# Patient Record
Sex: Female | Born: 1975 | Race: White | Hispanic: No | Marital: Single | Smoking: Former smoker
Health system: Southern US, Community
[De-identification: ages and names within clinical notes are randomized; demographics above are authoritative.]

## PROBLEM LIST (undated history)

## (undated) DIAGNOSIS — E669 Obesity, unspecified: Secondary | ICD-10-CM

## (undated) DIAGNOSIS — I10 Essential (primary) hypertension: Secondary | ICD-10-CM

## (undated) DIAGNOSIS — G542 Cervical root disorders, not elsewhere classified: Secondary | ICD-10-CM

## (undated) DIAGNOSIS — F419 Anxiety disorder, unspecified: Secondary | ICD-10-CM

## (undated) DIAGNOSIS — J449 Chronic obstructive pulmonary disease, unspecified: Secondary | ICD-10-CM

## (undated) DIAGNOSIS — K589 Irritable bowel syndrome without diarrhea: Secondary | ICD-10-CM

## (undated) DIAGNOSIS — N809 Endometriosis, unspecified: Secondary | ICD-10-CM

## (undated) DIAGNOSIS — Z720 Tobacco use: Secondary | ICD-10-CM

## (undated) DIAGNOSIS — N63 Unspecified lump in unspecified breast: Secondary | ICD-10-CM

## (undated) DIAGNOSIS — E785 Hyperlipidemia, unspecified: Secondary | ICD-10-CM

## (undated) DIAGNOSIS — M722 Plantar fascial fibromatosis: Secondary | ICD-10-CM

## (undated) DIAGNOSIS — F329 Major depressive disorder, single episode, unspecified: Secondary | ICD-10-CM

## (undated) DIAGNOSIS — R609 Edema, unspecified: Secondary | ICD-10-CM

## (undated) DIAGNOSIS — F32A Depression, unspecified: Secondary | ICD-10-CM

## (undated) HISTORY — DX: Irritable bowel syndrome, unspecified: K58.9

## (undated) HISTORY — DX: Hyperlipidemia, unspecified: E78.5

## (undated) HISTORY — DX: Essential (primary) hypertension: I10

## (undated) HISTORY — DX: Tobacco use: Z72.0

## (undated) HISTORY — DX: Plantar fascial fibromatosis: M72.2

## (undated) HISTORY — DX: Unspecified lump in unspecified breast: N63.0

## (undated) HISTORY — PX: CERVICAL BIOPSY  W/ LOOP ELECTRODE EXCISION: SUR135

## (undated) HISTORY — DX: Depression, unspecified: F32.A

## (undated) HISTORY — DX: Anxiety disorder, unspecified: F41.9

## (undated) HISTORY — DX: Chronic obstructive pulmonary disease, unspecified: J44.9

## (undated) HISTORY — DX: Obesity, unspecified: E66.9

## (undated) HISTORY — DX: Cervical root disorders, not elsewhere classified: G54.2

## (undated) HISTORY — PX: BREAST BIOPSY: SHX20

## (undated) HISTORY — DX: Major depressive disorder, single episode, unspecified: F32.9

## (undated) HISTORY — DX: Edema, unspecified: R60.9

---

## 2004-06-29 ENCOUNTER — Ambulatory Visit: Payer: Self-pay | Admitting: Unknown Physician Specialty

## 2005-03-26 HISTORY — PX: LEEP: SHX91

## 2005-03-26 HISTORY — PX: COLPOSCOPY: SHX161

## 2005-08-02 ENCOUNTER — Ambulatory Visit: Payer: Self-pay | Admitting: Unknown Physician Specialty

## 2006-02-24 ENCOUNTER — Emergency Department: Payer: Self-pay | Admitting: Emergency Medicine

## 2007-06-13 ENCOUNTER — Ambulatory Visit (HOSPITAL_COMMUNITY): Admission: RE | Admit: 2007-06-13 | Discharge: 2007-06-13 | Payer: Self-pay | Admitting: Obstetrics and Gynecology

## 2011-03-27 DIAGNOSIS — N63 Unspecified lump in unspecified breast: Secondary | ICD-10-CM

## 2011-03-27 DIAGNOSIS — R609 Edema, unspecified: Secondary | ICD-10-CM

## 2011-03-27 HISTORY — DX: Unspecified lump in unspecified breast: N63.0

## 2011-03-27 HISTORY — DX: Edema, unspecified: R60.9

## 2011-12-17 LAB — HM PAP SMEAR

## 2011-12-31 ENCOUNTER — Ambulatory Visit: Payer: Self-pay | Admitting: Obstetrics and Gynecology

## 2012-05-28 ENCOUNTER — Encounter: Payer: Self-pay | Admitting: *Deleted

## 2012-07-21 ENCOUNTER — Ambulatory Visit: Payer: Self-pay | Admitting: General Surgery

## 2012-07-30 ENCOUNTER — Encounter: Payer: Self-pay | Admitting: *Deleted

## 2013-01-05 ENCOUNTER — Ambulatory Visit: Payer: Self-pay | Admitting: Obstetrics and Gynecology

## 2014-01-25 ENCOUNTER — Encounter: Payer: Self-pay | Admitting: *Deleted

## 2014-05-14 ENCOUNTER — Ambulatory Visit (INDEPENDENT_AMBULATORY_CARE_PROVIDER_SITE_OTHER): Payer: BLUE CROSS/BLUE SHIELD | Admitting: Internal Medicine

## 2014-05-14 VITALS — BP 110/74 | HR 67 | Temp 97.3°F | Resp 16 | Ht 68.5 in | Wt 211.0 lb

## 2014-05-14 DIAGNOSIS — R300 Dysuria: Secondary | ICD-10-CM

## 2014-05-14 DIAGNOSIS — N809 Endometriosis, unspecified: Secondary | ICD-10-CM

## 2014-05-14 DIAGNOSIS — R1031 Right lower quadrant pain: Secondary | ICD-10-CM

## 2014-05-14 DIAGNOSIS — Z6831 Body mass index (BMI) 31.0-31.9, adult: Secondary | ICD-10-CM

## 2014-05-14 LAB — POCT CBC
Granulocyte percent: 66.3 %G (ref 37–80)
HCT, POC: 39.9 % (ref 37.7–47.9)
HEMOGLOBIN: 13.5 g/dL (ref 12.2–16.2)
LYMPH, POC: 2.6 (ref 0.6–3.4)
MCH: 29.2 pg (ref 27–31.2)
MCHC: 33.9 g/dL (ref 31.8–35.4)
MCV: 86.1 fL (ref 80–97)
MID (cbc): 0.7 (ref 0–0.9)
MPV: 6.1 fL (ref 0–99.8)
POC GRANULOCYTE: 6.6 (ref 2–6.9)
POC LYMPH %: 26.4 % (ref 10–50)
POC MID %: 7.3 % (ref 0–12)
Platelet Count, POC: 342 10*3/uL (ref 142–424)
RBC: 4.63 M/uL (ref 4.04–5.48)
RDW, POC: 12.2 %
WBC: 9.9 10*3/uL (ref 4.6–10.2)

## 2014-05-14 LAB — POCT URINALYSIS DIPSTICK
Bilirubin, UA: NEGATIVE
Glucose, UA: NEGATIVE
KETONES UA: NEGATIVE
LEUKOCYTES UA: NEGATIVE
NITRITE UA: NEGATIVE
Protein, UA: NEGATIVE
SPEC GRAV UA: 1.01
Urobilinogen, UA: 0.2
pH, UA: 5.5

## 2014-05-14 LAB — POCT UA - MICROSCOPIC ONLY
Bacteria, U Microscopic: NEGATIVE
Casts, Ur, LPF, POC: NEGATIVE
Crystals, Ur, HPF, POC: NEGATIVE
Mucus, UA: NEGATIVE
WBC, UR, HPF, POC: NEGATIVE
YEAST UA: NEGATIVE

## 2014-05-14 LAB — COMPREHENSIVE METABOLIC PANEL
ALBUMIN: 4.5 g/dL (ref 3.5–5.2)
ALT: 16 U/L (ref 0–35)
AST: 12 U/L (ref 0–37)
Alkaline Phosphatase: 37 U/L — ABNORMAL LOW (ref 39–117)
BUN: 12 mg/dL (ref 6–23)
CALCIUM: 9.3 mg/dL (ref 8.4–10.5)
CHLORIDE: 100 meq/L (ref 96–112)
CO2: 27 meq/L (ref 19–32)
CREATININE: 0.7 mg/dL (ref 0.50–1.10)
Glucose, Bld: 97 mg/dL (ref 70–99)
POTASSIUM: 3.8 meq/L (ref 3.5–5.3)
Sodium: 136 mEq/L (ref 135–145)
Total Bilirubin: 0.8 mg/dL (ref 0.2–1.2)
Total Protein: 7.3 g/dL (ref 6.0–8.3)

## 2014-05-14 LAB — POCT URINE PREGNANCY: Preg Test, Ur: NEGATIVE

## 2014-05-14 NOTE — Progress Notes (Signed)
Subjective:    Patient ID: Stefanie Stewart, female    DOB: 1975/06/02, 39 y.o.   MRN: [id number] This chart was scribed for Stefanie Lin, MD by Marti Sleigh, Medical Scribe. This patient was seen in Room 9 and the patient's care was started a 11:05 AM.  Chief Complaint  Patient presents with  . Abdominal Pain    Lower X1week  . Back Pain    Rt side   . Rash    Ongoing for months    HPI HPI Comments: Stefanie Stewart is a 39 y.o. female with a hx of endometriosis who presents to [facility] complaining of squeezing low abdominal pain for the last week. Pt also states that she started having right-sided mid back pain last night. Pt also states she has had severe constipation for the last several months. Pt also states she has the feeling of pressure during and after urination. Pt endorses sleep disturbance associated with her abdominal pain. Pt denies sharp abdominal pain, appetite disturbance, cough, SOB, fever, chills, night sweats, or burning with urination.  Pt states she takes HCTZ for swelling in her legs but hasn't needed this recently because of her weight loss. Pt states she has lost more than thirty pounds in the last year through exercise and eliminating glutin from her diet.   Pt states she is currently trying to get pregnant. Pt states she has taken clomid during this period cycle. Ovulation was 8 days ago. GYN Dr. Willis Modena. Long history of endometriosis. Last child was 61 years old.  Also has a dry patch slightly itchy - right shoulder one month  Review of Systems  Constitutional: Negative for fever and chills.  Gastrointestinal: Positive for abdominal pain.  Genitourinary: Negative for dysuria.  Musculoskeletal: Positive for back pain.       Objective:   Physical Exam  Constitutional: She is oriented to person, place, and time. She appears well-developed and well-nourished. No distress.  HENT:  Head: Normocephalic and atraumatic.  Eyes: Pupils are equal, round, and reactive  to light.  Neck: Neck supple.  Cardiovascular: Normal rate, regular rhythm and normal heart sounds.   Pulmonary/Chest: Effort normal. No respiratory distress.  Abdominal: Soft. Bowel sounds are normal. She exhibits no distension. There is tenderness. There is no guarding.  TTP suprapubic and RLQ. No rebound or masses. Right flank is not tender to percussion.  Musculoskeletal: Normal range of motion. She exhibits no edema.  Neurological: She is alert and oriented to person, place, and time. Coordination normal.  Skin: Skin is warm and dry. She is not diaphoretic.  Scaly 2 cm x 4 cm patch on the right shoulder with no inflammation or vesiculation and no central clearing  Psychiatric: She has a normal mood and affect. Her behavior is normal.  Nursing note and vitals reviewed. BP 110/74 mmHg  Pulse 67  Temp(Src) 97.3 F (36.3 C) (Oral)  Resp 16  Ht 5' 8.5" (1.74 m)  Wt 211 lb (95.709 kg)  BMI 31.61 kg/m2  SpO2 100%  LMP 04/22/2014 Results for orders placed or performed in visit on 05/14/14  POCT UA - Microscopic Only  Result Value Ref Range   WBC, Ur, HPF, POC neg    RBC, urine, microscopic 0-2    Bacteria, U Microscopic neg    Mucus, UA neg    Epithelial cells, urine per micros 2-5    Crystals, Ur, HPF, POC neg    Casts, Ur, LPF, POC neg    Yeast, UA neg  POCT urinalysis dipstick  Result Value Ref Range   Color, UA yellow    Clarity, UA clear    Glucose, UA neg    Bilirubin, UA neg    Ketones, UA neg    Spec Grav, UA 1.010    Blood, UA trace-intact    pH, UA 5.5    Protein, UA neg    Urobilinogen, UA 0.2    Nitrite, UA neg    Leukocytes, UA Negative   POCT urine pregnancy  Result Value Ref Range   Preg Test, Ur Negative   POCT CBC  Result Value Ref Range   WBC 9.9 4.6 - 10.2 K/uL   Lymph, poc 2.6 0.6 - 3.4   POC LYMPH PERCENT 26.4 10 - 50 %L   MID (cbc) 0.7 0 - 0.9   POC MID % 7.3 0 - 12 %M   POC Granulocyte 6.6 2 - 6.9   Granulocyte percent 66.3 37 - 80 %G    RBC 4.63 4.04 - 5.48 M/uL   Hemoglobin 13.5 12.2 - 16.2 g/dL   HCT, POC 39.9 37.7 - 47.9 %   MCV 86.1 80 - 97 fL   MCH, POC 29.2 27 - 31.2 pg   MCHC 33.9 31.8 - 35.4 g/dL   RDW, POC 12.2 %   Platelet Count, POC 342 142 - 424 K/uL   MPV 6.1 0 - 99.8 fL        Assessment & Plan:  Dysuria - Plan: POCT UA - Microscopic Only, POCT urinalysis dipstick=wnl  RLQ abdominal pain - Plan: POCT urine pregnancy=neg, POCT CBC=wnl,  Comprehensive metabolic panel, hCG, quantitative, pregnancy(due to clomid and hx ovul 8 d ago  Endometriosis  BMI 31.0-31.9,adult  Plan-cmet/quant hcg--if neg consider abdominal ultrasound or CT of the abdomen Until then MiraLAX twice a day   I have completed the patient encounter in its entirety as documented by the scribe, with editing by me where necessary. Beren Yniguez P. Laney Pastor, M.D.

## 2014-05-15 ENCOUNTER — Telehealth: Payer: Self-pay | Admitting: *Deleted

## 2014-05-15 LAB — HCG, QUANTITATIVE, PREGNANCY: hCG, Beta Chain, Quant, S: <2 m[IU]/mL

## 2014-05-15 NOTE — Telephone Encounter (Signed)
Pt called to inquire about labs , looks like they have yet to be reviewed, please advise.

## 2014-10-21 ENCOUNTER — Ambulatory Visit (INDEPENDENT_AMBULATORY_CARE_PROVIDER_SITE_OTHER): Payer: Self-pay | Admitting: Family Medicine

## 2014-10-21 VITALS — BP 122/70 | HR 82 | Temp 98.2°F | Resp 18 | Ht 67.5 in | Wt 217.0 lb

## 2014-10-21 DIAGNOSIS — R42 Dizziness and giddiness: Secondary | ICD-10-CM

## 2014-10-21 DIAGNOSIS — R110 Nausea: Secondary | ICD-10-CM

## 2014-10-21 LAB — POCT CBC
GRANULOCYTE PERCENT: 64.5 % (ref 37–80)
HEMATOCRIT: 39.6 % (ref 37.7–47.9)
HEMOGLOBIN: 13.4 g/dL (ref 12.2–16.2)
Lymph, poc: 3.1 (ref 0.6–3.4)
MCH: 29.2 pg (ref 27–31.2)
MCHC: 33.9 g/dL (ref 31.8–35.4)
MCV: 86.2 fL (ref 80–97)
MID (cbc): 0.7 (ref 0–0.9)
MPV: 5.9 fL (ref 0–99.8)
PLATELET COUNT, POC: 338 10*3/uL (ref 142–424)
POC GRANULOCYTE: 7 — AB (ref 2–6.9)
POC LYMPH %: 28.7 % (ref 10–50)
POC MID %: 6.8 % (ref 0–12)
RBC: 4.59 M/uL (ref 4.04–5.48)
RDW, POC: 12.3 %
WBC: 10.9 10*3/uL — AB (ref 4.6–10.2)

## 2014-10-21 LAB — POCT URINALYSIS DIPSTICK
BILIRUBIN UA: NEGATIVE
Glucose, UA: NEGATIVE
Ketones, UA: NEGATIVE
Leukocytes, UA: NEGATIVE
NITRITE UA: NEGATIVE
PH UA: 5.5
PROTEIN UA: NEGATIVE
Spec Grav, UA: <=1.005
UROBILINOGEN UA: 0.2

## 2014-10-21 NOTE — Progress Notes (Signed)
Subjective:    Patient ID: Stefanie Stewart, female    DOB: 02/22/1976, 39 y.o.   MRN: [id number]  HPI This is a pleasant 39 yo female who presents today with 3 day history of nausea, dizziness, bloating, fatigue. Symptoms started with morning waves of nausea. Feels "swimmy headed, swimmy stomach." Nausea and lightheadedness can be separate. Lightheadedness is not positional. Had maybe 10 episodes today. Can not pinpoint triggers. Has slightly decreased appetite, but has eaten normally today (had tuna salad). No change in symptoms with eating. Has been able to do all normal activities. No recent viral illness. No known sick contacts.   Had a heavier than normal period 09/30/14. Has done two cycles of IUI in an attempt to become pregnant. Last cycle last month. Has lost her health insurance and currently has suspended infertility treatments.   Past Medical History  Diagnosis Date  . Lump or mass in breast 2013    left   . Edema 2013    legs   Past Surgical History  Procedure Laterality Date  . Leep  2007  . Cesarean section  1996   Family History  Problem Relation Age of Onset  . Breast cancer Maternal Aunt   . COPD Mother    History  Substance Use Topics  . Smoking status: Current Every Day Smoker -- 1.00 packs/day for 20 years    Types: E-cigarettes  . Smokeless tobacco: Not on file  . Alcohol Use: 1.2 oz/week    2 Standard drinks or equivalent per week   Review of Systems No chest pain or SOB, no fever, no rash, no abdominal pain, some loose stools today, no diarrhea or unusual colored stools.   No breast tenderness. No headache. No weakness. A little fatigued feeling. No dysuria/hematuria/frequency. No polyuria/polyphagia/polydipsia. No myalgias/arthralgias.  Objective:   Physical Exam  Constitutional: She is oriented to person, place, and time. She appears well-developed and well-nourished.  HENT:  Head: Normocephalic and atraumatic.  Right Ear: Tympanic membrane, external  ear and ear canal normal.  Left Ear: Tympanic membrane, external ear and ear canal normal.  Nose: Nose normal.  Mouth/Throat: Oropharynx is clear and moist.  Eyes: Pupils are equal, round, and reactive to light.  Neck: Normal range of motion. Neck supple.  Cardiovascular: Normal rate, regular rhythm and normal heart sounds.   Pulmonary/Chest: Effort normal and breath sounds normal.  Abdominal: Soft. Bowel sounds are normal. She exhibits no distension and no mass. There is no tenderness. There is no rebound and no guarding.  Musculoskeletal: Normal range of motion. She exhibits no edema.  Lymphadenopathy:    She has no cervical adenopathy.  Neurological: She is alert and oriented to person, place, and time.  Skin: Skin is warm and dry.  Psychiatric: She has a normal mood and affect. Her behavior is normal. Judgment and thought content normal.  Vitals reviewed.  BP 122/70 mmHg  Pulse 82  Temp(Src) 98.2 F (36.8 C) (Oral)  Resp 18  Ht 5' 7.5" (1.715 m)  Wt 217 lb (98.431 kg)  BMI 33.47 kg/m2  SpO2 99%  LMP 09/30/2014 Results for orders placed or performed in visit on 10/21/14  POCT CBC  Result Value Ref Range   WBC 10.9 (A) 4.6 - 10.2 K/uL   Lymph, poc 3.1 0.6 - 3.4   POC LYMPH PERCENT 28.7 10 - 50 %L   MID (cbc) 0.7 0 - 0.9   POC MID % 6.8 0 - 12 %M   POC Granulocyte  7.0 (A) 2 - 6.9   Granulocyte percent 64.5 37 - 80 %G   RBC 4.59 4.04 - 5.48 M/uL   Hemoglobin 13.4 12.2 - 16.2 g/dL   HCT, POC 39.6 37.7 - 47.9 %   MCV 86.2 80 - 97 fL   MCH, POC 29.2 27 - 31.2 pg   MCHC 33.9 31.8 - 35.4 g/dL   RDW, POC 12.3 %   Platelet Count, POC 338 142 - 424 K/uL   MPV 5.9 0 - 99.8 fL  POCT urinalysis dipstick  Result Value Ref Range   Color, UA yellow    Clarity, UA clear    Glucose, UA neg    Bilirubin, UA neg    Ketones, UA neg    Spec Grav, UA <=1.005    Blood, UA trace-lysed    pH, UA 5.5    Protein, UA neg    Urobilinogen, UA 0.2    Nitrite, UA neg    Leukocytes, UA  Negative Negative      Assessment & Plan:  1. Dizziness - hCG, quantitative, pregnancy - POCT CBC - POCT urinalysis dipstick  2. Nausea without vomiting - hCG, quantitative, pregnancy - POCT CBC - POCT urinalysis dipstick  - Reviewed CBC and urine dip results  - I will call her tomorrow with her serum HCG level and can send in ondansetron if she is still having nausea.   Clarene Reamer, FNP-BC  [facility]  10/21/2014 9:37 PM

## 2014-10-21 NOTE — Patient Instructions (Signed)
Continue to hydrate well We will call you tomorrow with pregnancy test results- if negative, I can send in medication for nausea

## 2014-10-22 LAB — HCG, QUANTITATIVE, PREGNANCY

## 2014-11-29 ENCOUNTER — Ambulatory Visit (INDEPENDENT_AMBULATORY_CARE_PROVIDER_SITE_OTHER): Payer: Self-pay | Admitting: Urgent Care

## 2014-11-29 VITALS — BP 118/70 | HR 83 | Temp 100.2°F | Resp 16 | Ht 69.0 in | Wt 218.0 lb

## 2014-11-29 DIAGNOSIS — H9202 Otalgia, left ear: Secondary | ICD-10-CM

## 2014-11-29 DIAGNOSIS — H60392 Other infective otitis externa, left ear: Secondary | ICD-10-CM

## 2014-11-29 MED ORDER — NEOMYCIN-POLYMYXIN-HC 1 % OT SOLN: 3.0000 [drp] | OTIC | Status: DC

## 2014-11-29 MED ORDER — TOBRAMYCIN 0.3 % OP SOLN: 1.0000 [drp] | Freq: Four times a day (QID) | OPHTHALMIC | Status: DC

## 2014-11-29 NOTE — Patient Instructions (Addendum)

## 2014-11-29 NOTE — Progress Notes (Signed)
    MRN: [id number] DOB: [birth year]  Subjective:   Stefanie Stewart is a 39 y.o. female presenting for chief complaint of Ear Pain  Reports 3 day history of worsening left ear pain. Has tried peroxide with minimal relief. Denies fever, ear drainage, sinus pain, sinus congestion, cough, sore throat, itchy or watery red eyes. Patient is worried she has an infections and "wants to get rid of this before she goes back to work" and would also like [name] to "drain her ear". She denies history of allergies. Denies any other aggravating or relieving factors, no other questions or concerns.  Stefanie Stewart has a current medication list which includes the following prescription(s): bupropion, cyclobenzaprine, fish oil-omega-3 fatty acids, folic acid, hydrochlorothiazide, multiple vitamins-minerals, and tobramycin. Also has No Known Allergies.  Stefanie Stewart  has a past medical history of Lump or mass in breast (2013) and Edema (2013). Also  has past surgical history that includes LEEP (2007) and Cesarean section (1996).  Objective:   Vitals: BP 118/70 mmHg  Pulse 83  Temp(Src) 100.2 F (37.9 C)  Resp 16  Ht 5\' 9"  (1.753 m)  Wt 218 lb (98.884 kg)  BMI 32.18 kg/m2  SpO2 98%  LMP 11/19/2014  Physical Exam  Constitutional: She is oriented to person, place, and time. She appears well-developed and well-nourished.  HENT:  TM's intact bilaterally, no effusions or erythema. Pain out of proportion to physical exam findings for right auricle and tragus. Nares patent, nasal turbinates pink and moist, nasal passages patent. No sinus tenderness. Oropharynx clear, mucous membranes moist, dentition in good repair.  Eyes: Conjunctivae and EOM are normal. Right eye exhibits no discharge. Left eye exhibits no discharge. No scleral icterus.  Cardiovascular: Normal rate.   Pulmonary/Chest: Effort normal.  Lymphadenopathy:    She has no cervical adenopathy.  Neurological: She is alert and oriented to person, place, and time.  Skin:  Skin is warm and dry. No rash noted. No erythema. No pallor.   Assessment and Plan :   1. Otitis, externa, infective, left 2. Left ear pain - Advised patient that draining her ear would be extremely aggressive for possible otitis externa. It seemed that patient wanted to use antibiotics prophylactically. I prescribed an antibiotic ear drop and refused oral antibiotic course unless she worsens or does not improve over the next 3-4 days. I recommended patient use over-the-counter NSAID treatment for her pain. I also advised that she use Zyrtec for antihistamine properties.  Jaynee Eagles, PA-C [facility] [phone] 11/29/2014 11:18 AM

## 2014-12-01 ENCOUNTER — Telehealth: Payer: Self-pay

## 2014-12-01 MED ORDER — CIPROFLOXACIN HCL 500 MG PO TABS: 500.0000 mg | ORAL_TABLET | Freq: Two times a day (BID) | ORAL | Status: DC

## 2014-12-01 NOTE — Telephone Encounter (Signed)
Patient has had no improvement in her ear pain. She wanted oral antibiotics initially. Her physical exam findings were pretty normal. I had a discussion with her about this and the fact that oral antibiotics are inappropriate for her at this point. But she insists that she wants to "use a strong antibiotic (not amoxicillin) to knock this out". I advised that this may or may not help given her physical exam findings and if she has no improvement, she needs to rtc in 2 days. Continue using ibuprofen or Tylenol for ear pain, Zyrtec for antihistamine properties to try and attempt something for ETD.

## 2014-12-01 NOTE — Telephone Encounter (Signed)
1. Otitis, externa, infective, left 2. Left ear pain - Advised patient that draining her ear would be extremely aggressive for possible otitis externa. It seemed that patient wanted to use antibiotics prophylactically. I prescribed an antibiotic ear drop and refused oral antibiotic course unless she worsens or does not improve over the next 3-4 days. I recommended patient use over-the-counter NSAID treatment for her pain. I also advised that she use Zyrtec for antihistamine properties.  Jaynee Eagles, PA-C [facility] [phone] 11/29/2014 11:18 AM  Pt called stating her ear is still throbbing, more pain, and ear drops are not working. She is unable to RTC because she is cash pay. Can we send in an ABX, she wants something stronger than Amoxicillin. She had to pay 60 dollars for the drops. Please advise.  [place], [place]

## 2014-12-02 ENCOUNTER — Telehealth: Payer: Self-pay

## 2014-12-02 DIAGNOSIS — H9209 Otalgia, unspecified ear: Secondary | ICD-10-CM

## 2014-12-02 NOTE — Telephone Encounter (Signed)
See previous message. Pt would like a referral to ENT. Can we refer. Her hearing is now gone in that ear and very swollen. Pt does not want to RTC, just need a referral. I will try to get her in asap if approved. Thank you.

## 2014-12-02 NOTE — Telephone Encounter (Signed)
PATIENT TALK WITH THE AFTER HOURS ANSWERING SERVICE. PATIENT STATES SHE SAW MARIO MANI ON Monday FOR HER (L) EAR BEING STOPPED UP. SHE STILL CAN NOT HEAR OUT OF IT. BEST PHONE [phone] (CELL)  [place]

## 2014-12-02 NOTE — Telephone Encounter (Signed)
Pt has appt tomorrow

## 2014-12-02 NOTE — Telephone Encounter (Signed)
She should stop Cipro if her ear pain is overwhelming. I placed an urgent referral but she might be better off going to the ED and hopefully they will get an ENT consult. Thank you!

## 2014-12-04 ENCOUNTER — Encounter: Payer: Self-pay | Admitting: Emergency Medicine

## 2014-12-04 ENCOUNTER — Emergency Department
Admission: EM | Admit: 2014-12-04 | Discharge: 2014-12-04 | Disposition: A | Discharge status: Home / Self Care | Payer: Self-pay | Attending: Emergency Medicine | Admitting: Emergency Medicine

## 2014-12-04 DIAGNOSIS — N764 Abscess of vulva: Secondary | ICD-10-CM | POA: Insufficient documentation

## 2014-12-04 DIAGNOSIS — Z792 Long term (current) use of antibiotics: Secondary | ICD-10-CM | POA: Insufficient documentation

## 2014-12-04 DIAGNOSIS — Z79899 Other long term (current) drug therapy: Secondary | ICD-10-CM | POA: Insufficient documentation

## 2014-12-04 DIAGNOSIS — Z720 Tobacco use: Secondary | ICD-10-CM | POA: Insufficient documentation

## 2014-12-04 MED ORDER — HYDROCODONE-ACETAMINOPHEN 5-325 MG PO TABS: ORAL_TABLET | ORAL | Status: DC

## 2014-12-04 MED ORDER — SULFAMETHOXAZOLE-TRIMETHOPRIM 800-160 MG PO TABS: 1.0000 | ORAL_TABLET | Freq: Two times a day (BID) | ORAL | Status: DC

## 2014-12-04 MED ORDER — LIDOCAINE-EPINEPHRINE (PF) 1 %-1:200000 IJ SOLN
10.0000 mL | Freq: Once | INTRAMUSCULAR | Status: AC
  Administered 2014-12-04: 10 mL
  Filled 2014-12-04: qty 30

## 2014-12-04 MED ORDER — IBUPROFEN 800 MG PO TABS
800.0000 mg | ORAL_TABLET | Freq: Once | ORAL | Status: AC
  Administered 2014-12-04: 800 mg via ORAL
  Filled 2014-12-04: qty 1

## 2014-12-04 NOTE — ED Notes (Signed)
Pt stated iodoform came out while using bathroom. PA aware. Patient states she is not able to tolerate iodoform packing. PA ok.

## 2014-12-04 NOTE — Discharge Instructions (Signed)
Abscess Care After An abscess (also called a boil or furuncle) is an infected area that contains a collection of pus. Signs and symptoms of an abscess include pain, tenderness, redness, or hardness, or you may feel a moveable soft area under your skin. An abscess can occur anywhere in the body. The infection may spread to surrounding tissues causing cellulitis. A cut (incision) by the surgeon was made over your abscess and the pus was drained out. Gauze may have been packed into the space to provide a drain that will allow the cavity to heal from the inside outwards. The boil may be painful for 5 to 7 days. Most people with a boil do not have high fevers. Your abscess, if seen early, may not have localized, and may not have been lanced. If not, another appointment may be required for this if it does not get better on its own or with medications. HOME CARE INSTRUCTIONS   Only take over-the-counter or prescription medicines for pain, discomfort, or fever as directed by your caregiver.  When you bathe, soak and then remove gauze or iodoform packs at least daily or as directed by your caregiver. You may then wash the wound gently with mild soapy water. Repack with gauze or do as your caregiver directs. SEEK IMMEDIATE MEDICAL CARE IF:   You develop increased pain, swelling, redness, drainage, or bleeding in the wound site.  You develop signs of generalized infection including muscle aches, chills, fever, or a general ill feeling.  An oral temperature above 102 F (38.9 C) develops, not controlled by medication. See your caregiver for a recheck if you develop any of the symptoms described above. If medications (antibiotics) were prescribed, take them as directed. Document Released: 09/28/2004 Document Revised: 06/04/2011 Document Reviewed: 05/26/2007 [facility] Patient Information 2015 [place], [place]. This information is not intended to replace advice given to you by your health care provider. Make sure  you discuss any questions you have with your health care provider.  Incision and Drainage Incision and drainage is a procedure in which a sac-like structure (cystic structure) is opened and drained. The area to be drained usually contains material such as pus, fluid, or blood.  LET YOUR CAREGIVER KNOW ABOUT:   Allergies to medicine.  Medicines taken, including vitamins, herbs, eyedrops, over-the-counter medicines, and creams.  Use of steroids (by mouth or creams).  Previous problems with anesthetics or numbing medicines.  History of bleeding problems or blood clots.  Previous surgery.  Other health problems, including diabetes and kidney problems.  Possibility of pregnancy, if this applies. RISKS AND COMPLICATIONS  Pain.  Bleeding.  Scarring.  Infection. BEFORE THE PROCEDURE  You may need to have an ultrasound or other imaging tests to see how large or deep your cystic structure is. Blood tests may also be used to determine if you have an infection or how severe the infection is. You may need to have a tetanus shot. PROCEDURE  The affected area is cleaned with a cleaning fluid. The cyst area will then be numbed with a medicine (local anesthetic). A small incision will be made in the cystic structure. A syringe or catheter may be used to drain the contents of the cystic structure, or the contents may be squeezed out. The area will then be flushed with a cleansing solution. After cleansing the area, it is often gently packed with a gauze or another wound dressing. Once it is packed, it will be covered with gauze and tape or some other type of  wound dressing. AFTER THE PROCEDURE   Often, you will be allowed to go home right after the procedure.  You may be given antibiotic medicine to prevent or heal an infection.  If the area was packed with gauze or some other wound dressing, you will likely need to come back in 1 to 2 days to get it removed.  The area should heal in about  14 days. Document Released: 09/05/2000 Document Revised: 09/11/2011 Document Reviewed: 05/07/2011 [facility] Patient Information 2015 [place], [place]. This information is not intended to replace advice given to you by your health care provider. Make sure you discuss any questions you have with your health care provider.

## 2014-12-04 NOTE — ED Notes (Signed)
Xylocaine obtained and given to pa

## 2014-12-04 NOTE — ED Provider Notes (Signed)
[facility] Emergency Department Provider Note  ____________________________________________  Time seen: Approximately 3:11 PM  I have reviewed the triage vital signs and the nursing notes.   HISTORY  Chief Complaint Abscess   HPI DELESA KAWA is a 39 y.o. female is here today with complaint of right labial swelling and pain for 6 days. She states she's been doing warm compresses frequently and also sitz baths without any relief. She denies any fever or chills. She states she has had these before very sports for body that usually in the suprapubic area. Currently she is taking Cipro for an ear infection and thought this would help. Currently patient's pain is 7 out of 10. She is requesting ibuprofen due to the fact that narcotics causes lots of constipation and also because she is driving.   Past Medical History  Diagnosis Date  . Lump or mass in breast 2013    left   . Edema 2013    legs    Patient Active Problem List   Diagnosis Date Noted  . Endometriosis 05/14/2014  . BMI 31.0-31.9,adult 05/14/2014    Past Surgical History  Procedure Laterality Date  . Leep  2007  . Cesarean section  1996    Current Outpatient Rx  Name  Route  Sig  Dispense  Refill  . buPROPion (WELLBUTRIN SR) 100 MG 12 hr tablet   Oral   Take 100 mg by mouth 2 (two) times daily.         . ciprofloxacin (CIPRO) 500 MG tablet   Oral   Take 1 tablet (500 mg total) by mouth 2 (two) times daily.   14 tablet   0   . cyclobenzaprine (FLEXERIL) 10 MG tablet      TK 1 T PO HS      2   . fish oil-omega-3 fatty acids 1000 MG capsule   Oral   Take 2 g by mouth daily.         . folic acid (FOLVITE) 1 MG tablet   Oral   Take 1 mg by mouth daily.         . hydrochlorothiazide (HYDRODIURIL) 25 MG tablet   Oral   Take 25 mg by mouth daily.         [name] HYDROcodone-acetaminophen (NORCO/VICODIN) 5-325 MG per tablet      TAKE 1-2 TABLETS EVERY 4-6 HOURS PRN PAIN    30 tablet   0   . Multiple Vitamins-Minerals (MULTIVITAMIN & MINERAL PO)   Oral   Take by mouth.         . NEOMYCIN-POLYMYXIN-HYDROCORTISONE (CORTISPORIN) 1 % SOLN otic solution   Left Ear   Place 3 drops into the left ear every 4 (four) hours.   10 mL   0   . sulfamethoxazole-trimethoprim (BACTRIM DS,SEPTRA DS) 800-160 MG per tablet   Oral   Take 1 tablet by mouth 2 (two) times daily.   20 tablet   0     Allergies Review of patient's allergies indicates no known allergies.  Family History  Problem Relation Age of Onset  . Breast cancer Maternal Aunt   . COPD Mother     Social History Social History  Substance Use Topics  . Smoking status: Current Every Day Smoker -- 1.00 packs/day for 20 years    Types: E-cigarettes  . Smokeless tobacco: None  . Alcohol Use: 1.2 oz/week    2 Standard drinks or equivalent per week    Review of  Systems Constitutional: No fever/chills Eyes: No visual changes. Cardiovascular: Denies chest pain. Respiratory: Denies shortness of breath. Gastrointestinal: No abdominal pain.  No nausea, no vomiting.  Genitourinary: Negative for dysuria. Right labial pain 6 days Musculoskeletal: Negative for back pain. Skin: Negative for rash. Neurological: Negative for headaches, focal weakness or numbness.  10-point ROS otherwise negative.  ____________________________________________   PHYSICAL EXAM:  VITAL SIGNS: ED Triage Vitals  Enc Vitals Group     BP 12/04/14 1429 128/93 mmHg     Pulse Rate 12/04/14 1429 102     Resp 12/04/14 1429 18     Temp 12/04/14 1429 98.3 F (36.8 C)     Temp Source 12/04/14 1429 Oral     SpO2 12/04/14 1429 96 %     Weight 12/04/14 1416 217 lb (98.431 kg)     Height 12/04/14 1416 5\' 8"  (1.727 m)     Head Cir --      Peak Flow --      Pain Score 12/04/14 1416 7     Pain Loc --      Pain Edu? --      Excl. in [place]? --     Constitutional: Alert and oriented. Well appearing and in no acute  distress. Eyes: Conjunctivae are normal. PERRL. EOMI. Head: Atraumatic. Nose: No congestion/rhinnorhea. Neck: No stridor.   Respiratory: Normal respiratory effort.  No retractions.  Gastrointestinal: Soft and nontender. No distention. Genitourinary: Left labia with abscess. Mild erythema present and early pustule forming. No drainage was noted. There is no definite fluctuant area, area is extremely hard and markedly tender. Musculoskeletal: No lower extremity tenderness nor edema.  No joint effusions. Neurologic:  Normal speech and language. No gross focal neurologic deficits are appreciated. No gait instability. Skin:  Skin is warm, dry and intact. No rash noted. See genitourinary exam Psychiatric: Mood and affect are normal. Speech and behavior are normal.  ____________________________________________   LABS (all labs ordered are listed, but only abnormal results are displayed)  Labs Reviewed - No data to display  PROCEDURES  Procedure(s) performed: INCISION AND DRAINAGE Performed by: Johnn Hai Consent: Verbal consent obtained. Risks and benefits: risks, benefits and alternatives were discussed Type: abscess  Body area: Left labia  Anesthesia: local infiltration  Incision was made with a scalpel.  Local anesthetic: lidocaine 1 % with epinephrine  Anesthetic total: 4 ml  Complexity: complex Blunt dissection to break up loculations  Drainage: purulent mixed with blood  Drainage amount: Minimal   Packing material: 1/4 in iodoform gauze  Patient tolerance: Patient tolerated the procedure well with no immediate complications. patient did not tolerate procedure extremely well. She continued to complain about pain however we were unable to give any oral or IM pain medication since there was no driver. She states there is no one who can come pick her up.      Critical Care performed: No  ____________________________________________   INITIAL IMPRESSION /  ASSESSMENT AND PLAN / ED COURSE  Pertinent labs & imaging results that were available during my care of the patient were reviewed by me and considered in my medical decision making (see chart for details).  Patient was told to return to the emergency room for packing removal in 2 days. She was started on Septra DS for 10 days and Norco as needed for pain and patient states she may return to the emergency room for packing removal or she may remove it herself.  She does have a gynecologist in [place]  and was advised to get an appointment if there is not any improvement.   FINAL CLINICAL IMPRESSION(S) / ED DIAGNOSES  Final diagnoses:  Left genital labial abscess      Johnn Hai, PA-C 12/04/14 2057  Harvest Dark, MD 12/05/14 [phone]

## 2014-12-04 NOTE — ED Notes (Signed)
Pt c/o swollen red area to the groin for the past 6 days, no drainage.

## 2015-01-28 ENCOUNTER — Other Ambulatory Visit: Payer: Self-pay | Admitting: Unknown Physician Specialty

## 2015-05-31 ENCOUNTER — Other Ambulatory Visit: Payer: Self-pay | Admitting: Unknown Physician Specialty

## 2015-05-31 NOTE — Telephone Encounter (Signed)
Pt needs check further refills 

## 2015-07-11 DIAGNOSIS — E782 Mixed hyperlipidemia: Secondary | ICD-10-CM

## 2015-07-11 DIAGNOSIS — F17210 Nicotine dependence, cigarettes, uncomplicated: Secondary | ICD-10-CM | POA: Insufficient documentation

## 2015-07-11 DIAGNOSIS — M722 Plantar fascial fibromatosis: Secondary | ICD-10-CM

## 2015-07-11 DIAGNOSIS — K589 Irritable bowel syndrome without diarrhea: Secondary | ICD-10-CM

## 2015-07-11 DIAGNOSIS — I10 Essential (primary) hypertension: Secondary | ICD-10-CM

## 2015-07-11 DIAGNOSIS — Z6828 Body mass index (BMI) 28.0-28.9, adult: Secondary | ICD-10-CM | POA: Insufficient documentation

## 2015-07-11 DIAGNOSIS — R609 Edema, unspecified: Secondary | ICD-10-CM

## 2015-07-11 DIAGNOSIS — J449 Chronic obstructive pulmonary disease, unspecified: Secondary | ICD-10-CM

## 2015-07-11 DIAGNOSIS — Z638 Other specified problems related to primary support group: Secondary | ICD-10-CM | POA: Insufficient documentation

## 2015-07-11 DIAGNOSIS — G542 Cervical root disorders, not elsewhere classified: Secondary | ICD-10-CM

## 2015-07-11 DIAGNOSIS — E669 Obesity, unspecified: Secondary | ICD-10-CM

## 2015-07-11 DIAGNOSIS — M791 Myalgia, unspecified site: Secondary | ICD-10-CM

## 2015-07-11 DIAGNOSIS — R7301 Impaired fasting glucose: Secondary | ICD-10-CM

## 2015-07-11 DIAGNOSIS — Z720 Tobacco use: Secondary | ICD-10-CM

## 2015-07-11 DIAGNOSIS — F411 Generalized anxiety disorder: Secondary | ICD-10-CM

## 2015-07-11 DIAGNOSIS — I152 Hypertension secondary to endocrine disorders: Secondary | ICD-10-CM | POA: Insufficient documentation

## 2015-07-11 DIAGNOSIS — F32A Depression, unspecified: Secondary | ICD-10-CM

## 2015-07-11 DIAGNOSIS — F329 Major depressive disorder, single episode, unspecified: Secondary | ICD-10-CM | POA: Insufficient documentation

## 2015-07-11 DIAGNOSIS — E785 Hyperlipidemia, unspecified: Secondary | ICD-10-CM | POA: Insufficient documentation

## 2015-07-11 NOTE — Telephone Encounter (Signed)
Pt scheduled for 07/13/15 @ 8:45am for medication follow up. Thanks.

## 2015-07-13 ENCOUNTER — Ambulatory Visit: Payer: Self-pay | Admitting: Unknown Physician Specialty

## 2015-07-22 ENCOUNTER — Ambulatory Visit (INDEPENDENT_AMBULATORY_CARE_PROVIDER_SITE_OTHER): Payer: Self-pay | Admitting: Unknown Physician Specialty

## 2015-07-22 ENCOUNTER — Encounter: Payer: Self-pay | Admitting: Unknown Physician Specialty

## 2015-07-22 VITALS — BP 116/77 | HR 73 | Temp 97.3°F | Ht 68.0 in | Wt 229.0 lb

## 2015-07-22 DIAGNOSIS — M531 Cervicobrachial syndrome: Secondary | ICD-10-CM

## 2015-07-22 DIAGNOSIS — I10 Essential (primary) hypertension: Secondary | ICD-10-CM

## 2015-07-22 DIAGNOSIS — F32A Depression, unspecified: Secondary | ICD-10-CM

## 2015-07-22 DIAGNOSIS — F329 Major depressive disorder, single episode, unspecified: Secondary | ICD-10-CM

## 2015-07-22 DIAGNOSIS — G542 Cervical root disorders, not elsewhere classified: Secondary | ICD-10-CM

## 2015-07-22 DIAGNOSIS — R252 Cramp and spasm: Secondary | ICD-10-CM

## 2015-07-22 MED ORDER — BUPROPION HCL ER (SR) 100 MG PO TB12: 100.0000 mg | ORAL_TABLET | Freq: Every day | ORAL | Status: DC

## 2015-07-22 MED ORDER — CYCLOBENZAPRINE HCL 10 MG PO TABS: 10.0000 mg | ORAL_TABLET | Freq: Every day | ORAL | Status: DC | PRN

## 2015-07-22 MED ORDER — HYDROCHLOROTHIAZIDE 25 MG PO TABS: 25.0000 mg | ORAL_TABLET | Freq: Every day | ORAL | Status: DC | PRN

## 2015-07-22 NOTE — Assessment & Plan Note (Signed)
Check CMP and recommended Magnesium supplements

## 2015-07-22 NOTE — Assessment & Plan Note (Addendum)
Out of Wellbutrin and wants to get back on.  Also planning on exercising again

## 2015-07-22 NOTE — Progress Notes (Signed)
BP 116/77 mmHg  Pulse 73  Temp(Src) 97.3 F (36.3 C)  Ht 5\' 8"  (1.727 m)  Wt 229 lb (103.874 kg)  BMI 34.83 kg/m2  SpO2 96%  LMP 07/13/2015 (Exact Date)   Subjective:    Patient ID: Stefanie Stewart, female    DOB: 1975/07/30, 40 y.o.   MRN: [id number]  HPI: Stefanie DINALLO is a 40 y.o. female  Chief Complaint  Patient presents with  . Medication Refill    pt states she needs her wellbutrin and cyclobenzaprine refilled. Patient states she has been having leg cramps very bad and wonders if her potassium is low. Wants to talk to Malachy Mood about it   Hypertension Using medications without difficulty Average home: checking at work once a month and it's a little higher there  No problems or lightheadedness No chest pain with exertion or shortness of breath No Edema Having leg cramps feet and lower legs  Depression Out of Wellbutrin and wants to get back on.   Myofacial pain Takes flexeril at night on occasion    Relevant past medical, surgical, family and social history reviewed and updated as indicated. Interim medical history since our last visit reviewed. Allergies and medications reviewed and updated.  Review of Systems  Per HPI unless specifically indicated above     Objective:    BP 116/77 mmHg  Pulse 73  Temp(Src) 97.3 F (36.3 C)  Ht 5\' 8"  (1.727 m)  Wt 229 lb (103.874 kg)  BMI 34.83 kg/m2  SpO2 96%  LMP 07/13/2015 (Exact Date)  Wt Readings from Last 3 Encounters:  07/22/15 229 lb (103.874 kg)  07/19/14 213 lb (96.616 kg)  12/04/14 218 lb (98.884 kg)    Physical Exam  Constitutional: She is oriented to person, place, and time. She appears well-developed and well-nourished. No distress.  HENT:  Head: Normocephalic and atraumatic.  Eyes: Conjunctivae and lids are normal. Right eye exhibits no discharge. Left eye exhibits no discharge. No scleral icterus.  Neck: Normal range of motion. Neck supple. No JVD present. Carotid bruit is not present.   Cardiovascular: Normal rate, regular rhythm and normal heart sounds.   Pulmonary/Chest: Effort normal and breath sounds normal.  Abdominal: Normal appearance. There is no splenomegaly or hepatomegaly.  Musculoskeletal: Normal range of motion.  Neurological: She is alert and oriented to person, place, and time.  Skin: Skin is warm, dry and intact. No rash noted. No pallor.  Psychiatric: She has a normal mood and affect. Her behavior is normal. Judgment and thought content normal.    Results for orders placed or performed in visit on 07/11/15  HM PAP SMEAR  Result Value Ref Range   HM Pap smear from PP       Assessment & Plan:   Problem List Items Addressed This Visit      Unprioritized   Cervical syndrome    Takes Flexeril on occasion at night      Depression - Primary    Out of Wellbutrin and wants to get back on.  Also planning on exercising again       Relevant Medications   buPROPion (WELLBUTRIN SR) 100 MG 12 hr tablet   Hypertension    Stable, continue present medications.        Relevant Medications   hydrochlorothiazide (HYDRODIURIL) 25 MG tablet   Other Relevant Orders   Comprehensive metabolic panel   Leg cramps    Check CMP and recommended Magnesium supplements  Follow up plan: Return in about 6 months (around 01/21/2016) for needs lipid panel  at that time.

## 2015-07-22 NOTE — Assessment & Plan Note (Signed)
Takes Flexeril on occasion at night

## 2015-07-22 NOTE — Assessment & Plan Note (Signed)
Stable, continue present medications.   

## 2015-07-22 NOTE — Patient Instructions (Signed)
Take Magnesium slupplement.  I take Magnesium Citrate.  Avoid Magnesium Oxide.

## 2015-07-23 LAB — COMPREHENSIVE METABOLIC PANEL
A/G RATIO: 1.6 (ref 1.2–2.2)
ALBUMIN: 4.4 g/dL (ref 3.5–5.5)
ALT: 22 IU/L (ref 0–32)
AST: 16 IU/L (ref 0–40)
Alkaline Phosphatase: 49 IU/L (ref 39–117)
BILIRUBIN TOTAL: 0.4 mg/dL (ref 0.0–1.2)
BUN / CREAT RATIO: 22 (ref 9–23)
BUN: 15 mg/dL (ref 6–24)
CALCIUM: 9.4 mg/dL (ref 8.7–10.2)
CO2: 23 mmol/L (ref 18–29)
Chloride: 100 mmol/L (ref 96–106)
Creatinine, Ser: 0.67 mg/dL (ref 0.57–1.00)
GFR calc non Af Amer: 110 mL/min/{1.73_m2} (ref >59)
GFR, EST AFRICAN AMERICAN: 126 mL/min/{1.73_m2} (ref >59)
Globulin, Total: 2.8 g/dL (ref 1.5–4.5)
Glucose: 85 mg/dL (ref 65–99)
Potassium: 4.8 mmol/L (ref 3.5–5.2)
SODIUM: 139 mmol/L (ref 134–144)
TOTAL PROTEIN: 7.2 g/dL (ref 6.0–8.5)

## 2015-07-25 ENCOUNTER — Encounter: Payer: Self-pay | Admitting: Unknown Physician Specialty

## 2015-07-25 NOTE — Progress Notes (Signed)
Quick Note:  Normal labs. Patient notified by letter. ______ 

## 2015-09-29 ENCOUNTER — Other Ambulatory Visit: Payer: Self-pay | Admitting: Unknown Physician Specialty

## 2016-01-27 ENCOUNTER — Other Ambulatory Visit: Payer: Self-pay | Admitting: Obstetrics and Gynecology

## 2016-01-27 ENCOUNTER — Ambulatory Visit: Payer: Self-pay | Admitting: Unknown Physician Specialty

## 2016-01-27 DIAGNOSIS — R928 Other abnormal and inconclusive findings on diagnostic imaging of breast: Secondary | ICD-10-CM

## 2016-01-31 ENCOUNTER — Telehealth (HOSPITAL_COMMUNITY): Payer: Self-pay | Admitting: *Deleted

## 2016-01-31 NOTE — Telephone Encounter (Signed)
Telephoned patient at home number and patient stated did not have time to do talk and do application. She stated had appointment on Friday to do mammogram. Patient hung up.

## 2016-02-03 ENCOUNTER — Other Ambulatory Visit: Payer: Self-pay

## 2016-02-03 ENCOUNTER — Ambulatory Visit
Admission: RE | Admit: 2016-02-03 | Discharge: 2016-02-03 | Disposition: A | Discharge status: Home / Self Care | Payer: Self-pay | Source: Ambulatory Visit | Attending: Obstetrics and Gynecology | Admitting: Obstetrics and Gynecology

## 2016-02-03 ENCOUNTER — Other Ambulatory Visit: Payer: Self-pay | Admitting: Obstetrics and Gynecology

## 2016-02-03 ENCOUNTER — Ambulatory Visit (HOSPITAL_COMMUNITY)
Admission: RE | Admit: 2016-02-03 | Discharge: 2016-02-03 | Disposition: A | Discharge status: Home / Self Care | Payer: Self-pay | Source: Ambulatory Visit | Attending: Obstetrics and Gynecology | Admitting: Obstetrics and Gynecology

## 2016-02-03 ENCOUNTER — Encounter (HOSPITAL_COMMUNITY): Payer: Self-pay

## 2016-02-03 VITALS — BP 120/82 | Temp 98.4°F | Ht 67.5 in | Wt 220.2 lb

## 2016-02-03 DIAGNOSIS — R928 Other abnormal and inconclusive findings on diagnostic imaging of breast: Secondary | ICD-10-CM

## 2016-02-03 DIAGNOSIS — N6320 Unspecified lump in the left breast, unspecified quadrant: Secondary | ICD-10-CM

## 2016-02-03 DIAGNOSIS — Z1239 Encounter for other screening for malignant neoplasm of breast: Secondary | ICD-10-CM

## 2016-02-03 HISTORY — DX: Endometriosis, unspecified: N80.9

## 2016-02-03 HISTORY — PX: BREAST BIOPSY: SHX20

## 2016-02-03 NOTE — Patient Instructions (Addendum)
Explained breast self awareness to Stefanie Stewart. Patient did not need a Pap smear today due to last Pap smear was in September 2015 per patient. Let her know BCCCP will cover Pap smears every 3 years unless has a history of abnormal Pap smears. Reminded patient her next Pap smear will be due next year and that can have completed in [place]. Told her she can call Sabrina to schedule. Referred patient to the [facility] for a left breast biopsy per recommendation. Appointment scheduled for Friday, February 03, 2016 at 0925. Let [facility] know the [facility] will follow up with her within the next week with results to breast biopsy by phone.Discussed smoking cessation with patient. Referred patient to the [facility] Quitline and gave resources to free smoking cessation classes at [facility]. Stefanie Stewart verbalized understanding.  Stclair Szymborski, Arvil Chaco, RN 11:16 AM

## 2016-02-03 NOTE — Progress Notes (Addendum)
Patient referred to [facility] by the [facility] due to recommending a left breast biopsy. Left breast diagnostic mammogram completed 02/03/2016 and screening mammogram completed at Dr. Paulene Floor office 01/20/2016.  Pap Smear:  Pap smear not completed today. Last Pap smear was in September 2015 at Dr. Paulene Floor office and normal per patient. Per patient has a history of two abnormal Pap smears in 1996 that a repeat Pap smear was completed and between 2008-2010 that a colposcopy and LEEP were completed. Patient stated she has had at least three normal Pap smears since last abnormal Pap smear. No Pap smear results are in EPIC.  Physical exam: Breasts Breasts symmetrical. No skin abnormalities bilateral breasts. No nipple retraction bilateral breasts. No nipple discharge bilateral breasts. No lymphadenopathy. No lumps palpated bilateral breasts. No complaints of pain or tenderness on exam. Referred patient to the [facility] for a left breast biopsy per recommendation. Appointment scheduled for Friday, February 03, 2016 at 0925.        Pelvic/Bimanual No Pap smear completed today since last Pap smear was in September 2015 per patient. Pap smear not indicated per BCCCP guidelines.   Smoking History: Patient is a current smoker. Discussed smoking cessation with patient. Referred patient to the [facility] Quitline and gave resources to free smoking cessation classes at [facility].  Patient Navigation: Patient education provided. Access to services provided for patient through [facility] program.

## 2016-02-03 NOTE — Addendum Note (Signed)
Encounter addended by: Loletta Parish, RN on: 02/03/2016  4:10 PM<BR>    Actions taken: Sign clinical note

## 2016-02-06 ENCOUNTER — Encounter (HOSPITAL_COMMUNITY): Payer: Self-pay | Admitting: *Deleted

## 2016-05-18 ENCOUNTER — Encounter: Payer: Self-pay | Admitting: Unknown Physician Specialty

## 2016-05-18 ENCOUNTER — Ambulatory Visit (INDEPENDENT_AMBULATORY_CARE_PROVIDER_SITE_OTHER): Payer: Self-pay | Admitting: Unknown Physician Specialty

## 2016-05-18 DIAGNOSIS — F321 Major depressive disorder, single episode, moderate: Secondary | ICD-10-CM

## 2016-05-18 MED ORDER — SERTRALINE HCL 50 MG PO TABS: 50.0000 mg | ORAL_TABLET | Freq: Every day | ORAL | 1 refills | Status: DC

## 2016-05-18 NOTE — Assessment & Plan Note (Signed)
Start Zoloft at 50 mg.  Discussed side-effects

## 2016-05-18 NOTE — Progress Notes (Signed)
BP 131/83 (BP Location: Left Arm, Patient Position: Sitting, Cuff Size: Large)   Pulse 92   Temp 99 F (37.2 C)   Wt 225 lb (102.1 kg)   LMP 04/30/2016 (Approximate)   SpO2 95%   BMI 34.72 kg/m    Subjective:    Patient ID: Stefanie Stewart, female    DOB: 05-27-1975, 41 y.o.   MRN: [id number]  HPI: Stefanie Stewart is a 41 y.o. female  Chief Complaint  Patient presents with  . Anxiety    pt states she has been having trouble with anxiety and panic attacks, states her father passed away and mother has dementia    Depression/Anxiety Her father died last fall.  She has had a lot of stress with care giving with her parents with dementia.  Pt states she would like something to help.  States she is "pretty miserable."    Depression screen [facility] 2/9 05/18/2016 07/22/2015 11/29/2014 10/21/2014  Decreased Interest 2 1 0 0  Down, Depressed, Hopeless 2 0 0 0  PHQ - 2 Score 4 1 0 0  Altered sleeping 1 2 - -  Tired, decreased energy 2 1 - -  Change in appetite 3 3 - -  Feeling bad or failure about yourself  2 1 - -  Trouble concentrating 2 0 - -  Moving slowly or fidgety/restless 0 0 - -  Suicidal thoughts 0 0 - -  PHQ-9 Score 14 8 - -    Relevant past medical, surgical, family and social history reviewed and updated as indicated. Interim medical history since our last visit reviewed. Allergies and medications reviewed and updated.  Review of Systems  Constitutional: Negative.   HENT: Negative.   Eyes: Negative.   Respiratory: Negative.   Cardiovascular: Negative.   Gastrointestinal: Negative.   Endocrine: Negative.   Genitourinary: Negative.   Musculoskeletal: Negative.   Skin: Negative.   Allergic/Immunologic: Negative.   Neurological: Negative.   Hematological: Negative.     Per HPI unless specifically indicated above     Objective:    BP 131/83 (BP Location: Left Arm, Patient Position: Sitting, Cuff Size: Large)   Pulse 92   Temp 99 F (37.2 C)   Wt 225 lb (102.1 kg)    LMP 04/30/2016 (Approximate)   SpO2 95%   BMI 34.72 kg/m   Wt Readings from Last 3 Encounters:  05/18/16 225 lb (102.1 kg)  02/03/16 220 lb 3.2 oz (99.9 kg)  07/22/15 229 lb (103.9 kg)    Physical Exam  Constitutional: She is oriented to person, place, and time. She appears well-developed and well-nourished. No distress.  HENT:  Head: Normocephalic and atraumatic.  Eyes: Conjunctivae and lids are normal. Right eye exhibits no discharge. Left eye exhibits no discharge. No scleral icterus.  Neck: Normal range of motion. Neck supple. No JVD present. Carotid bruit is not present.  Cardiovascular: Normal rate, regular rhythm and normal heart sounds.   Pulmonary/Chest: Effort normal and breath sounds normal.  Abdominal: Normal appearance. There is no splenomegaly or hepatomegaly.  Musculoskeletal: Normal range of motion.  Neurological: She is alert and oriented to person, place, and time.  Skin: Skin is warm, dry and intact. No rash noted. No pallor.  Psychiatric: She has a normal mood and affect. Her behavior is normal. Judgment and thought content normal.      Assessment & Plan:   Problem List Items Addressed This Visit      Unprioritized   Depression  Start Zoloft at 50 mg.  Discussed side-effects       Relevant Medications   sertraline (ZOLOFT) 50 MG tablet       Follow up plan: Return in about 4 weeks (around 06/15/2016).

## 2016-06-25 ENCOUNTER — Encounter: Payer: Self-pay | Admitting: Unknown Physician Specialty

## 2016-06-25 ENCOUNTER — Ambulatory Visit (INDEPENDENT_AMBULATORY_CARE_PROVIDER_SITE_OTHER): Payer: Self-pay | Admitting: Unknown Physician Specialty

## 2016-06-25 DIAGNOSIS — F321 Major depressive disorder, single episode, moderate: Secondary | ICD-10-CM

## 2016-06-25 MED ORDER — SERTRALINE HCL 50 MG PO TABS: 50.0000 mg | ORAL_TABLET | Freq: Every day | ORAL | 1 refills | Status: DC

## 2016-06-25 NOTE — Assessment & Plan Note (Signed)
Stable, continue present medications.   

## 2016-06-25 NOTE — Progress Notes (Signed)
BP 127/82 (BP Location: Left Arm, Patient Position: Sitting, Cuff Size: Large)   Pulse 82   Temp 98.7 F (37.1 C)   Ht 5\' 8"  (1.727 m)   Wt 228 lb 12.8 oz (103.8 kg)   SpO2 94%   BMI 34.79 kg/m    Subjective:    Patient ID: Stefanie Stewart, female    DOB: 06/07/1975, 41 y.o.   MRN: [id number]  HPI: Stefanie Stewart is a 41 y.o. female  Chief Complaint  Patient presents with  . Depression    4 week f/up   She states she is doing much better with her anxiety since starting on Sertraline.  Feels it helps a lot.  Concerned with long-term effects of both medicine.    Depression screen [facility] 2/9 06/25/2016 05/18/2016 07/22/2015 11/29/2014 10/21/2014  Decreased Interest 1 2 1  0 0  Down, Depressed, Hopeless 1 2 0 0 0  PHQ - 2 Score 2 4 1  0 0  Altered sleeping 0 1 2 - -  Tired, decreased energy 0 2 1 - -  Change in appetite 2 3 3  - -  Feeling bad or failure about yourself  0 2 1 - -  Trouble concentrating 0 2 0 - -  Moving slowly or fidgety/restless 0 0 0 - -  Suicidal thoughts 0 0 0 - -  PHQ-9 Score 4 14 8  - -    Relevant past medical, surgical, family and social history reviewed and updated as indicated. Interim medical history since our last visit reviewed. Allergies and medications reviewed and updated.  Review of Systems  Per HPI unless specifically indicated above     Objective:    BP 127/82 (BP Location: Left Arm, Patient Position: Sitting, Cuff Size: Large)   Pulse 82   Temp 98.7 F (37.1 C)   Ht 5\' 8"  (1.727 m)   Wt 228 lb 12.8 oz (103.8 kg)   SpO2 94%   BMI 34.79 kg/m   Wt Readings from Last 3 Encounters:  06/25/16 228 lb 12.8 oz (103.8 kg)  05/18/16 225 lb (102.1 kg)  02/03/16 220 lb 3.2 oz (99.9 kg)    Physical Exam  Constitutional: She is oriented to person, place, and time. She appears well-developed and well-nourished. No distress.  HENT:  Head: Normocephalic and atraumatic.  Eyes: Conjunctivae and lids are normal. Right eye exhibits no discharge. Left eye  exhibits no discharge. No scleral icterus.  Neck: Normal range of motion. Neck supple. No JVD present. Carotid bruit is not present.  Cardiovascular: Normal rate, regular rhythm and normal heart sounds.   Pulmonary/Chest: Effort normal and breath sounds normal.  Abdominal: Normal appearance. There is no splenomegaly or hepatomegaly.  Musculoskeletal: Normal range of motion.  Neurological: She is alert and oriented to person, place, and time.  Skin: Skin is warm, dry and intact. No rash noted. No pallor.  Psychiatric: She has a normal mood and affect. Her behavior is normal. Judgment and thought content normal.    Results for orders placed or performed in visit on 07/22/15  Comprehensive metabolic panel  Result Value Ref Range   Glucose 85 65 - 99 mg/dL   BUN 15 6 - 24 mg/dL   Creatinine, Ser 0.67 0.57 - 1.00 mg/dL   GFR calc non Af Amer 110 >59 mL/min/1.73   GFR calc Af Amer 126 >59 mL/min/1.73   BUN/Creatinine Ratio 22 9 - 23   Sodium 139 134 - 144 mmol/L   Potassium 4.8 3.5 -  5.2 mmol/L   Chloride 100 96 - 106 mmol/L   CO2 23 18 - 29 mmol/L   Calcium 9.4 8.7 - 10.2 mg/dL   Total Protein 7.2 6.0 - 8.5 g/dL   Albumin 4.4 3.5 - 5.5 g/dL   Globulin, Total 2.8 1.5 - 4.5 g/dL   Albumin/Globulin Ratio 1.6 1.2 - 2.2   Bilirubin Total 0.4 0.0 - 1.2 mg/dL   Alkaline Phosphatase 49 39 - 117 IU/L   AST 16 0 - 40 IU/L   ALT 22 0 - 32 IU/L      Assessment & Plan:   Problem List Items Addressed This Visit      Unprioritized   Depression    Stable, continue present medications.        Relevant Medications   sertraline (ZOLOFT) 50 MG tablet       Follow up plan: Return in about 6 months (around 12/25/2016) for cholesterol, BP, and depression.

## 2016-07-03 ENCOUNTER — Telehealth: Payer: Self-pay | Admitting: Unknown Physician Specialty

## 2016-07-03 NOTE — Telephone Encounter (Signed)
Nikki with [employer] needs someone to call her with an update on records of patient.   [phone]  Nikki--Ref# [id number]  Thanks

## 2016-07-03 NOTE — Telephone Encounter (Signed)
Called and spoke to [place] with [place] (gave him the reference number) and he asked about information on the patient. (see last message. Pt called and requested to have last OV sent to [place]). I told Payden that I had sent the OV note like the patient requested but he asked if I could send it to him directly because they had not received anything yet. He stated that his direct fax number was [phone].

## 2016-07-03 NOTE — Telephone Encounter (Signed)
OV note faxed to [place].

## 2016-07-03 NOTE — Telephone Encounter (Signed)
Patient needs for [name] to fax her last office note to [place] [place] [facility] [place] if you need to speak with her [phone]   Thanks

## 2016-07-04 NOTE — Telephone Encounter (Signed)
OV note faxed to [place].

## 2016-07-13 ENCOUNTER — Other Ambulatory Visit: Payer: Self-pay | Admitting: Unknown Physician Specialty

## 2016-08-15 ENCOUNTER — Other Ambulatory Visit: Payer: Self-pay | Admitting: Unknown Physician Specialty

## 2016-10-18 ENCOUNTER — Other Ambulatory Visit: Payer: Self-pay | Admitting: Family Medicine

## 2016-11-22 ENCOUNTER — Other Ambulatory Visit: Payer: Self-pay | Admitting: Unknown Physician Specialty

## 2016-11-22 NOTE — Telephone Encounter (Signed)
Your patient 

## 2016-12-20 ENCOUNTER — Ambulatory Visit (HOSPITAL_COMMUNITY)
Admission: RE | Admit: 2016-12-20 | Discharge: 2016-12-20 | Disposition: A | Discharge status: Home / Self Care | Payer: Self-pay | Source: Ambulatory Visit | Attending: Obstetrics and Gynecology | Admitting: Obstetrics and Gynecology

## 2016-12-20 ENCOUNTER — Encounter (HOSPITAL_COMMUNITY): Payer: Self-pay

## 2016-12-20 VITALS — BP 130/86 | Ht 68.0 in | Wt 232.5 lb

## 2016-12-20 DIAGNOSIS — N898 Other specified noninflammatory disorders of vagina: Secondary | ICD-10-CM

## 2016-12-20 DIAGNOSIS — Z01419 Encounter for gynecological examination (general) (routine) without abnormal findings: Secondary | ICD-10-CM

## 2016-12-20 NOTE — Progress Notes (Signed)
No complaints today.   Pap Smear: Pap smear completed today. Last Pap smear was in September 2015 at Dr. Paulene Floor office and normal per patient. Per patient has a history of two abnormal Pap smears in 1996 that a repeat Pap smear was completed and between 2008-2010 that a colposcopy and LEEP were completed. Patient stated she has had at least three normal Pap smears since last abnormal Pap smear. No Pap smear results are in EPIC.  Pelvic/Bimanual   Ext Genitalia No lesions, no swelling and no discharge observed on external genitalia.         Vagina Vagina pink and normal texture. No lesions and thick white discharge observed in vagina. Wet prep completed.       Cervix Cervix is present. Cervix pink and of normal texture. Thick white discharge observed on cervical os.    Uterus Uterus is present and palpable. Uterus in normal position and normal size.        Adnexae Bilateral ovaries present and palpable. No tenderness on palpation.          Rectovaginal No rectal exam completed today since patient had no rectal complaints. No skin abnormalities observed on exam.    Smoking History: Patient is a current smoker. Discussed smoking cessation with patient. Referred patient to the [facility] Quitline and gave resources to free smoking cessation classes at [facility].  Patient Navigation: Patient education provided. Access to services provided for patient through [facility] program.

## 2016-12-20 NOTE — Patient Instructions (Addendum)
Explained to Stefanie Stewart that [employer] will cover Pap smears and HPV typing every 5 years unless has a history of abnormal Pap smears. Scheduled patient appointment with [facility] on Thursday, February 07, 2017 at 1500 for clinical breast exam and mammogram. Patient aware of appointment and will be there. Let patient know will follow up with her within the next week with results of Pap smear and wet prep by phone. Discussed smoking cessation with patient. Referred patient to the [facility] Quitline and gave resources to free smoking cessation classes at [facility]. Stefanie Stewart verbalized understanding.  Dilraj Killgore, Arvil Chaco, RN 3:48 PM

## 2016-12-21 ENCOUNTER — Encounter (HOSPITAL_COMMUNITY): Payer: Self-pay | Admitting: *Deleted

## 2016-12-24 LAB — CYTOLOGY - PAP
Bacterial vaginitis: NEGATIVE
CANDIDA VAGINITIS: NEGATIVE
Diagnosis: NEGATIVE
HPV: NOT DETECTED
Trichomonas: NEGATIVE

## 2016-12-25 ENCOUNTER — Telehealth (HOSPITAL_COMMUNITY): Payer: Self-pay | Admitting: *Deleted

## 2016-12-25 NOTE — Telephone Encounter (Signed)
Telephoned patient at home number and left message to return call to [facility] 

## 2016-12-25 NOTE — Telephone Encounter (Signed)
Telephoned patient at home number and advised of negative pap smear results. HPV was negative. Next pap smear due in five years. Patient voiced understanding.

## 2016-12-28 ENCOUNTER — Encounter: Payer: Self-pay | Admitting: Unknown Physician Specialty

## 2016-12-28 ENCOUNTER — Ambulatory Visit (INDEPENDENT_AMBULATORY_CARE_PROVIDER_SITE_OTHER): Payer: Self-pay | Admitting: Unknown Physician Specialty

## 2016-12-28 DIAGNOSIS — F321 Major depressive disorder, single episode, moderate: Secondary | ICD-10-CM

## 2016-12-28 DIAGNOSIS — R252 Cramp and spasm: Secondary | ICD-10-CM

## 2016-12-28 MED ORDER — CYCLOBENZAPRINE HCL 10 MG PO TABS: 10.0000 mg | ORAL_TABLET | Freq: Every day | ORAL | 0 refills | Status: DC

## 2016-12-28 MED ORDER — BUPROPION HCL ER (SR) 100 MG PO TB12: 100.0000 mg | ORAL_TABLET | Freq: Two times a day (BID) | ORAL | 3 refills | Status: DC

## 2016-12-28 MED ORDER — SERTRALINE HCL 50 MG PO TABS: 50.0000 mg | ORAL_TABLET | Freq: Every day | ORAL | 1 refills | Status: DC

## 2016-12-28 MED ORDER — HYDROCHLOROTHIAZIDE 25 MG PO TABS: 25.0000 mg | ORAL_TABLET | Freq: Every day | ORAL | 1 refills | Status: DC | PRN

## 2016-12-28 NOTE — Assessment & Plan Note (Addendum)
Stable, continue present medications. Discussed caregiving options for her mother

## 2016-12-28 NOTE — Progress Notes (Signed)
BP 114/77   Pulse 70   Temp (!) 97 F (36.1 C)   Ht 5' 8.1" (1.73 m)   Wt 228 lb 11.2 oz (103.7 kg)   LMP 12/26/2016 (Approximate)   SpO2 96%   BMI 34.67 kg/m    Subjective:    Patient ID: Stefanie Stewart, female    DOB: 1975-06-23, 41 y.o.   MRN: [id number]  HPI: CHIANTI Stewart is a 41 y.o. female  Chief Complaint  Patient presents with  . Depression   Depression  Pt is here for f/u of anxiety and depression.  Pt feels like she is doing well considering her stressors.    Depression screen [facility] 2/9 12/28/2016 06/25/2016 05/18/2016 07/22/2015 11/29/2014  Decreased Interest 1 1 2 1  0  Down, Depressed, Hopeless 1 1 2  0 0  PHQ - 2 Score 2 2 4 1  0  Altered sleeping 0 0 1 2 -  Tired, decreased energy 1 0 2 1 -  Change in appetite 3 2 3 3  -  Feeling bad or failure about yourself  1 0 2 1 -  Trouble concentrating 1 0 2 0 -  Moving slowly or fidgety/restless 0 0 0 0 -  Suicidal thoughts 0 0 0 0 -  PHQ-9 Score 8 4 14 8  -   Relevant past medical, surgical, family and social history reviewed and updated as indicated. Interim medical history since our last visit reviewed. Allergies and medications reviewed and updated.  Review of Systems  Per HPI unless specifically indicated above     Objective:    BP 114/77   Pulse 70   Temp (!) 97 F (36.1 C)   Ht 5' 8.1" (1.73 m)   Wt 228 lb 11.2 oz (103.7 kg)   LMP 12/26/2016 (Approximate)   SpO2 96%   BMI 34.67 kg/m   Wt Readings from Last 3 Encounters:  12/28/16 228 lb 11.2 oz (103.7 kg)  12/20/16 232 lb 8 oz (105.5 kg)  06/25/16 228 lb 12.8 oz (103.8 kg)    Physical Exam  Constitutional: She is oriented to person, place, and time. She appears well-developed and well-nourished. No distress.  HENT:  Head: Normocephalic and atraumatic.  Eyes: Conjunctivae and lids are normal. Right eye exhibits no discharge. Left eye exhibits no discharge. No scleral icterus.  Cardiovascular: Normal rate.   Pulmonary/Chest: Effort normal.  Abdominal:  Normal appearance. There is no splenomegaly or hepatomegaly.  Musculoskeletal: Normal range of motion.  Neurological: She is alert and oriented to person, place, and time.  Skin: Skin is intact. No rash noted. No pallor.  Psychiatric: She has a normal mood and affect. Her behavior is normal. Judgment and thought content normal.    Results for orders placed or performed during the hospital encounter of 12/20/16  Cytology - PAP  Result Value Ref Range   Adequacy      Satisfactory for evaluation  endocervical/transformation zone component PRESENT.   Diagnosis      NEGATIVE FOR INTRAEPITHELIAL LESIONS OR MALIGNANCY.   Bacterial vaginitis Negative for Bacterial Vaginitis Microorganisms    Candida vaginitis Negative for Candida species    HPV NOT DETECTED    Trichomonas Negative    Material Submitted CervicoVaginal Pap [ThinPrep Imaged]       Assessment & Plan:   Problem List Items Addressed This Visit      Unprioritized   Depression    Stable, continue present medications. Discussed caregiving options for her mother  Relevant Medications   buPROPion (WELLBUTRIN SR) 100 MG 12 hr tablet   sertraline (ZOLOFT) 50 MG tablet   Leg cramps    Refill Flexeril          Follow up plan: Return in about 6 months (around 06/28/2017) for physical.

## 2016-12-28 NOTE — Assessment & Plan Note (Signed)
Refill Flexeril

## 2017-01-18 ENCOUNTER — Other Ambulatory Visit: Payer: Self-pay | Admitting: Obstetrics and Gynecology

## 2017-01-18 DIAGNOSIS — Z1231 Encounter for screening mammogram for malignant neoplasm of breast: Secondary | ICD-10-CM

## 2017-02-07 ENCOUNTER — Encounter (HOSPITAL_COMMUNITY): Payer: Self-pay

## 2017-02-07 ENCOUNTER — Ambulatory Visit
Admission: RE | Admit: 2017-02-07 | Discharge: 2017-02-07 | Disposition: A | Discharge status: Home / Self Care | Payer: No Typology Code available for payment source | Source: Ambulatory Visit | Attending: Obstetrics and Gynecology | Admitting: Obstetrics and Gynecology

## 2017-02-07 ENCOUNTER — Ambulatory Visit (HOSPITAL_COMMUNITY)
Admission: RE | Admit: 2017-02-07 | Discharge: 2017-02-07 | Disposition: A | Discharge status: Home / Self Care | Payer: Self-pay | Source: Ambulatory Visit | Attending: Obstetrics and Gynecology | Admitting: Obstetrics and Gynecology

## 2017-02-07 VITALS — BP 124/80 | Ht 68.0 in | Wt 235.4 lb

## 2017-02-07 DIAGNOSIS — Z1239 Encounter for other screening for malignant neoplasm of breast: Secondary | ICD-10-CM

## 2017-02-07 DIAGNOSIS — Z1231 Encounter for screening mammogram for malignant neoplasm of breast: Secondary | ICD-10-CM

## 2017-02-07 NOTE — Patient Instructions (Signed)
Explained breast self awareness with [name]. Patient did not need a Pap smear today due to last Pap smear was 12/20/2016. Let her know [facility] will cover Pap smears and HPV typing every 5 years unless has a history of abnormal Pap smears. Referred patient to the [facility] for a screening mammogram. Appointment scheduled for Thursday, February 07, 2017 at 1540. Let [facility] know [facility] will follow up with her within the next couple weeks with results of mammogram by letter or phone. [name] verbalized understanding.  [place], Arvil Chaco, RN 2:57 PM

## 2017-02-07 NOTE — Progress Notes (Signed)
No complaints today.   Pap Smear: Pap smear not completed today. Last Pap smear was 12/20/2016 at [facility] and normal with negative HPV. Per patient has a history of two abnormal Pap smears in 1996 that a repeat Pap smear was completed and between 2008-2010 that a colposcopy and LEEP were completed. Patient stated she has had at least three normal Pap smears since last abnormal Pap smear. No Pap smear results are in EPIC.  Physical exam: Breasts Breasts symmetrical. No skin abnormalities bilateral breasts. No nipple retraction bilateral breasts. No nipple discharge bilateral breasts. No lymphadenopathy. No lumps palpated bilateral breasts. No complaints of pain or tenderness on exam. Referred patient to the [facility] for a screening mammogram. Appointment scheduled for Thursday, February 07, 2017 at 1540.       Pelvic/Bimanual No Pap smear completed today since last Pap smear and HPV typing was 12/20/2016. Pap smear not indicated per [facility] guidelines.   Smoking History: Patient is a current e-cigarette smoker. Discussed smoking cessation with patient. Referred patient to the [facility] Quitline and gave resources to free smoking cessation classes at [facility].  Patient Navigation: Patient education provided. Access to services provided for patient through [facility] program.

## 2017-02-08 ENCOUNTER — Encounter (HOSPITAL_COMMUNITY): Payer: Self-pay | Admitting: *Deleted

## 2017-02-28 ENCOUNTER — Encounter (HOSPITAL_COMMUNITY): Payer: Self-pay | Admitting: Emergency Medicine

## 2017-02-28 ENCOUNTER — Emergency Department (HOSPITAL_COMMUNITY)
Admission: EM | Admit: 2017-02-28 | Discharge: 2017-02-28 | Disposition: A | Discharge status: Home / Self Care | Payer: No Typology Code available for payment source | Attending: Emergency Medicine | Admitting: Emergency Medicine

## 2017-02-28 DIAGNOSIS — Z87891 Personal history of nicotine dependence: Secondary | ICD-10-CM | POA: Insufficient documentation

## 2017-02-28 DIAGNOSIS — Z23 Encounter for immunization: Secondary | ICD-10-CM | POA: Insufficient documentation

## 2017-02-28 DIAGNOSIS — T23252A Burn of second degree of left palm, initial encounter: Secondary | ICD-10-CM | POA: Insufficient documentation

## 2017-02-28 DIAGNOSIS — Y999 Unspecified external cause status: Secondary | ICD-10-CM | POA: Insufficient documentation

## 2017-02-28 DIAGNOSIS — I10 Essential (primary) hypertension: Secondary | ICD-10-CM | POA: Insufficient documentation

## 2017-02-28 DIAGNOSIS — Y92017 Garden or yard in single-family (private) house as the place of occurrence of the external cause: Secondary | ICD-10-CM | POA: Insufficient documentation

## 2017-02-28 DIAGNOSIS — Z79899 Other long term (current) drug therapy: Secondary | ICD-10-CM | POA: Insufficient documentation

## 2017-02-28 DIAGNOSIS — Y93H9 Activity, other involving exterior property and land maintenance, building and construction: Secondary | ICD-10-CM | POA: Insufficient documentation

## 2017-02-28 DIAGNOSIS — X030XXA Exposure to flames in controlled fire, not in building or structure, initial encounter: Secondary | ICD-10-CM | POA: Insufficient documentation

## 2017-02-28 DIAGNOSIS — J449 Chronic obstructive pulmonary disease, unspecified: Secondary | ICD-10-CM | POA: Insufficient documentation

## 2017-02-28 DIAGNOSIS — T23202A Burn of second degree of left hand, unspecified site, initial encounter: Secondary | ICD-10-CM

## 2017-02-28 MED ORDER — LIDOCAINE HCL 2 % EX GEL
1.0000 "application " | Freq: Once | CUTANEOUS | Status: AC
  Administered 2017-02-28: 1 via TOPICAL

## 2017-02-28 MED ORDER — TETANUS-DIPHTH-ACELL PERTUSSIS 5-2.5-18.5 LF-MCG/0.5 IM SUSP
0.5000 mL | Freq: Once | INTRAMUSCULAR | Status: AC
Start: 2017-02-28 — End: 2017-02-28
  Administered 2017-02-28: 0.5 mL via INTRAMUSCULAR
  Filled 2017-02-28: qty 0.5

## 2017-02-28 MED ORDER — SILVER SULFADIAZINE 1 % EX CREA
TOPICAL_CREAM | Freq: Once | CUTANEOUS | Status: AC
  Administered 2017-02-28: 1 via TOPICAL
  Filled 2017-02-28: qty 50

## 2017-02-28 MED ORDER — LIDOCAINE HCL 2 % EX GEL
CUTANEOUS | Status: AC
  Administered 2017-02-28: 1 via TOPICAL
  Filled 2017-02-28: qty 11

## 2017-02-28 NOTE — Discharge Instructions (Signed)
Change the dressing every 12 hours. Take tylenol and ibuprofen as needed for pain. Call Dr. Levell July office to schedule follow up for tomorrow.return here for any problems.
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
## 2017-06-28 ENCOUNTER — Encounter: Payer: Self-pay | Admitting: Unknown Physician Specialty

## 2017-08-05 ENCOUNTER — Ambulatory Visit: Payer: Self-pay | Admitting: *Deleted

## 2017-08-05 NOTE — Telephone Encounter (Signed)
Routing to provider, FYI.  

## 2017-08-05 NOTE — Telephone Encounter (Signed)
Thrusday- patient worked- leg swelling. Patient got dizzy- Friday and Saturday- she was so bad that she thought she was going to faint. Patient is eating and drinking.She states she has gained a lot of weight. Patient just wants her provider to know what is going on so that when she comes to her appointment on Friday these things can be addressed. Advised patient we may need to bring her in sooner- patient is very reluctant to miss work- she does not have any days she can miss. Patient reports she has no health insurance and will not go to hospital or call 911 if she has symptoms.  Reason for Disposition . [1] MODERATE dizziness (e.g., interferes with normal activities) AND [2] has NOT been evaluated by physician for this  (Exception: dizziness caused by heat exposure, sudden standing, or poor fluid intake)  Answer Assessment - Initial Assessment Questions 1. DESCRIPTION: "Describe your dizziness."     Felt like head was spinning inside- couldn't move 2. LIGHTHEADED: "Do you feel lightheaded?" (e.g., somewhat faint, woozy, weak upon standing)     Patient is not feeling dizzy now- patient states she does not feel herself now 3. VERTIGO: "Do you feel like either you or the room is spinning or tilting?" (i.e. vertigo)     maybe 4. SEVERITY: "How bad is it?"  "Do you feel like you are going to faint?" "Can you stand and walk?"   - MILD - walking normally   - MODERATE - interferes with normal activities (e.g., work, school)    - SEVERE - unable to stand, requires support to walk, feels like passing out now.      Patient is caregiver for many people- moderate 5. ONSET:  "When did the dizziness begin?"     Friday- patient did not feel good Thursday- she did have swelling in her leg 6. AGGRAVATING FACTORS: "Does anything make it worse?" (e.g., standing, change in head position)     Weight gain, exertion- patient states she doesn't feel good- has excessive sweating spells 7. HEART RATE: "Can you tell me  your heart rate?" "How many beats in 15 seconds?"  (Note: not all patients can do this)       Not sure- patient does not pay attention 8. CAUSE: "What do you think is causing the dizziness?"     Not sure 9. RECURRENT SYMPTOM: "Have you had dizziness before?" If so, ask: "When was the last time?" "What happened that time?"     This started last week, patient has brain fog- she can't remember 10. OTHER SYMPTOMS: "Do you have any other symptoms?" (e.g., fever, chest pain, vomiting, diarrhea, bleeding)       Nauseated- not feeling good-  11. PREGNANCY: "Is there any chance you are pregnant?" "When was your last menstrual period?"       LMP- 5/8 very heavy cycles  Protocols used: DIZZINESS [facility]

## 2017-08-09 ENCOUNTER — Ambulatory Visit (INDEPENDENT_AMBULATORY_CARE_PROVIDER_SITE_OTHER): Payer: Self-pay | Admitting: Unknown Physician Specialty

## 2017-08-09 ENCOUNTER — Encounter: Payer: Self-pay | Admitting: Unknown Physician Specialty

## 2017-08-09 VITALS — BP 136/83 | HR 86 | Temp 98.6°F | Ht 68.0 in | Wt 249.0 lb

## 2017-08-09 DIAGNOSIS — I10 Essential (primary) hypertension: Secondary | ICD-10-CM

## 2017-08-09 DIAGNOSIS — F321 Major depressive disorder, single episode, moderate: Secondary | ICD-10-CM

## 2017-08-09 DIAGNOSIS — Z0000 Encounter for general adult medical examination without abnormal findings: Secondary | ICD-10-CM

## 2017-08-09 DIAGNOSIS — B070 Plantar wart: Secondary | ICD-10-CM

## 2017-08-09 DIAGNOSIS — R42 Dizziness and giddiness: Secondary | ICD-10-CM

## 2017-08-09 DIAGNOSIS — Z720 Tobacco use: Secondary | ICD-10-CM

## 2017-08-09 DIAGNOSIS — Z6837 Body mass index (BMI) 37.0-37.9, adult: Secondary | ICD-10-CM

## 2017-08-09 LAB — UA/M W/RFLX CULTURE, ROUTINE
BILIRUBIN UA: NEGATIVE
Glucose, UA: NEGATIVE
Ketones, UA: NEGATIVE
Leukocytes, UA: NEGATIVE
NITRITE UA: NEGATIVE
PH UA: 7 (ref 5.0–7.5)
Specific Gravity, UA: 1.015 (ref 1.005–1.030)
UUROB: 0.2 mg/dL (ref 0.2–1.0)

## 2017-08-09 LAB — MICROSCOPIC EXAMINATION: WBC UA: NONE SEEN /HPF (ref 0–5)

## 2017-08-09 MED ORDER — BUPROPION HCL ER (XL) 150 MG PO TB24: 150.0000 mg | ORAL_TABLET | Freq: Every day | ORAL | 1 refills | Status: DC

## 2017-08-09 NOTE — Assessment & Plan Note (Signed)
Refer to ENT for further management

## 2017-08-09 NOTE — Assessment & Plan Note (Addendum)
Non-therapeutic doses of Wellbutrin.  Increase Wellbutrin to 150 mg XL

## 2017-08-09 NOTE — Addendum Note (Signed)
Addended by: Kathrine Haddock on: 08/09/2017 03:23 PM   Modules accepted: Orders

## 2017-08-09 NOTE — Assessment & Plan Note (Signed)
Still vaping.  Discussed health risk

## 2017-08-09 NOTE — Assessment & Plan Note (Signed)
Use duct tape.  Pare down with [employer], place duct tape over it for 6 days. Leave off overnight.  Repeat process the next day

## 2017-08-09 NOTE — Assessment & Plan Note (Signed)
Stable, continue present medications.   

## 2017-08-09 NOTE — Progress Notes (Addendum)
BP 136/83   Pulse 86   Temp 98.6 F (37 C) (Oral)   Ht 5\' 8"  (1.727 m)   Wt 249 lb (112.9 kg)   SpO2 98%   BMI 37.86 kg/m    Subjective:    Patient ID: Stefanie Stewart, female    DOB: 03/14/1976, 42 y.o.   MRN: [id number]  HPI: Stefanie Stewart is a 42 y.o. female  Chief Complaint  Patient presents with  . Annual Exam   States she put on a lot of weight lately.  She has had some dizzyness and wonders if it has something to do with her ears.  Had a bad dizzy spell while at the gas station.  Further episodes of dizziness intermittently in the last week but not as bad.  States the it was a spinning sensation.  States the left ear "feels funny."   Hypertension Using medications without difficulty Average home BPs Not checking  No problems or lightheadedness No chest pain with exertion or shortness of breath No Edema   She has had multiple stressors with the death of her dog and a good friend.  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    Social History   Socioeconomic History  . Marital status: Single    Spouse name: Not on file  . Number of children: Not on file  . Years of education: Not on file  . Highest education level: Not on file  Occupational History  . Not on file  Social Needs  . Financial resource strain: Not on file  . Food insecurity:    Worry: Not on file    Inability: Not on file  . Transportation needs:    Medical: Not on file    Non-medical: Not on file  Tobacco Use  . Smoking status: Former Smoker    Packs/day: 0.00    Years: 20.00    Pack years: 0.00  . Smokeless tobacco: Never Used  Substance and Sexual Activity  . Alcohol use: Yes    Alcohol/week: 1.2 oz    Types: 2 Standard drinks or equivalent per week    Comment: on occasion  . Drug use: No  . Sexual activity: Not Currently    Birth control/protection: None  Lifestyle  . Physical activity:    Days per week: 0 days    Minutes per session: 0 min  . Stress: To some extent  Relationships    . Social connections:    Talks on phone: More than three times a week    Gets together: Once a week    Attends religious service: Never    Active member of club or organization: No    Attends meetings of clubs or organizations: Never    Relationship status: Divorced  . Intimate partner violence:    Fear of current or ex partner: No    Emotionally abused: No    Physically abused: No    Forced sexual activity: No  Other Topics Concern  . Not on file  Social History Narrative  . Not on file   Family History  Adopted: Yes  Problem Relation Age of Onset  . COPD Mother   . Breast cancer Neg Hx    Past Medical History:  Diagnosis Date  . Anxiety   . Cervical syndrome   . COPD (chronic obstructive pulmonary disease) ([place])   . Depression   . Edema 2013   legs  . Endometriosis   . Hyperlipidemia   . Hypertension   .  IBS (irritable bowel syndrome)   . Lump or mass in breast 2013   left   . Obesity   . Plantar fibromatosis   . Tobacco abuse    Past Surgical History:  Procedure Laterality Date  . BREAST BIOPSY Right   . CERVICAL BIOPSY  W/ LOOP ELECTRODE EXCISION    . CESAREAN SECTION  1996  . COLPOSCOPY  2007  . LEEP  2007   Relevant past medical, surgical, family and social history reviewed and updated as indicated. Interim medical history since our last visit reviewed. Allergies and medications reviewed and updated.  Review of Systems  Constitutional: Negative.   HENT: Negative.   Respiratory: Negative.   Cardiovascular: Negative.   Gastrointestinal: Negative.   Genitourinary: Negative.   Musculoskeletal: Negative.   Neurological:       Complaining of "brain fog."  Psychiatric/Behavioral: Negative.     Per HPI unless specifically indicated above     Objective:    BP 136/83   Pulse 86   Temp 98.6 F (37 C) (Oral)   Ht 5\' 8"  (1.727 m)   Wt 249 lb (112.9 kg)   SpO2 98%   BMI 37.86 kg/m   Wt Readings from Last 3 Encounters:  08/09/17 249 lb (112.9  kg)  02/07/17 235 lb 6.4 oz (106.8 kg)  12/28/16 228 lb 11.2 oz (103.7 kg)    Physical Exam  Constitutional: She is oriented to person, place, and time. She appears well-developed and well-nourished.  HENT:  Head: Normocephalic and atraumatic.  Eyes: Pupils are equal, round, and reactive to light. Right eye exhibits no discharge. Left eye exhibits no discharge. No scleral icterus.  Neck: Normal range of motion. Neck supple. Carotid bruit is not present. No thyromegaly present.  Cardiovascular: Normal rate, regular rhythm and normal heart sounds. Exam reveals no gallop and no friction rub.  No murmur heard. Pulmonary/Chest: Effort normal and breath sounds normal. No respiratory distress. She has no wheezes. She has no rales. No breast tenderness or discharge.  Abdominal: Soft. Bowel sounds are normal. There is no tenderness. There is no rebound.  Genitourinary: No breast tenderness or discharge.  Musculoskeletal: Normal range of motion.  Lymphadenopathy:    She has no cervical adenopathy.  Neurological: She is alert and oriented to person, place, and time.  Skin: Skin is warm, dry and intact. No rash noted.  Wart bottom or left wart  Psychiatric: She has a normal mood and affect. Her speech is normal and behavior is normal. Judgment and thought content normal. Cognition and memory are normal.       Assessment & Plan:   Problem List Items Addressed This Visit      Unprioritized   Depression    Non-therapeutic doses of Wellbutrin.  Increase Wellbutrin to 150 mg XL      Relevant Medications   buPROPion (WELLBUTRIN XL) 150 MG 24 hr tablet   Hypertension    Stable, continue present medications.        Obesity    Discussed diet and exercise.  Planning on joining weight watchers      Plantar wart - Primary    Use duct tape.  Pare down with [employer], place duct tape over it for 6 days. Leave off overnight.  Repeat process the next day        Tobacco abuse    Still  vaping.  Discussed health risk      Vertigo    Refer to ENT for further management  Relevant Orders   Ambulatory referral to ENT    Other Visit Diagnoses    Annual physical exam       Relevant Orders   CBC with Differential/Platelet   Comprehensive metabolic panel   Lipid Panel w/o Chol/HDL Ratio   UA/M w/rflx Culture, Routine   TSH      HM: Refusing breast exam HIV refused Pap due 2021  Follow up plan: Return in about 6 weeks (around 09/20/2017).

## 2017-08-09 NOTE — Patient Instructions (Addendum)
[facility] [employer]  Use duct tape.  Pare down with [employer], place duct tape over it for 6 days. Leave off overnight.  Repeat process the next day

## 2017-08-09 NOTE — Assessment & Plan Note (Addendum)
Discussed diet and exercise.  Planning on joining [employer]

## 2017-08-10 LAB — CBC WITH DIFFERENTIAL/PLATELET
BASOS ABS: 0 10*3/uL (ref 0.0–0.2)
BASOS: 0 %
EOS (ABSOLUTE): 0.1 10*3/uL (ref 0.0–0.4)
Eos: 1 %
Hematocrit: 38.9 % (ref 34.0–46.6)
Hemoglobin: 12.7 g/dL (ref 11.1–15.9)
Immature Grans (Abs): 0 10*3/uL (ref 0.0–0.1)
Immature Granulocytes: 0 %
LYMPHS ABS: 2.2 10*3/uL (ref 0.7–3.1)
Lymphs: 29 %
MCH: 28.8 pg (ref 26.6–33.0)
MCHC: 32.6 g/dL (ref 31.5–35.7)
MCV: 88 fL (ref 79–97)
Monocytes Absolute: 0.5 10*3/uL (ref 0.1–0.9)
Monocytes: 6 %
NEUTROS ABS: 4.7 10*3/uL (ref 1.4–7.0)
Neutrophils: 64 %
PLATELETS: 346 10*3/uL (ref 150–379)
RBC: 4.41 x10E6/uL (ref 3.77–5.28)
RDW: 13.3 % (ref 12.3–15.4)
WBC: 7.5 10*3/uL (ref 3.4–10.8)

## 2017-08-10 LAB — LIPID PANEL W/O CHOL/HDL RATIO
CHOLESTEROL TOTAL: 247 mg/dL — AB (ref 100–199)
HDL: 32 mg/dL — ABNORMAL LOW (ref >39)
LDL Calculated: 162 mg/dL — ABNORMAL HIGH (ref 0–99)
Triglycerides: 265 mg/dL — ABNORMAL HIGH (ref 0–149)
VLDL CHOLESTEROL CAL: 53 mg/dL — AB (ref 5–40)

## 2017-08-10 LAB — COMPREHENSIVE METABOLIC PANEL
A/G RATIO: 1.6 (ref 1.2–2.2)
ALBUMIN: 4.6 g/dL (ref 3.5–5.5)
ALK PHOS: 49 IU/L (ref 39–117)
ALT: 53 IU/L — AB (ref 0–32)
AST: 32 IU/L (ref 0–40)
BILIRUBIN TOTAL: 0.4 mg/dL (ref 0.0–1.2)
BUN/Creatinine Ratio: 16 (ref 9–23)
BUN: 12 mg/dL (ref 6–24)
CHLORIDE: 103 mmol/L (ref 96–106)
CO2: 21 mmol/L (ref 20–29)
Calcium: 9.3 mg/dL (ref 8.7–10.2)
Creatinine, Ser: 0.76 mg/dL (ref 0.57–1.00)
GFR calc Af Amer: 111 mL/min/{1.73_m2} (ref >59)
GFR calc non Af Amer: 96 mL/min/{1.73_m2} (ref >59)
GLUCOSE: 89 mg/dL (ref 65–99)
Globulin, Total: 2.9 g/dL (ref 1.5–4.5)
POTASSIUM: 4.3 mmol/L (ref 3.5–5.2)
Sodium: 142 mmol/L (ref 134–144)
Total Protein: 7.5 g/dL (ref 6.0–8.5)

## 2017-08-10 LAB — TSH: TSH: 2.22 u[IU]/mL (ref 0.450–4.500)

## 2017-08-12 ENCOUNTER — Encounter: Payer: Self-pay | Admitting: Unknown Physician Specialty

## 2017-08-15 ENCOUNTER — Telehealth: Payer: Self-pay | Admitting: Unknown Physician Specialty

## 2017-08-15 NOTE — Telephone Encounter (Signed)
Spoke with pt and she stated that she stays in [place] with her mom for 2 weeks at a time so she has not gotten the letter. I have printed the letter and will be sending it to  [place] [phone].

## 2017-08-15 NOTE — Telephone Encounter (Signed)
Copied from [place] [id number]. Topic: Quick Communication - See Telephone Encounter >> Aug 15, 2017  9:17 AM Ether Griffins B wrote: CRM for notification. See Telephone encounter for: 08/15/17.  Pt is calling checking on labs and urine results 08/09/17

## 2017-09-25 ENCOUNTER — Ambulatory Visit: Payer: Self-pay | Admitting: Unknown Physician Specialty

## 2017-10-18 ENCOUNTER — Ambulatory Visit (INDEPENDENT_AMBULATORY_CARE_PROVIDER_SITE_OTHER): Payer: Self-pay | Admitting: Unknown Physician Specialty

## 2017-10-18 ENCOUNTER — Encounter: Payer: Self-pay | Admitting: Unknown Physician Specialty

## 2017-10-18 DIAGNOSIS — E782 Mixed hyperlipidemia: Secondary | ICD-10-CM

## 2017-10-18 DIAGNOSIS — I10 Essential (primary) hypertension: Secondary | ICD-10-CM

## 2017-10-18 DIAGNOSIS — R7401 Elevation of levels of liver transaminase levels: Secondary | ICD-10-CM | POA: Insufficient documentation

## 2017-10-18 DIAGNOSIS — R740 Nonspecific elevation of levels of transaminase and lactic acid dehydrogenase [LDH]: Secondary | ICD-10-CM

## 2017-10-18 DIAGNOSIS — F321 Major depressive disorder, single episode, moderate: Secondary | ICD-10-CM

## 2017-10-18 MED ORDER — BUPROPION HCL ER (XL) 150 MG PO TB24: 150.0000 mg | ORAL_TABLET | Freq: Every day | ORAL | 1 refills | Status: DC

## 2017-10-18 MED ORDER — SERTRALINE HCL 50 MG PO TABS: 50.0000 mg | ORAL_TABLET | Freq: Every day | ORAL | 1 refills | Status: DC

## 2017-10-18 MED ORDER — HYDROCHLOROTHIAZIDE 25 MG PO TABS: 25.0000 mg | ORAL_TABLET | Freq: Every day | ORAL | 1 refills | Status: DC | PRN

## 2017-10-18 NOTE — Assessment & Plan Note (Signed)
Discussed weight loss and diet changes

## 2017-10-18 NOTE — Assessment & Plan Note (Signed)
Improved.  Encouraged counseling for boundary setting.

## 2017-10-18 NOTE — Assessment & Plan Note (Signed)
Discussed diet and exercise for cholesterol reduction.  Reviewed last cholesterol levels and recheck in 6 months

## 2017-10-18 NOTE — Assessment & Plan Note (Signed)
Stable, continue present medications.   

## 2017-10-18 NOTE — Progress Notes (Signed)
BP 112/76   Pulse 73   Temp 97.7 F (36.5 C) (Oral)   Ht 5\' 8"  (1.727 m)   Wt 252 lb 12.8 oz (114.7 kg)   SpO2 95%   BMI 38.44 kg/m    Subjective:    Patient ID: Stefanie Stewart, female    DOB: Mar 17, 1976, 42 y.o.   MRN: [id number]  HPI: Stefanie Stewart is a 42 y.o. female  Chief Complaint  Patient presents with  . Depression    6 week f/up   Depression Last visit increased Wellbutrin to 150 mg. Overwhelmed with caregiving responsibilities.  Feels the Wellbutrin has helped  Depression screen [facility] 2/9 10/18/2017 08/09/2017 12/28/2016 06/25/2016 05/18/2016  Decreased Interest 1 2 1 1 2   Down, Depressed, Hopeless 0 2 1 1 2   PHQ - 2 Score 1 4 2 2 4   Altered sleeping 2 0 0 0 1  Tired, decreased energy 1 1 1  0 2  Change in appetite 3 2 3 2 3   Feeling bad or failure about yourself  0 2 1 0 2  Trouble concentrating - 0 1 0 2  Moving slowly or fidgety/restless 0 0 0 0 0  Suicidal thoughts 0 0 0 0 0  PHQ-9 Score 7 9 8 4 14    Hypertension Using medications without difficulty Average home BPs   No problems or lightheadedness No chest pain with exertion or shortness of breath No Edema   Hyperlipidemia The 10-year ASCVD risk score Mikey Bussing DC Jr., et al., 2013) is: 9.6%   Values used to calculate the score:     Age: 23 years     Sex: Female     Is Non-Hispanic African American: No     Diabetic: No     Tobacco smoker: Yes     Systolic Blood Pressure: 824 mmHg     Is BP treated: Yes     HDL Cholesterol: 32 mg/dL     Total Cholesterol: 247 mg/dL   Relevant past medical, surgical, family and social history reviewed and updated as indicated. Interim medical history since our last visit reviewed. Allergies and medications reviewed and updated.  Review of Systems  Per HPI unless specifically indicated above     Objective:    BP 112/76   Pulse 73   Temp 97.7 F (36.5 C) (Oral)   Ht 5\' 8"  (1.727 m)   Wt 252 lb 12.8 oz (114.7 kg)   SpO2 95%   BMI 38.44 kg/m   Wt Readings from  Last 3 Encounters:  10/18/17 252 lb 12.8 oz (114.7 kg)  08/09/17 249 lb (112.9 kg)  02/07/17 235 lb 6.4 oz (106.8 kg)    Physical Exam  Constitutional: She is oriented to person, place, and time. She appears well-developed and well-nourished. No distress.  HENT:  Head: Normocephalic and atraumatic.  Eyes: Conjunctivae and lids are normal. Right eye exhibits no discharge. Left eye exhibits no discharge. No scleral icterus.  Neck: Normal range of motion. Neck supple. No JVD present. Carotid bruit is not present.  Cardiovascular: Normal rate, regular rhythm and normal heart sounds.  Pulmonary/Chest: Effort normal and breath sounds normal.  Abdominal: Normal appearance. There is no splenomegaly or hepatomegaly.  Musculoskeletal: Normal range of motion.  Neurological: She is alert and oriented to person, place, and time.  Skin: Skin is warm, dry and intact. No rash noted. No pallor.  Psychiatric: She has a normal mood and affect. Her behavior is normal. Judgment and thought content normal.  Assessment & Plan:   Problem List Items Addressed This Visit      Unprioritized   Depression    Improved.  Encouraged counseling for boundary setting.        Relevant Medications   sertraline (ZOLOFT) 50 MG tablet   buPROPion (WELLBUTRIN XL) 150 MG 24 hr tablet   Elevated ALT measurement    Discussed weight loss and diet changes      Hypertension    Stable, continue present medications.        Relevant Medications   hydrochlorothiazide (HYDRODIURIL) 25 MG tablet   Mixed hyperlipidemia    Discussed diet and exercise for cholesterol reduction.  Reviewed last cholesterol levels and recheck in 6 months      Relevant Medications   hydrochlorothiazide (HYDRODIURIL) 25 MG tablet       Follow up plan: Return in about 6 months (around 04/20/2018).

## 2017-12-27 ENCOUNTER — Telehealth (HOSPITAL_COMMUNITY): Payer: Self-pay | Admitting: *Deleted

## 2017-12-27 NOTE — Telephone Encounter (Signed)
Patient called and left voicemail. Called patient back. Patient complained of heavy AUB for four weeks. Patient states she has bled through a pad and her clothes in less than an hour. Patient stated she got a prescription called in but needs to follow-up. Patient stated the bleeding has decreased. Patient doesn't have insurance. Advised patient if the bleeding becomes heavy again that she is bleeding through a pad in less than an hour to go to [facility]U. Offered to refer her to the [facility] for follow-up. Told her she will need to complete the financial assistance application. Told patient that someone from the [facility] will call her will the appointment. Patient verbalized understanding.

## 2017-12-30 ENCOUNTER — Other Ambulatory Visit (HOSPITAL_COMMUNITY): Payer: Self-pay | Admitting: *Deleted

## 2017-12-30 DIAGNOSIS — Z1231 Encounter for screening mammogram for malignant neoplasm of breast: Secondary | ICD-10-CM

## 2018-01-10 ENCOUNTER — Telehealth: Payer: Self-pay | Admitting: Unknown Physician Specialty

## 2018-01-10 NOTE — Telephone Encounter (Signed)
-----   Message from Stark Klein sent at 10/18/2017 12:43 PM EDT -----   ----- Message ----- From: Jill Side Sent: 10/18/2017  10:43 AM To: Stark Klein  Return in about 6 months (around 04/20/2018). With jolene fridays

## 2018-01-10 NOTE — Telephone Encounter (Signed)
called pt to schedule an appt. Printing letter to mail as well.

## 2018-01-21 ENCOUNTER — Encounter: Payer: No Typology Code available for payment source | Admitting: Obstetrics & Gynecology

## 2018-01-22 ENCOUNTER — Other Ambulatory Visit: Payer: Self-pay | Admitting: Unknown Physician Specialty

## 2018-01-30 ENCOUNTER — Ambulatory Visit: Payer: Self-pay | Admitting: Obstetrics and Gynecology

## 2018-01-30 ENCOUNTER — Encounter: Payer: Self-pay | Admitting: Obstetrics and Gynecology

## 2018-01-30 DIAGNOSIS — N920 Excessive and frequent menstruation with regular cycle: Secondary | ICD-10-CM | POA: Insufficient documentation

## 2018-01-30 MED ORDER — NORETHINDRONE ACETATE 5 MG PO TABS: 5.0000 mg | ORAL_TABLET | Freq: Every day | ORAL | 5 refills | Status: DC

## 2018-01-30 NOTE — Progress Notes (Signed)
Stefanie Stewart presents for evaluation of menorrhagia. Has been followed for this problem by private OB/GYN ( last seen oct 2019) Pt self pay and unable to proceed with further eval and W/U. Was previously on Aygestin and did well with this but ran out over a month ago.   Pt reports cycles have been irregular, long, heavy and painful this past yr. She has had to use both pads/tampoons and depends for her cycles. Has tried OCP's in the past, Depo Provera ( wt gain). Presently not sexual active. H/O endometriosis, Dx via Laparoscopy.  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 Discussed Tx options with pt and need for additional w/u before proceed ing with Tx. Aygestin refilled to used daily. Pt let us know when financial aid approved and will then proceed with GYN U/S and EMBX.

## 2018-02-02 ENCOUNTER — Other Ambulatory Visit: Payer: Self-pay | Admitting: Unknown Physician Specialty

## 2018-02-03 NOTE — Telephone Encounter (Signed)
Refilled on 01/22/18.

## 2018-03-03 ENCOUNTER — Telehealth (HOSPITAL_COMMUNITY): Payer: Self-pay

## 2018-03-03 NOTE — Telephone Encounter (Signed)
Returned patients phone call. Left message for her to call me back to reschedule her appointment with [facility].

## 2018-03-04 ENCOUNTER — Ambulatory Visit (HOSPITAL_COMMUNITY): Payer: No Typology Code available for payment source

## 2018-04-13 ENCOUNTER — Other Ambulatory Visit: Payer: Self-pay | Admitting: Unknown Physician Specialty

## 2018-04-14 NOTE — Telephone Encounter (Signed)
Courtesy refill until appointment 05/02/18.

## 2018-05-02 ENCOUNTER — Ambulatory Visit: Payer: Self-pay | Admitting: Nurse Practitioner

## 2018-05-04 ENCOUNTER — Encounter: Payer: Self-pay | Admitting: Nurse Practitioner

## 2018-05-06 ENCOUNTER — Other Ambulatory Visit: Payer: Self-pay

## 2018-05-06 ENCOUNTER — Encounter: Payer: Self-pay | Admitting: Nurse Practitioner

## 2018-05-06 ENCOUNTER — Ambulatory Visit: Payer: Self-pay | Admitting: Nurse Practitioner

## 2018-05-06 VITALS — BP 143/89 | HR 101 | Temp 98.5°F | Ht 68.0 in | Wt 277.0 lb

## 2018-05-06 DIAGNOSIS — E782 Mixed hyperlipidemia: Secondary | ICD-10-CM

## 2018-05-06 DIAGNOSIS — F321 Major depressive disorder, single episode, moderate: Secondary | ICD-10-CM

## 2018-05-06 DIAGNOSIS — Z638 Other specified problems related to primary support group: Secondary | ICD-10-CM

## 2018-05-06 DIAGNOSIS — Z6837 Body mass index (BMI) 37.0-37.9, adult: Secondary | ICD-10-CM

## 2018-05-06 DIAGNOSIS — I10 Essential (primary) hypertension: Secondary | ICD-10-CM

## 2018-05-06 MED ORDER — SERTRALINE HCL 50 MG PO TABS: 75.0000 mg | ORAL_TABLET | Freq: Every day | ORAL | 3 refills | Status: DC

## 2018-05-06 NOTE — Assessment & Plan Note (Addendum)
Information provided on DASH diet. F/U in 3 months for labs and monitoring

## 2018-05-06 NOTE — Assessment & Plan Note (Addendum)
Increase sertraline to 75mg  per day for anxiety and depression symptoms. Information provided on [facility] therapy resources and PACE program for patient. F/U in appx 4 weeks.

## 2018-05-06 NOTE — Patient Instructions (Addendum)
Alzheimer Disease Caregiver Guide  Alzheimer disease causes a person to lose the ability to remember things and make decisions. A person who has Alzheimer disease may not be able to take care of himself or herself. He or she may need help with simple tasks. The tips below can help you care for the person. What kind of changes does this condition cause? This condition makes a person:  Forget things.  Feel confused.  Act differently.  Have different moods. These things get worse with time. Tips to help with symptoms  Be calm and patient.  Respond with a simple, short answer.  Avoid correcting the person in a negative way.  Try not to take things personally, even if the person forgets your name.  Do not argue with the person. This may make the person more upset. Tips to lessen frustration  Make appointments and do daily tasks when the person is at his or her best.  Take your time. Simple tasks may take longer. Allow plenty of time to complete tasks.  Limit choices for the person.  Involve the person in what you are doing.  Keep a daily routine.  Avoid new or crowded places, if possible.  Use simple words, short sentences, and a calm voice. Only give one direction at a time.  Buy clothes and shoes that are easy to put on and take off.  Organize medicines in a pillbox for each day of the week.  Keep a calendar in a central location to remind the person of meetings or other activities.  Let people help if they offer. Take a break when needed. Tips to prevent injury  Keep floors clear. Remove rugs, magazine racks, and floor lamps.  Keep hallways well-lit.  Put a handrail and non-slip mat in the bathtub or shower.  Put childproof locks on cabinets that have dangerous items in them. These items include medicine, alcohol, guns, toxic cleaning items, sharp tools, matches, and lighters.  Put locks on doors where the person cannot see or reach them. This helps the person  to not wander out of the house and get lost.  Be prepared for emergencies. Keep a list of emergency phone numbers and addresses close by.  Bracelets may be worn that track location and identify the person as having memory problems. This should be worn at all times for safety. Tips for the future  Discuss financial and legal planning early. People with this disease have trouble managing their money as the disease gets worse. Get help from a professional.  Talk about advance directives, safety, and daily care. Take these steps: ? Create a living will and choose a power of attorney. This is someone who can make decisions for the person with Alzheimer disease when he or she can no longer do so. ? Discuss driving safety and when to stop driving. The person's doctor can help with this. ? If the person lives alone, make sure he or she is safe. Some people need extra help at home. Other people need more care at a nursing home or care center. Where to find support You can find support by joining a support group near you. Some benefits of joining a support group include:  Learning ways to manage stress.  Sharing experiences with others.  Getting emotional comfort and support.  Learning about caregiving as the disease progresses.  Knowing what community resources are available and making use of them. Where to find more information  Alzheimer's Association: [place] Contact a doctor if:  The person has a fever.  The person has a sudden behavior change that does not get better with calming strategies.  The person is not able to take care of himself or herself at home.  The person threatens you or anyone else, including himself or herself.  You are no longer able to care for the person. Summary  Alzheimer disease causes a person to forget things and to be confused.  A person who has this condition may not be able to take care of himself or herself.  Take steps to keep the person  from getting hurt. Plan for future care.  You can find support by joining a support group near you. This information is not intended to replace advice given to you by your health care provider. Make sure you discuss any questions you have with your health care provider. Document Released: 06/04/2011 Document Revised: 03/07/2017 Document Reviewed: 03/07/2017 Elsevier Interactive Patient Education  2019 [place] DASH stands for "Dietary Approaches to Stop Hypertension." The DASH eating plan is a healthy eating plan that has been shown to reduce high blood pressure (hypertension). It may also reduce your risk for type 2 diabetes, heart disease, and stroke. The DASH eating plan may also help with weight loss. What are tips for following this plan?  General guidelines  Avoid eating more than 2,300 mg (milligrams) of salt (sodium) a day. If you have hypertension, you may need to reduce your sodium intake to 1,500 mg a day.  Limit alcohol intake to no more than 1 drink a day for nonpregnant women and 2 drinks a day for men. One drink equals 12 oz of beer, 5 oz of wine, or 1 oz of hard liquor.  Work with your health care provider to maintain a healthy body weight or to lose weight. Ask what an ideal weight is for you.  Get at least 30 minutes of exercise that causes your heart to beat faster (aerobic exercise) most days of the week. Activities may include walking, swimming, or biking.  Work with your health care provider or diet and nutrition specialist (dietitian) to adjust your eating plan to your individual calorie needs. Reading food labels   Check food labels for the amount of sodium per serving. Choose foods with less than 5 percent of the Daily Value of sodium. Generally, foods with less than 300 mg of sodium per serving fit into this eating plan.  To find whole grains, look for the word "whole" as the first word in the ingredient list. Shopping  Buy products  labeled as "low-sodium" or "no salt added."  Buy fresh foods. Avoid canned foods and premade or frozen meals. Cooking  Avoid adding salt when cooking. Use salt-free seasonings or herbs instead of table salt or sea salt. Check with your health care provider or pharmacist before using salt substitutes.  Do not fry foods. Cook foods using healthy methods such as baking, boiling, grilling, and broiling instead.  Cook with heart-healthy oils, such as olive, canola, soybean, or sunflower oil. Meal planning  Eat a balanced diet that includes: ? 5 or more servings of fruits and vegetables each day. At each meal, try to fill half of your plate with fruits and vegetables. ? Up to 6-8 servings of whole grains each day. ? Less than 6 oz of lean meat, poultry, or fish each day. A 3-oz serving of meat is about the same size as a deck of cards. One egg equals 1 oz. ?  2 servings of low-fat dairy each day. ? A serving of nuts, seeds, or beans 5 times each week. ? Heart-healthy fats. Healthy fats called Omega-3 fatty acids are found in foods such as flaxseeds and coldwater fish, like sardines, salmon, and mackerel.  Limit how much you eat of the following: ? Canned or prepackaged foods. ? Food that is high in trans fat, such as fried foods. ? Food that is high in saturated fat, such as fatty meat. ? Sweets, desserts, sugary drinks, and other foods with added sugar. ? Full-fat dairy products.  Do not salt foods before eating.  Try to eat at least 2 vegetarian meals each week.  Eat more home-cooked food and less restaurant, buffet, and fast food.  When eating at a restaurant, ask that your food be prepared with less salt or no salt, if possible. What foods are recommended? The items listed may not be a complete list. Talk with your dietitian about what dietary choices are best for you. Grains Whole-grain or whole-wheat bread. Whole-grain or whole-wheat pasta. Brown rice. [place]. Bulgur.  Whole-grain and low-sodium cereals. Pita bread. Low-fat, low-sodium crackers. Whole-wheat flour tortillas. Vegetables Fresh or frozen vegetables (raw, steamed, roasted, or grilled). Low-sodium or reduced-sodium tomato and vegetable juice. Low-sodium or reduced-sodium tomato sauce and tomato paste. Low-sodium or reduced-sodium canned vegetables. Fruits All fresh, dried, or frozen fruit. Canned fruit in natural juice (without added sugar). Meat and other protein foods Skinless chicken or [place]. Ground chicken or [place]. Pork with fat trimmed off. Fish and seafood. Egg whites. Dried beans, peas, or lentils. Unsalted nuts, nut butters, and seeds. Unsalted canned beans. Lean cuts of beef with fat trimmed off. Low-sodium, lean deli meat. Dairy Low-fat (1%) or fat-free (skim) milk. Fat-free, low-fat, or reduced-fat cheeses. Nonfat, low-sodium ricotta or cottage cheese. Low-fat or nonfat yogurt. Low-fat, low-sodium cheese. Fats and oils Soft margarine without trans fats. Vegetable oil. Low-fat, reduced-fat, or light mayonnaise and salad dressings (reduced-sodium). Canola, safflower, olive, soybean, and sunflower oils. Avocado. Seasoning and other foods Herbs. Spices. Seasoning mixes without salt. Unsalted popcorn and pretzels. Fat-free sweets. What foods are not recommended? The items listed may not be a complete list. Talk with your dietitian about what dietary choices are best for you. Grains Baked goods made with fat, such as croissants, muffins, or some breads. Dry pasta or rice meal packs. Vegetables Creamed or fried vegetables. Vegetables in a cheese sauce. Regular canned vegetables (not low-sodium or reduced-sodium). Regular canned tomato sauce and paste (not low-sodium or reduced-sodium). Regular tomato and vegetable juice (not low-sodium or reduced-sodium). Angie Fava. Olives. Fruits Canned fruit in a light or heavy syrup. Fried fruit. Fruit in cream or butter sauce. Meat and other protein  foods Fatty cuts of meat. Ribs. Fried meat. Berniece Salines. Sausage. Bologna and other processed lunch meats. Salami. Fatback. Hotdogs. Bratwurst. Salted nuts and seeds. Canned beans with added salt. Canned or smoked fish. Whole eggs or egg yolks. Chicken or [place] with skin. Dairy Whole or 2% milk, cream, and half-and-half. Whole or full-fat cream cheese. Whole-fat or sweetened yogurt. Full-fat cheese. Nondairy creamers. Whipped toppings. Processed cheese and cheese spreads. Fats and oils Butter. Stick margarine. Lard. Shortening. Ghee. Bacon fat. Tropical oils, such as coconut, palm kernel, or palm oil. Seasoning and other foods Salted popcorn and pretzels. Onion salt, garlic salt, seasoned salt, table salt, and sea salt. Worcestershire sauce. Tartar sauce. Barbecue sauce. Teriyaki sauce. Soy sauce, including reduced-sodium. Steak sauce. Canned and packaged gravies. Fish sauce. Oyster sauce. Cocktail sauce.  Horseradish that you find on the shelf. Ketchup. Mustard. Meat flavorings and tenderizers. Bouillon cubes. Hot sauce and Tabasco sauce. Premade or packaged marinades. Premade or packaged taco seasonings. Relishes. Regular salad dressings. Where to find more information:  National Heart, Lung, and [place]: https://wilson-eaton.com/  American Heart Association: www.heart.org Summary  The DASH eating plan is a healthy eating plan that has been shown to reduce high blood pressure (hypertension). It may also reduce your risk for type 2 diabetes, heart disease, and stroke.  With the DASH eating plan, you should limit salt (sodium) intake to 2,300 mg a day. If you have hypertension, you may need to reduce your sodium intake to 1,500 mg a day.  When on the DASH eating plan, aim to eat more fresh fruits and vegetables, whole grains, lean proteins, low-fat dairy, and heart-healthy fats.  Work with your health care provider or diet and nutrition specialist (dietitian) to adjust your eating plan to your  individual calorie needs. This information is not intended to replace advice given to you by your health care provider. Make sure you discuss any questions you have with your health care provider. Document Released: 03/01/2011 Document Revised: 03/05/2016 Document Reviewed: 03/05/2016 Elsevier Interactive Patient Education  2019 [place] by Verner Chol

## 2018-05-06 NOTE — Assessment & Plan Note (Addendum)
Increase sertraline to 75mg  per day for anxiety and depression symptoms. Information provided on [facility] therapy resources and PACE program provided to patient. F/U in appx 4 weeks.

## 2018-05-06 NOTE — Progress Notes (Signed)
BP (!) 143/89   Pulse (!) 101   Temp 98.5 F (36.9 C) (Oral)   Ht 5\' 8"  (1.727 m)   Wt 277 lb (125.6 kg)   SpO2 96%   BMI 42.12 kg/m    Subjective:    Patient ID: Stefanie Stewart, female    DOB: 05-Jun-1975, 43 y.o.   MRN: [id number]  HPI: Stefanie Stewart is a 43 y.o. female here today for HTN/HLD and depression/anxiety f/u. Pt also reports significant weight gain in recent months.    Chief Complaint  Patient presents with  . Depression    41m f/u  . Hypertension  . Hyperlipidemia   Patient refuses labs today because she is self-pay and is currently paying other medical bills.  HYPERTENSION / HYPERLIPIDEMIA Taking HCTZ for BP, deferred labs today per patient request and deferred conversation about ASCVD risk score due to focus on patient anxiety over being a caregiver at this time. Satisfied with current treatment? yes Duration of hypertension: chronic BP monitoring frequency: "every once in a while at work" BP range: 130's / 80's BP medication side effects: yes- reports leg cramps at night "occasionally" Past BP meds:HCTZ Duration of hyperlipidemia: unknown Cholesterol medication side effects: not taking  Cholesterol supplements: none Past cholesterol medications: Unknown Medication compliance: good compliance with HCTZ Aspirin: no Recent stressors: yes - caregiver burden- caring for mother with Alzheimer disease. Father passed away in the last year.  Recurrent headaches: yes- weekly in the past 2 months Taking OTC Excedrin for headaches- it provides relief Visual changes: no Palpitations: occassionally - no known cause "for years" Dyspnea: yes - attributes to weight gain Chest pain: no Lower extremity edema: no Dizzy/lightheaded: no   The 10-year ASCVD risk score Mikey Bussing DC Jr., et al., 2013) is: 15%   Values used to calculate the score:     Age: 61 years     Sex: Female     Is Non-Hispanic African American: No     Diabetic: No     Tobacco smoker: Yes     Systolic  Blood Pressure: 143 mmHg     Is BP treated: Yes     HDL Cholesterol: 32 mg/dL     Total Cholesterol: 247 mg/dL   DEPRESSION/ANXIETY:  Pt currently taking Wellbutrin and Zoloft daily as prescribed and feels it is helping her symptoms, but admits to increased depression and anxiety symptoms.  She denies any side effects from medications. Pt reports significant caregiver stress from caring for her ailing mother with Alzheimer disease. Pt states her father passed away in the past year and she is the sole caregiver for her mother. Patient reports a 67 lb weight gain in the past year. She reports excessive eating even when not hungry. Pt states she knows this is unhealthy and self-destructive, but reports feeling as though she cannot control herself when it comes to food. She states she feels "out of control" when it comes to her eating habits. She expresses interest in finding something to help her with this "addiction". Have provided information to her on [facility] services and we discussed at length local caregiver support groups in [place] and [place] services for her mother.   Mood status: exacerbated Satisfied with current treatment?: yes Symptom severity: moderate  Duration of current treatment : chronic Side effects: no Medication compliance: good compliance Psychotherapy/counseling: none at this time, was doing therapy but stopped due to cost and loss of insurance Depressed mood: yes Anxious mood: yes Anhedonia: no Significant weight  loss or gain: yes Insomnia: yes hard to stay asleep Fatigue: yes Feelings of worthlessness or guilt: no Impaired concentration/indecisiveness: yes Suicidal ideations: no Hopelessness: no Crying spells: yes Depression screen [facility] 2/9 05/06/2018 01/30/2018 10/18/2017 08/09/2017 12/28/2016  Decreased Interest 1 1 1 2 1   Down, Depressed, Hopeless 1 0 0 2 1  PHQ - 2 Score 2 1 1 4 2   Altered sleeping 2 2 2  0 0  Tired, decreased energy 1 1 1 1 1   Change in appetite 3 3 3  2 3   Feeling bad or failure about yourself  1 0 0 2 1  Trouble concentrating 0 0 - 0 1  Moving slowly or fidgety/restless 0 0 0 0 0  Suicidal thoughts 0 0 0 0 0  PHQ-9 Score 9 7 7 9 8   Difficult doing work/chores Somewhat difficult - - - -   GAD 7 : Generalized Anxiety Score 05/06/2018 01/30/2018  Nervous, Anxious, on Edge 1 1  Control/stop worrying 1 0  Worry too much - different things 1 1  Trouble relaxing 1 0  Restless 0 0  Easily annoyed or irritable 3 1  Afraid - awful might happen 1 0  Total GAD 7 Score 8 3  Anxiety Difficulty Somewhat difficult -    Relevant past medical, surgical, family and social history reviewed and updated as indicated. Interim medical history since our last visit reviewed. Allergies and medications reviewed and updated.   Review of Systems Per HPI unless specifically indicated above     Objective:    BP (!) 143/89   Pulse (!) 101   Temp 98.5 F (36.9 C) (Oral)   Ht 5\' 8"  (1.727 m)   Wt 277 lb (125.6 kg)   SpO2 96%   BMI 42.12 kg/m   Wt Readings from Last 3 Encounters:  05/06/18 277 lb (125.6 kg)  01/30/18 265 lb (120.2 kg)  10/18/17 252 lb 12.8 oz (114.7 kg)    Physical Exam Vitals signs and nursing note reviewed.  Constitutional:      Appearance: She is well-developed.  HENT:     Head: Normocephalic.  Eyes:     General:        Right eye: No discharge.        Left eye: No discharge.     Conjunctiva/sclera: Conjunctivae normal.     Pupils: Pupils are equal, round, and reactive to light.  Neck:     Musculoskeletal: Normal range of motion and neck supple.     Thyroid: No thyromegaly.     Vascular: No carotid bruit or JVD.  Cardiovascular:     Rate and Rhythm: Normal rate and regular rhythm.     Heart sounds: Normal heart sounds.  Pulmonary:     Effort: Pulmonary effort is normal.     Breath sounds: Normal breath sounds.  Abdominal:     General: Bowel sounds are normal.     Palpations: Abdomen is soft.  Skin:    General:  Skin is warm and dry.  Neurological:     Mental Status: She is alert and oriented to person, place, and time.  Psychiatric:        Attention and Perception: Attention normal.        Mood and Affect: Affect normal. Mood is anxious.        Behavior: Behavior is cooperative.        Cognition and Memory: Cognition normal.        Judgment: Judgment normal.  Comments: Intermittent periods of tearfulness as she spoke about being caregiver.      Results for orders placed or performed in visit on 08/09/17  Microscopic Examination  Result Value Ref Range   WBC, UA None seen 0 - 5 /hpf   RBC, UA 0-2 0 - 2 /hpf   Epithelial Cells (non renal) 0-10 0 - 10 /hpf   Bacteria, UA Few None seen/Few  CBC with Differential/Platelet  Result Value Ref Range   WBC 7.5 3.4 - 10.8 x10E3/uL   RBC 4.41 3.77 - 5.28 x10E6/uL   Hemoglobin 12.7 11.1 - 15.9 g/dL   Hematocrit 38.9 34.0 - 46.6 %   MCV 88 79 - 97 fL   MCH 28.8 26.6 - 33.0 pg   MCHC 32.6 31.5 - 35.7 g/dL   RDW 13.3 12.3 - 15.4 %   Platelets 346 150 - 379 x10E3/uL   Neutrophils 64 Not Estab. %   Lymphs 29 Not Estab. %   Monocytes 6 Not Estab. %   Eos 1 Not Estab. %   Basos 0 Not Estab. %   Neutrophils Absolute 4.7 1.4 - 7.0 x10E3/uL   Lymphocytes Absolute 2.2 0.7 - 3.1 x10E3/uL   Monocytes Absolute 0.5 0.1 - 0.9 x10E3/uL   EOS (ABSOLUTE) 0.1 0.0 - 0.4 x10E3/uL   Basophils Absolute 0.0 0.0 - 0.2 x10E3/uL   Immature Granulocytes 0 Not Estab. %   Immature Grans (Abs) 0.0 0.0 - 0.1 x10E3/uL  Comprehensive metabolic panel  Result Value Ref Range   Glucose 89 65 - 99 mg/dL   BUN 12 6 - 24 mg/dL   Creatinine, Ser 0.76 0.57 - 1.00 mg/dL   GFR calc non Af Amer 96 >59 mL/min/1.73   GFR calc Af Amer 111 >59 mL/min/1.73   BUN/Creatinine Ratio 16 9 - 23   Sodium 142 134 - 144 mmol/L   Potassium 4.3 3.5 - 5.2 mmol/L   Chloride 103 96 - 106 mmol/L   CO2 21 20 - 29 mmol/L   Calcium 9.3 8.7 - 10.2 mg/dL   Total Protein 7.5 6.0 - 8.5 g/dL    Albumin 4.6 3.5 - 5.5 g/dL   Globulin, Total 2.9 1.5 - 4.5 g/dL   Albumin/Globulin Ratio 1.6 1.2 - 2.2   Bilirubin Total 0.4 0.0 - 1.2 mg/dL   Alkaline Phosphatase 49 39 - 117 IU/L   AST 32 0 - 40 IU/L   ALT 53 (H) 0 - 32 IU/L  Lipid Panel w/o Chol/HDL Ratio  Result Value Ref Range   Cholesterol, Total 247 (H) 100 - 199 mg/dL   Triglycerides 265 (H) 0 - 149 mg/dL   HDL 32 (L) >39 mg/dL   VLDL Cholesterol Cal 53 (H) 5 - 40 mg/dL   LDL Calculated 162 (H) 0 - 99 mg/dL  UA/M w/rflx Culture, Routine  Result Value Ref Range   Specific Gravity, UA 1.015 1.005 - 1.030   pH, UA 7.0 5.0 - 7.5   Color, UA Orange Yellow   Appearance Ur Cloudy (A) Clear   Leukocytes, UA Negative Negative   Protein, UA Trace (A) Negative/Trace   Glucose, UA Negative Negative   Ketones, UA Negative Negative   RBC, UA Trace (A) Negative   Bilirubin, UA Negative Negative   Urobilinogen, Ur 0.2 0.2 - 1.0 mg/dL   Nitrite, UA Negative Negative   Microscopic Examination See below:   TSH  Result Value Ref Range   TSH 2.220 0.450 - 4.500 uIU/mL      Assessment &  Plan:   Problem List Items Addressed This Visit      Cardiovascular and Mediastinum   Hypertension    Continue current treatment plan. BP above goal today, although patient very anxious.  Information on DASH diet provided. F/U in 3 months to reassess.         Other   Caregiver role strain - Primary    Increase sertraline to 75mg  per day for anxiety and depression symptoms. Information provided on [facility] therapy resources and PACE program provided to patient. F/U in appx 4 weeks.       Obesity    At this time recommend DASH diet and focus on activity, such as walking, which would benefit mood and overall physical health.      Depression    Increase sertraline to 75mg  per day for anxiety and depression symptoms. Information provided on [facility] therapy resources and PACE program for patient. F/U in appx 4 weeks.       Relevant Medications    sertraline (ZOLOFT) 50 MG tablet   Mixed hyperlipidemia    Information provided on DASH diet. F/U in 3 months for labs and monitoring           Follow up plan: Return in about 4 weeks (around 06/03/2018) for Depression and Anxiety F/U.

## 2018-05-06 NOTE — Assessment & Plan Note (Signed)
At this time recommend DASH diet and focus on activity, such as walking, which would benefit mood and overall physical health.

## 2018-05-06 NOTE — Assessment & Plan Note (Addendum)
Continue current treatment plan. BP above goal today, although patient very anxious.  Information on DASH diet provided. F/U in 3 months to reassess.

## 2018-05-29 ENCOUNTER — Ambulatory Visit (INDEPENDENT_AMBULATORY_CARE_PROVIDER_SITE_OTHER): Payer: Self-pay | Admitting: Obstetrics and Gynecology

## 2018-05-29 ENCOUNTER — Other Ambulatory Visit (HOSPITAL_COMMUNITY)
Admission: RE | Admit: 2018-05-29 | Discharge: 2018-05-29 | Disposition: A | Discharge status: Home / Self Care | Payer: Self-pay | Source: Ambulatory Visit | Attending: Obstetrics and Gynecology | Admitting: Obstetrics and Gynecology

## 2018-05-29 ENCOUNTER — Encounter: Payer: Self-pay | Admitting: Obstetrics and Gynecology

## 2018-05-29 VITALS — BP 154/88 | Ht 68.0 in | Wt 276.1 lb

## 2018-05-29 DIAGNOSIS — N921 Excessive and frequent menstruation with irregular cycle: Secondary | ICD-10-CM | POA: Insufficient documentation

## 2018-05-29 DIAGNOSIS — N809 Endometriosis, unspecified: Secondary | ICD-10-CM

## 2018-05-29 NOTE — Progress Notes (Signed)
Ms Toenjes is here for f/u for menorraghia with irregular cycles. Last seen in 11/19. Was unable to proceed with  W/u d/t financial reasons. Has now been approved for financial assistance. She is currently on Aygestin and doing well except starting to have some HA.  Pt with H/o c section and endometriosis.  PE AF VSS Lungs clear Heart RRR Abd soft + BS obese GU nl EGBUS cervix no lesion uterus small < 10 weeks decreased mobility narrow posterior cul de sac no masses or tenderness, exam limited by pt habitus  ENDOMETRIAL BIOPSY     The indications for endometrial biopsy were reviewed.   Risks of the biopsy including cramping, bleeding, infection, uterine perforation, inadequate specimen and need for additional procedures  were discussed. The patient states she understands and agrees to undergo procedure today. Consent was signed. Time out was performed. Urine HCG was negative. During the pelvic exam, the cervix was prepped with Betadine. A single-toothed tenaculum was placed on the anterior lip of the cervix to stabilize it. The 3 mm pipelle was introduced into the endometrial cavity without difficulty to a depth of 8 cm, and a moderate amount of tissue was obtained and sent to pathology. The instruments were removed from the patient's vagina. Minimal bleeding from the cervix was noted. The patient tolerated the procedure well. Routine post-procedure instructions were given to the patient.    A/P Menorrhagia with irregular cycle        Endometriosis.  Will check GYN U/S. EMBX completed today. Will continue with Aygestin for now. Declines IUD. Interested in hysterectomy. By pelvic exam today feel laparoscopic or robotic would be best approach. Pt will be contacted with test results and further f/u will be scheduled, including possible referral to my partner, Dr Doreene Burke- Tamala Julian.

## 2018-05-29 NOTE — Patient Instructions (Signed)

## 2018-06-02 ENCOUNTER — Telehealth: Payer: Self-pay | Admitting: *Deleted

## 2018-06-02 NOTE — Telephone Encounter (Addendum)
-----   Message from Chancy Milroy, MD sent at 06/02/2018  3:37 PM EDT ----- Please let Ms Robbs know that her [facility] was normal. Keep U/S appt. Thanks Legrand Como  06/02/18  1920  Called pt and informed her that the endometrial biopsy was normal.  Pt voiced understanding and appreciation for the call.

## 2018-06-04 ENCOUNTER — Other Ambulatory Visit: Payer: Self-pay

## 2018-06-04 ENCOUNTER — Ambulatory Visit (HOSPITAL_COMMUNITY)
Admission: RE | Admit: 2018-06-04 | Discharge: 2018-06-04 | Disposition: A | Discharge status: Home / Self Care | Payer: Self-pay | Source: Ambulatory Visit | Attending: Obstetrics and Gynecology | Admitting: Obstetrics and Gynecology

## 2018-06-04 DIAGNOSIS — N921 Excessive and frequent menstruation with irregular cycle: Secondary | ICD-10-CM | POA: Insufficient documentation

## 2018-06-06 ENCOUNTER — Ambulatory Visit: Payer: Self-pay | Admitting: Nurse Practitioner

## 2018-06-09 ENCOUNTER — Telehealth: Payer: Self-pay | Admitting: Family Medicine

## 2018-06-09 NOTE — Telephone Encounter (Signed)
Stefanie Stewart called a second time about finding out results of [name] and what kind of hysterectomy she will need so she can " get the ball " rolling before her financial aid runs out first of May.

## 2018-06-09 NOTE — Telephone Encounter (Signed)
Patient called to follow up as far as getting her surgery, she said she haven't heard anything about her [name] Results to determine what surgery she will need, said her insurance run out the first of May and would like it before then

## 2018-06-10 ENCOUNTER — Telehealth: Payer: Self-pay | Admitting: Family Medicine

## 2018-06-10 NOTE — Telephone Encounter (Signed)
Called patient, no answer- left message stating we have received your voicemail message. We are sending it to Dr Rip Harbour to review and will call you back whenever we hear something. You are also welcome to call us back as well. Will route to Dr Rip Harbour.

## 2018-06-10 NOTE — Telephone Encounter (Signed)
Patient called back into front office stating she was returning my phone call. Informed patient of results/recommendations by Dr Rip Harbour. Patient verbalized understanding and reports concern with financial aid ending and Dr Vladimir Creeks availability and the surgery schedule. Patient states are you still doing surgeries right now. Told patient I wasn't sure but I thought we are at the moment. Told patient I don't know Dr Maylene Roes Smith's surgery schedule as that is done outside of this office. Told patient Dr Ihor Dow will review her chart and let Dr Rip Harbour know if she can do the surgery or not and we will notify her at that point. Advised she call [facility]'s financial aid's office to inquire about getting approved for a longer duration/reapplying. Told patient whenever we hear something back we will call and let her know. Patient verbalized understanding and asked why Dr Rip Harbour wouldn't do the surgery. Told patient he would but the robotic surgery would be less invasive which is better for her, she would feel better sooner and be back on her feet/working sooner. Patient verbalized understanding & will await return call.

## 2018-06-10 NOTE — Telephone Encounter (Signed)
The patient called in regards to a message left, transferred the call to a nurse.

## 2018-06-10 NOTE — Telephone Encounter (Signed)
U/S normal except for possible fluid collection in her tube. I have routed her information to my partner, Dr Purvis Kilts for consideration of robotic hysterectomy. Thanks Legrand Como

## 2018-06-10 NOTE — Telephone Encounter (Signed)
Called patient, no answer- left message to call us back.

## 2018-06-12 ENCOUNTER — Ambulatory Visit (HOSPITAL_COMMUNITY): Payer: No Typology Code available for payment source

## 2018-06-12 ENCOUNTER — Ambulatory Visit
Admission: RE | Admit: 2018-06-12 | Discharge: 2018-06-12 | Disposition: A | Discharge status: Home / Self Care | Payer: No Typology Code available for payment source | Source: Ambulatory Visit | Attending: Obstetrics and Gynecology | Admitting: Obstetrics and Gynecology

## 2018-06-12 DIAGNOSIS — Z1231 Encounter for screening mammogram for malignant neoplasm of breast: Secondary | ICD-10-CM

## 2018-06-20 ENCOUNTER — Encounter: Payer: Self-pay | Admitting: *Deleted

## 2018-07-07 ENCOUNTER — Other Ambulatory Visit: Payer: Self-pay | Admitting: Unknown Physician Specialty

## 2018-07-31 ENCOUNTER — Other Ambulatory Visit: Payer: Self-pay | Admitting: Nurse Practitioner

## 2018-07-31 NOTE — Telephone Encounter (Signed)
Approved per protocol with exception of last b/p.  Requested Prescriptions  Pending Prescriptions Disp Refills  . buPROPion (WELLBUTRIN XL) 150 MG 24 hr tablet [Pharmacy Med Name: buPROPion HCl ER (XL) 150 MG Oral Tablet Extended Release 24 Hour] 30 tablet 0    Sig: Take 1 tablet by mouth once daily     Psychiatry: Antidepressants - bupropion Failed - 07/31/2018  9:09 AM      Failed - Last BP in normal range    BP Readings from Last 1 Encounters:  05/29/18 (!) 154/88         Passed - Completed PHQ-2 or PHQ-9 in the last 360 days.      Passed - Valid encounter within last 6 months    Recent Outpatient Visits          2 months ago Caregiver role strain   [facility] [place], Esto T, NP   9 months ago Essential hypertension   [place] Kathrine Haddock, NP   11 months ago Plantar wart   [facility] Kathrine Haddock, NP   1 year ago Current moderate episode of major depressive disorder without prior episode ([place])   [facility] Kathrine Haddock, NP   2 years ago Moderate single current episode of major depressive disorder [facility])   [place] Kathrine Haddock, NP

## 2018-08-13 ENCOUNTER — Telehealth: Payer: Self-pay | Admitting: Obstetrics and Gynecology

## 2018-08-13 NOTE — Telephone Encounter (Signed)
The patient called in regards to a hysterectomy. She stated Dr. Rip Harbour told her he would have a referral placed in for in the office. Scheduled the patient with Dr. Ihor Dow per Dr. Rip Harbour.

## 2018-08-25 ENCOUNTER — Encounter: Payer: Self-pay | Admitting: Physician Assistant

## 2018-08-25 ENCOUNTER — Telehealth (INDEPENDENT_AMBULATORY_CARE_PROVIDER_SITE_OTHER): Payer: Self-pay | Admitting: Obstetrics & Gynecology

## 2018-08-25 ENCOUNTER — Other Ambulatory Visit: Payer: Self-pay

## 2018-08-25 ENCOUNTER — Telehealth: Payer: No Typology Code available for payment source | Admitting: Physician Assistant

## 2018-08-25 ENCOUNTER — Encounter: Payer: Self-pay | Admitting: Obstetrics & Gynecology

## 2018-08-25 DIAGNOSIS — N941 Unspecified dyspareunia: Secondary | ICD-10-CM

## 2018-08-25 DIAGNOSIS — R102 Pelvic and perineal pain: Secondary | ICD-10-CM

## 2018-08-25 DIAGNOSIS — R109 Unspecified abdominal pain: Secondary | ICD-10-CM

## 2018-08-25 DIAGNOSIS — M5489 Other dorsalgia: Secondary | ICD-10-CM

## 2018-08-25 DIAGNOSIS — N898 Other specified noninflammatory disorders of vagina: Secondary | ICD-10-CM

## 2018-08-25 DIAGNOSIS — N939 Abnormal uterine and vaginal bleeding, unspecified: Secondary | ICD-10-CM

## 2018-08-25 NOTE — Progress Notes (Signed)
I connected with  Stefanie Stewart on 08/25/18 at  3:35 PM EDT by telephone and verified that I am speaking with the correct person using two identifiers.   I discussed the limitations, risks, security and privacy concerns of performing an evaluation and management service by telephone and the availability of in person appointments. I also discussed with the patient that there may be a patient responsible charge related to this service. The patient expressed understanding and agreed to proceed.  Linda,RN 08/25/2018  3:00 PM

## 2018-08-25 NOTE — Progress Notes (Signed)
TELEHEALTH GYNECOLOGY VIRTUAL VIDEO VISIT ENCOUNTER NOTE  Provider location: [facility] [employer] [facility]   I connected with Stefanie Stewart on 08/25/18 at  3:35 PM EDT by MyChart Video Encounter at home and verified that I am speaking with the correct person using two identifiers.   I discussed the limitations, risks, security and privacy concerns of performing an evaluation and management service by telephone and the availability of in person appointments. I also discussed with the patient that there may be a patient responsible charge related to this service. The patient expressed understanding and agreed to proceed.   History:  Stefanie Stewart is a 43 y.o. G1P1 female being evaluated today for pelvic pain and AUB.  She denies any abnormal vaginal discharge. Pt reports severely heavy bleeding. She has been wearing pads and breaking through them. She denies bleeding between cycles. The bleeding is improved on the Agystin.    Pt is s/p laparoscopic dx'd endometriosis. Pt is also s/p c/section.  Weight: 260 or 270#      Past Medical History:  Diagnosis Date  . Anxiety   . Cervical syndrome   . COPD (chronic obstructive pulmonary disease) ([place])   . Depression   . Edema 2013   legs  . Endometriosis   . Hyperlipidemia   . Hypertension   . IBS (irritable bowel syndrome)   . Lump or mass in breast 2013   left   . Obesity   . Plantar fibromatosis   . Tobacco abuse    Past Surgical History:  Procedure Laterality Date  . BREAST BIOPSY Right   . CERVICAL BIOPSY  W/ LOOP ELECTRODE EXCISION    . CESAREAN SECTION  1996  . COLPOSCOPY  2007  . LEEP  2007   The following portions of the patient's history were reviewed and updated as appropriate: allergies, current medications, past family history, past medical history, past social history, past surgical history and problem list.   Health Maintenance:  Normal pap and negative HRHPV on 12/20/2016.  Normal mammogram on  06/12/2018.   Review of Systems:  Pertinent items noted in HPI and remainder of comprehensive ROS otherwise negative.  Physical Exam:   General:  Alert, oriented and cooperative. Patient appears to be in no acute distress.  Mental Status: Normal mood and affect. Normal behavior. Normal judgment and thought content.   Respiratory: Normal respiratory effort, no problems with respiration noted  Rest of physical exam deferred due to type of encounter  Labs and Imaging  05/29/2018   Diagnosis Endometrium, biopsy - SCANT BENIGN ENDOMETRIUM. - BENIGN SQUAMOUS EPITHELIUM. - NO HYPERPLASIA OR MALIGNANCY.  06/04/2018 CLINICAL DATA:  Menorrhagia, history of endometriosis  EXAM: TRANSABDOMINAL AND TRANSVAGINAL ULTRASOUND OF PELVIS  TECHNIQUE: Both transabdominal and transvaginal ultrasound examinations of the pelvis were performed. Transabdominal technique was performed for global imaging of the pelvis including uterus, ovaries, adnexal regions, and pelvic cul-de-sac. It was necessary to proceed with endovaginal exam following the transabdominal exam to visualize the endometrium and bilateral ovaries.  COMPARISON:  None  FINDINGS: Uterus  Measurements: 10.1 x 5.5 x 6.1 cm = volume: 135.9 mL mL. Heterogeneous appearance with [place] blind shadowing, suggesting adenomyosis.  Endometrium  Thickness: 5 mm.  No focal abnormality visualized.  Right ovary  Measurements: 2.8 x 1.8 x 2.2 cm = volume: 5.7 mL. Normal appearance/no adnexal mass.  Left ovary  Measurements: 3.1 x 1.4 x 2.6 cm = volume: 5.8 mL. Tubular cystic lesion in the left  adnexa, favoring hydrosalpinx.  Other findings  No abnormal free fluid.  IMPRESSION: Possible uterine adenomyosis, equivocal.  Tubular cystic lesion in the left adnexa, favoring hydrosalpinx.  Assessment and Plan:     Pelvic pain and AUB. Pt has had improvement with Progestins. She is a high risk surgical candidate. She  would likely benefit from the [place] and was not prev offered this. As I discuss this option with her she would like to read up on the device at the sites provided to her and she will call back with her choice of whether to proceed.          I discussed the assessment and treatment plan with the patient. The patient was provided an opportunity to ask questions and all were answered. The patient agreed with the plan and demonstrated an understanding of the instructions.   The patient was advised to call back or seek an in-person evaluation/go to the ED if the symptoms worsen or if the condition fails to improve as anticipated.  I provided 25 minutes of face-to-face time during this encounter.   Lavonia Drafts, MD [facility] [employer], [place]

## 2018-08-25 NOTE — Progress Notes (Signed)
Based on what you shared with me, I feel your condition warrants further evaluation and I recommend that you be seen for a face to face office visit.   [name],   You have stated that you submitted an Evisit to discuss your option for surgery. This Evisit may have been indicated for your doctor and not for an Electronic visit.  Please follow up with your doctor for further discussion about your surgery.   NOTE: If you entered your credit card information for this eVisit, you will not be charged. You may see a "hold" on your card for the $35 but that hold will drop off and you will not have a charge processed.  If you are having a true medical emergency please call 911.  If you need an urgent face to face visit, [facility] has four urgent care centers for your convenience.    PLEASE NOTE: THE [facility] LOCATIONS AND URGENT CARE CLINICS DO NOT HAVE THE TESTING FOR CORONAVIRUS COVID19 AVAILABLE.  IF YOU FEEL YOU NEED THIS TEST YOU MUST GO TO A TRIAGE LOCATION AT [facility]   [email or web address] to reserve your spot online an avoid wait times  [facility] [address], [address] [place], [place] [address] Modified hours of operation: Monday-Friday, 12 PM to 6 PM  Saturday & Sunday 10 AM to 4 PM *Across the street from [place] (New Address!) [address] [place], [place] [address] *Just off [address], across the road from [place] hours of operation: Monday-Friday, 12 PM to 6 PM  Closed Saturday & Sunday  [facility]'s modified hours of operation will be in effect from May 1 until May 31   The following sites will take your insurance:  . [facility] [place] a Provider at this Location  [address] [place], [place] [address] . 10 am to 8 pm Monday-Friday . 12 pm to 8 pm Saturday-Sunday   . [facility] at [place] a Provider at this Location  [address], [place], [place] [address] . 8 am to 8 pm Monday-Friday . 9 am to 6 pm Saturday . 11 am to 6 pm Sunday   . [facility] at [place] Get Driving Directions  [address] [place], [place] [address] . 8 am to 8 pm Monday-Friday . 8 am to 4 pm Saturday-Sunday   Your e-visit answers were reviewed by a board certified advanced clinical practitioner to complete your personal care plan.  Thank you for using e-Visits. I have spent 7 min in completion and review of this note- [facility]

## 2018-08-28 ENCOUNTER — Encounter: Payer: Self-pay | Admitting: Obstetrics & Gynecology

## 2018-09-07 ENCOUNTER — Other Ambulatory Visit: Payer: Self-pay | Admitting: Nurse Practitioner

## 2018-10-21 ENCOUNTER — Other Ambulatory Visit: Payer: Self-pay | Admitting: Obstetrics and Gynecology

## 2018-10-21 ENCOUNTER — Other Ambulatory Visit: Payer: Self-pay | Admitting: Nurse Practitioner

## 2018-10-21 NOTE — Telephone Encounter (Signed)
Requested Prescriptions  Pending Prescriptions Disp Refills  . buPROPion (WELLBUTRIN XL) 150 MG 24 hr tablet [Pharmacy Med Name: buPROPion HCl ER (XL) 150 MG Oral Tablet Extended Release 24 Hour] 30 tablet 0    Sig: Take 1 tablet by mouth once daily     Psychiatry: Antidepressants - bupropion Failed - 10/21/2018  7:20 AM      Failed - Last BP in normal range    BP Readings from Last 1 Encounters:  05/29/18 (!) 154/88         Passed - Valid encounter within last 6 months    Recent Outpatient Visits          5 months ago Caregiver role strain   [employer], Noel T, NP   1 year ago Essential hypertension   [place] Kathrine Haddock, NP   1 year ago Plantar wart   [facility] Kathrine Haddock, NP   1 year ago Current moderate episode of major depressive disorder without prior episode ([place])   [place] Kathrine Haddock, NP   2 years ago Moderate single current episode of major depressive disorder ([place])   [place] Kathrine Haddock, NP      Future Appointments            In 3 days [name], [name], NP [employer]           Passed - Completed PHQ-2 or PHQ-9 in the last 360 days.      Spoke with patient, transferred her to [facility] to schedule appointment.  Patient scheduled for 10/23/2018.

## 2018-10-24 ENCOUNTER — Encounter: Payer: Self-pay | Admitting: Nurse Practitioner

## 2018-10-24 ENCOUNTER — Ambulatory Visit (INDEPENDENT_AMBULATORY_CARE_PROVIDER_SITE_OTHER): Payer: Self-pay | Admitting: Nurse Practitioner

## 2018-10-24 ENCOUNTER — Other Ambulatory Visit: Payer: Self-pay

## 2018-10-24 VITALS — BP 136/88 | HR 90 | Temp 97.3°F

## 2018-10-24 DIAGNOSIS — Z638 Other specified problems related to primary support group: Secondary | ICD-10-CM

## 2018-10-24 DIAGNOSIS — F321 Major depressive disorder, single episode, moderate: Secondary | ICD-10-CM

## 2018-10-24 NOTE — Assessment & Plan Note (Addendum)
Chronic, ongoing, has improved with increase in Sertraline.  Denies SI/HI.  Continue current medication regimen.  Return in 3 months for follow-up and labs in office.

## 2018-10-24 NOTE — Patient Instructions (Signed)

## 2018-10-24 NOTE — Progress Notes (Addendum)
BP 136/88 (BP Location: Left Arm)   Pulse 90 Comment: pt reported  Temp (!) 97.3 F (36.3 C) (Temporal) Comment: pt reported   Subjective:    Patient ID: Stefanie Stewart, female    DOB: [birth year], 43 y.o.   MRN: [id number]  HPI: Stefanie Stewart is a 43 y.o. female  Chief Complaint  Patient presents with  . Depression    . This visit was completed via D[employer]ity due to the restrictions of the COVID-19 pandemic. All issues as above were discussed and addressed. Physical exam was done as above through visual confirmation on Doximity. If it was felt that the patient should be evaluated in the office, they were directed there. The patient verbally consented to this visit. . Location of the patient: home . Location of the provider: home . Those involved with this call:  . Provider: Marnee Guarneri, DNP . CMA: Stefanie Stewart, CMA . Front Desk/Registration: Jill Side  . Time spent on call: 15 minutes with patient face to face via video conference. More than 50% of this time was spent in counseling and coordination of care. 10 minutes total spent in review of patient's record and preparation of their chart.  . I verified patient identity using two factors (patient name and date of birth). Patient consents verbally to being seen via telemedicine visit today.    DEPRESSION Currently taking Zoloft 75 MG and Wellbutrin 150 MG XL daily.  Zoloft was increased at last visit, which she reports has helped her mood.  Has caregiver role strain. Pt reports significant caregiver stress from caring for her ailing mother with Alzheimer disease. Pt states her father passed away in the past year and she is the sole caregiver for her mother. Patient reports a 67 lb weight gain in the past year. She reports excessive eating even when not hungry due to mood.  Since last visit with provider she did lose her job, worked in [employer].  Does have helpers come into home. Mood status: stable Satisfied with  current treatment?: yes Symptom severity: mild  Duration of current treatment : chronic Side effects: no Medication compliance: good compliance Psychotherapy/counseling: none Depressed mood: yes Anxious mood: yes Anhedonia: no Significant weight loss or gain: no Insomnia: yes hard to fall asleep Fatigue: no Feelings of worthlessness or guilt: no Impaired concentration/indecisiveness: yes Suicidal ideations: no Hopelessness: no Crying spells: no Depression screen [facility] 2/9 10/24/2018 05/29/2018 05/06/2018 01/30/2018 10/18/2017  Decreased Interest 1 1 1 1 1   Down, Depressed, Hopeless 1 1 1  0 0  PHQ - 2 Score 2 2 2 1 1   Altered sleeping 2 3 2 2 2   Tired, decreased energy 0 1 1 1 1   Change in appetite 2 3 3 3 3   Feeling bad or failure about yourself  1 0 1 0 0  Trouble concentrating 1 0 0 0 -  Moving slowly or fidgety/restless 0 0 0 0 0  Suicidal thoughts 0 0 0 0 0  PHQ-9 Score 8 9 9 7 7   Difficult doing work/chores Somewhat difficult - Somewhat difficult - -  Some recent data might be hidden    Relevant past medical, surgical, family and social history reviewed and updated as indicated. Interim medical history since our last visit reviewed. Allergies and medications reviewed and updated.  Review of Systems  Constitutional: Negative for activity change, appetite change, diaphoresis, fatigue and fever.  Respiratory: Negative for cough, chest tightness and shortness of breath.   Cardiovascular: Negative for chest  pain, palpitations and leg swelling.  Gastrointestinal: Negative for abdominal distention, abdominal pain, constipation, diarrhea, nausea and vomiting.  Neurological: Negative.   Psychiatric/Behavioral: Positive for decreased concentration and sleep disturbance. Negative for self-injury and suicidal ideas. The patient is nervous/anxious.     Per HPI unless specifically indicated above     Objective:    BP 136/88 (BP Location: Left Arm)   Pulse 90 Comment: pt reported   Temp (!) 97.3 F (36.3 C) (Temporal) Comment: pt reported  Wt Readings from Last 3 Encounters:  05/29/18 276 lb 1.6 oz (125.2 kg)  05/06/18 277 lb (125.6 kg)  01/30/18 265 lb (120.2 kg)    Physical Exam Vitals signs and nursing note reviewed.  Constitutional:      General: She is awake. She is not in acute distress.    Appearance: She is well-developed. She is not ill-appearing.  HENT:     Head: Normocephalic.     Right Ear: Hearing normal.     Left Ear: Hearing normal.  Eyes:     General: Lids are normal.        Right eye: No discharge.        Left eye: No discharge.     Conjunctiva/sclera: Conjunctivae normal.  Neck:     Musculoskeletal: Normal range of motion.  Cardiovascular:     Comments: Unable to auscultate due to virtual exam only  Pulmonary:     Effort: Pulmonary effort is normal. No accessory muscle usage or respiratory distress.     Comments: Unable to auscultate due to virtual exam only   Neurological:     Mental Status: She is alert and oriented to person, place, and time.  Psychiatric:        Attention and Perception: Attention normal.        Mood and Affect: Mood normal.        Behavior: Behavior normal. Behavior is cooperative.        Thought Content: Thought content normal.        Judgment: Judgment normal.     Results for orders placed or performed in visit on 08/09/17  Microscopic Examination   URINE  Result Value Ref Range   WBC, UA None seen 0 - 5 /hpf   RBC, UA 0-2 0 - 2 /hpf   Epithelial Cells (non renal) 0-10 0 - 10 /hpf   Bacteria, UA Few None seen/Few  CBC with Differential/Platelet  Result Value Ref Range   WBC 7.5 3.4 - 10.8 x10E3/uL   RBC 4.41 3.77 - 5.28 x10E6/uL   Hemoglobin 12.7 11.1 - 15.9 g/dL   Hematocrit 38.9 34.0 - 46.6 %   MCV 88 79 - 97 fL   MCH 28.8 26.6 - 33.0 pg   MCHC 32.6 31.5 - 35.7 g/dL   RDW 13.3 12.3 - 15.4 %   Platelets 346 150 - 379 x10E3/uL   Neutrophils 64 Not Estab. %   Lymphs 29 Not Estab. %    Monocytes 6 Not Estab. %   Eos 1 Not Estab. %   Basos 0 Not Estab. %   Neutrophils Absolute 4.7 1.4 - 7.0 x10E3/uL   Lymphocytes Absolute 2.2 0.7 - 3.1 x10E3/uL   Monocytes Absolute 0.5 0.1 - 0.9 x10E3/uL   EOS (ABSOLUTE) 0.1 0.0 - 0.4 x10E3/uL   Basophils Absolute 0.0 0.0 - 0.2 x10E3/uL   Immature Granulocytes 0 Not Estab. %   Immature Grans (Abs) 0.0 0.0 - 0.1 x10E3/uL  Comprehensive metabolic panel  Result Value Ref  Range   Glucose 89 65 - 99 mg/dL   BUN 12 6 - 24 mg/dL   Creatinine, Ser 0.76 0.57 - 1.00 mg/dL   GFR calc non Af Amer 96 >59 mL/min/1.73   GFR calc Af Amer 111 >59 mL/min/1.73   BUN/Creatinine Ratio 16 9 - 23   Sodium 142 134 - 144 mmol/L   Potassium 4.3 3.5 - 5.2 mmol/L   Chloride 103 96 - 106 mmol/L   CO2 21 20 - 29 mmol/L   Calcium 9.3 8.7 - 10.2 mg/dL   Total Protein 7.5 6.0 - 8.5 g/dL   Albumin 4.6 3.5 - 5.5 g/dL   Globulin, Total 2.9 1.5 - 4.5 g/dL   Albumin/Globulin Ratio 1.6 1.2 - 2.2   Bilirubin Total 0.4 0.0 - 1.2 mg/dL   Alkaline Phosphatase 49 39 - 117 IU/L   AST 32 0 - 40 IU/L   ALT 53 (H) 0 - 32 IU/L  Lipid Panel w/o Chol/HDL Ratio  Result Value Ref Range   Cholesterol, Total 247 (H) 100 - 199 mg/dL   Triglycerides 265 (H) 0 - 149 mg/dL   HDL 32 (L) >39 mg/dL   VLDL Cholesterol Cal 53 (H) 5 - 40 mg/dL   LDL Calculated 162 (H) 0 - 99 mg/dL  UA/M w/rflx Culture, Routine   Specimen: Urine   URINE  Result Value Ref Range   Specific Gravity, UA 1.015 1.005 - 1.030   pH, UA 7.0 5.0 - 7.5   Color, UA Orange Yellow   Appearance Ur Cloudy (A) Clear   Leukocytes, UA Negative Negative   Protein, UA Trace (A) Negative/Trace   Glucose, UA Negative Negative   Ketones, UA Negative Negative   RBC, UA Trace (A) Negative   Bilirubin, UA Negative Negative   Urobilinogen, Ur 0.2 0.2 - 1.0 mg/dL   Nitrite, UA Negative Negative   Microscopic Examination See below:   TSH  Result Value Ref Range   TSH 2.220 0.450 - 4.500 uIU/mL      Assessment & Plan:    Problem List Items Addressed This Visit      Other   Caregiver role strain    Chronic, ongoing with improvement in mood with increase in Sertraline and help in home.  Continue current medication regimen.  Return in 3 months to office for follow-up and labs.      Depression - Primary    Chronic, ongoing, has improved with increase in Sertraline.  Denies SI/HI.  Continue current medication regimen.  Return in 3 months for follow-up and labs in office.         I discussed the assessment and treatment plan with the patient. The patient was provided an opportunity to ask questions and all were answered. The patient agreed with the plan and demonstrated an understanding of the instructions.   The patient was advised to call back or seek an in-person evaluation if the symptoms worsen or if the condition fails to improve as anticipated.   I provided 15 minutes of time during this encounter.  Follow up plan: Return in about 3 months (around 01/24/2019) for Mood and HTN in office with labs.

## 2018-10-24 NOTE — Assessment & Plan Note (Signed)
Chronic, ongoing with improvement in mood with increase in Sertraline and help in home.  Continue current medication regimen.  Return in 3 months to office for follow-up and labs.

## 2018-11-04 ENCOUNTER — Other Ambulatory Visit: Payer: Self-pay | Admitting: General Practice

## 2018-11-07 MED ORDER — NORETHINDRONE ACETATE 5 MG PO TABS: 5.0000 mg | ORAL_TABLET | Freq: Every day | ORAL | 0 refills | Status: DC

## 2018-11-25 ENCOUNTER — Other Ambulatory Visit: Payer: Self-pay

## 2018-11-25 NOTE — Telephone Encounter (Signed)
Patient last seen 10/24/18 and has appointment 01/26/19.

## 2018-11-26 MED ORDER — BUPROPION HCL ER (XL) 150 MG PO TB24: 150.0000 mg | ORAL_TABLET | Freq: Every day | ORAL | 3 refills | Status: DC

## 2019-01-01 ENCOUNTER — Other Ambulatory Visit: Payer: Self-pay | Admitting: *Deleted

## 2019-01-01 MED ORDER — NORETHINDRONE ACETATE 5 MG PO TABS: 5.0000 mg | ORAL_TABLET | Freq: Every day | ORAL | 0 refills | Status: DC

## 2019-01-01 NOTE — Telephone Encounter (Signed)
Received a refill request fax from [facility] for Norethindrone. Will forward to provider.  Jacques Navy

## 2019-01-23 ENCOUNTER — Telehealth: Payer: Self-pay | Admitting: Nurse Practitioner

## 2019-01-23 NOTE — Telephone Encounter (Signed)
Copied from [place] [phone]. Topic: General - Other >> Jan 23, 2019  3:22 PM Rayann Heman wrote: Reason for CRM: pt called and stated that she would like to know if she needs to fast for mondays appointment.. please advise

## 2019-01-23 NOTE — Telephone Encounter (Signed)
Patient notified

## 2019-01-26 ENCOUNTER — Encounter: Payer: Self-pay | Admitting: Nurse Practitioner

## 2019-01-26 ENCOUNTER — Other Ambulatory Visit: Payer: Self-pay

## 2019-01-26 ENCOUNTER — Ambulatory Visit: Payer: Self-pay | Admitting: Nurse Practitioner

## 2019-01-26 ENCOUNTER — Ambulatory Visit (INDEPENDENT_AMBULATORY_CARE_PROVIDER_SITE_OTHER): Payer: Self-pay | Admitting: Nurse Practitioner

## 2019-01-26 VITALS — BP 128/81 | HR 88 | Temp 98.4°F | Ht 68.0 in | Wt 264.0 lb

## 2019-01-26 DIAGNOSIS — F321 Major depressive disorder, single episode, moderate: Secondary | ICD-10-CM

## 2019-01-26 DIAGNOSIS — Z6837 Body mass index (BMI) 37.0-37.9, adult: Secondary | ICD-10-CM

## 2019-01-26 DIAGNOSIS — E782 Mixed hyperlipidemia: Secondary | ICD-10-CM

## 2019-01-26 DIAGNOSIS — I10 Essential (primary) hypertension: Secondary | ICD-10-CM

## 2019-01-26 DIAGNOSIS — Z638 Other specified problems related to primary support group: Secondary | ICD-10-CM

## 2019-01-26 MED ORDER — NORETHINDRONE ACETATE 5 MG PO TABS: 5.0000 mg | ORAL_TABLET | Freq: Every day | ORAL | 2 refills | Status: DC

## 2019-01-26 MED ORDER — CYCLOBENZAPRINE HCL 10 MG PO TABS: 10.0000 mg | ORAL_TABLET | Freq: Every day | ORAL | 0 refills | Status: DC

## 2019-01-26 MED ORDER — SERTRALINE HCL 50 MG PO TABS: 75.0000 mg | ORAL_TABLET | Freq: Every day | ORAL | 3 refills | Status: DC

## 2019-01-26 NOTE — Assessment & Plan Note (Signed)
Recommend continued focus on health diet choices and regular physical activity (30 minutes 5 days a week). 

## 2019-01-26 NOTE — Assessment & Plan Note (Signed)
Chronic, ongoing with improvement in mood with increase in Sertraline and help in home.  Continue current medication regimen.  Return in 6 months.

## 2019-01-26 NOTE — Assessment & Plan Note (Signed)
Chronic, ongoing with no current statin.  Poor tolerance to Pravastatin in past.  CMP and lipid panel today.  Could consider Crestor if elevation in labs noted, 3 day week schedule.  Educated patient on diet.

## 2019-01-26 NOTE — Assessment & Plan Note (Signed)
Chronic, ongoing, has improved with increase in Sertraline.  Denies SI/HI.  Continue current medication regimen.  Return in 6 months.

## 2019-01-26 NOTE — Progress Notes (Signed)
BP 128/81 (BP Location: Left Arm, Patient Position: Sitting)   Pulse 88   Temp 98.4 F (36.9 C) (Oral)   Ht 5\' 8"  (1.727 m)   Wt 264 lb (119.7 kg)   SpO2 97%   BMI 40.14 kg/m    Subjective:    Patient ID: Stefanie Stewart, female    DOB: 1975/11/28, 43 y.o.   MRN: [id number]  HPI: Stefanie Stewart is a 43 y.o. female  Chief Complaint  Patient presents with  . Depression  . Hypertension  . Medication Refill    pt states most of the meds needs refills   DEPRESSION Currently taking Zoloft 75 MG and Wellbutrin 150 MG XL daily. Has caregiver role strain. Pt reports significant caregiver stress from caring for her ailing mother with Alzheimer disease. Pt states her father passed away in the past year and she is the sole caregiver for her mother. Does have helpers come into home, but her brother does not help.  Patient reports a 67 lb weight gain in the past year due to stressors. She reports excessive eating even when not hungry due to mood.  Recently did lose her job, worked in [employer].   Mood status: stable Satisfied with current treatment?: yes Symptom severity: moderate  Duration of current treatment : chronic Side effects: no Medication compliance: good compliance Psychotherapy/counseling: none, but cost was $100 an hour which she could not afford Previous psychiatric medications: Sertraline and Wellbutrin Depressed mood: yes Anxious mood: yes Anhedonia: no Significant weight loss or gain: yes Insomnia: none Fatigue: no Feelings of worthlessness or guilt: no Impaired concentration/indecisiveness: yes Suicidal ideations: no Hopelessness: no Crying spells: no Depression screen [facility] 2/9 01/26/2019 10/24/2018 05/29/2018 05/06/2018 01/30/2018  Decreased Interest 1 1 1 1 1   Down, Depressed, Hopeless 1 1 1 1  0  PHQ - 2 Score 2 2 2 2 1   Altered sleeping 1 2 3 2 2   Tired, decreased energy 0 0 1 1 1   Change in appetite 3 2 3 3 3   Feeling bad or failure about yourself  1 1 0 1  0  Trouble concentrating 0 1 0 0 0  Moving slowly or fidgety/restless 0 0 0 0 0  Suicidal thoughts 0 0 0 0 0  PHQ-9 Score 7 8 9 9 7   Difficult doing work/chores Somewhat difficult Somewhat difficult - Somewhat difficult -  Some recent data might be hidden   HYPERTENSION / HYPERLIPIDEMIA Currently takes HCTZ and turmeric.  No current statin.  Previously took Pravastatin with muscle pains. Satisfied with current treatment? yes Duration of hypertension: chronic BP monitoring frequency: rarely BP range: 130/80's at home BP medication side effects: no Duration of hyperlipidemia: chronic Aspirin: no Recent stressors: no Recurrent headaches: no Visual changes: no Palpitations: no Dyspnea: no Chest pain: no Lower extremity edema: no Dizzy/lightheaded: no  The 10-year ASCVD risk score Mikey Bussing DC Jr., et al., 2013) is: 12%   Values used to calculate the score:     Age: 59 years     Sex: Female     Is Non-Hispanic African American: No     Diabetic: No     Tobacco smoker: Yes     Systolic Blood Pressure: 0000000 mmHg     Is BP treated: Yes     HDL Cholesterol: 32 mg/dL     Total Cholesterol: 247 mg/dL    Relevant past medical, surgical, family and social history reviewed and updated as indicated. Interim medical history since our last  visit reviewed. Allergies and medications reviewed and updated.  Review of Systems  Constitutional: Negative for activity change, appetite change, diaphoresis, fatigue and fever.  Respiratory: Negative for cough, chest tightness and shortness of breath.   Cardiovascular: Negative for chest pain, palpitations and leg swelling.  Gastrointestinal: Negative for abdominal distention, abdominal pain, constipation, diarrhea, nausea and vomiting.  Endocrine: Negative for cold intolerance, heat intolerance, polydipsia, polyphagia and polyuria.  Neurological: Negative for dizziness, syncope, weakness, light-headedness, numbness and headaches.   Psychiatric/Behavioral: Negative.     Per HPI unless specifically indicated above     Objective:    BP 128/81 (BP Location: Left Arm, Patient Position: Sitting)   Pulse 88   Temp 98.4 F (36.9 C) (Oral)   Ht 5\' 8"  (1.727 m)   Wt 264 lb (119.7 kg)   SpO2 97%   BMI 40.14 kg/m   Wt Readings from Last 3 Encounters:  01/26/19 264 lb (119.7 kg)  05/29/18 276 lb 1.6 oz (125.2 kg)  05/06/18 277 lb (125.6 kg)    Physical Exam Vitals signs and nursing note reviewed.  Constitutional:      General: She is awake. She is not in acute distress.    Appearance: She is well-developed. She is obese. She is not ill-appearing.  HENT:     Head: Normocephalic.     Right Ear: Hearing normal.     Left Ear: Hearing normal.  Eyes:     General: Lids are normal.        Right eye: No discharge.        Left eye: No discharge.     Conjunctiva/sclera: Conjunctivae normal.     Pupils: Pupils are equal, round, and reactive to light.  Neck:     Musculoskeletal: Normal range of motion and neck supple.     Thyroid: No thyromegaly.     Vascular: No carotid bruit.  Cardiovascular:     Rate and Rhythm: Normal rate and regular rhythm.     Heart sounds: Normal heart sounds. No murmur. No gallop.   Pulmonary:     Effort: Pulmonary effort is normal. No accessory muscle usage or respiratory distress.     Breath sounds: Normal breath sounds.  Abdominal:     General: Bowel sounds are normal.     Palpations: Abdomen is soft. There is no hepatomegaly or splenomegaly.  Musculoskeletal:     Right lower leg: No edema.     Left lower leg: No edema.  Skin:    General: Skin is warm and dry.  Neurological:     Mental Status: She is alert and oriented to person, place, and time.  Psychiatric:        Attention and Perception: Attention normal.        Mood and Affect: Mood normal.        Behavior: Behavior normal. Behavior is cooperative.        Thought Content: Thought content normal.        Judgment: Judgment  normal.     Results for orders placed or performed in visit on 08/09/17  Microscopic Examination   URINE  Result Value Ref Range   WBC, UA None seen 0 - 5 /hpf   RBC, UA 0-2 0 - 2 /hpf   Epithelial Cells (non renal) 0-10 0 - 10 /hpf   Bacteria, UA Few None seen/Few  CBC with Differential/Platelet  Result Value Ref Range   WBC 7.5 3.4 - 10.8 x10E3/uL   RBC 4.41 3.77 - 5.28  x10E6/uL   Hemoglobin 12.7 11.1 - 15.9 g/dL   Hematocrit 38.9 34.0 - 46.6 %   MCV 88 79 - 97 fL   MCH 28.8 26.6 - 33.0 pg   MCHC 32.6 31.5 - 35.7 g/dL   RDW 13.3 12.3 - 15.4 %   Platelets 346 150 - 379 x10E3/uL   Neutrophils 64 Not Estab. %   Lymphs 29 Not Estab. %   Monocytes 6 Not Estab. %   Eos 1 Not Estab. %   Basos 0 Not Estab. %   Neutrophils Absolute 4.7 1.4 - 7.0 x10E3/uL   Lymphocytes Absolute 2.2 0.7 - 3.1 x10E3/uL   Monocytes Absolute 0.5 0.1 - 0.9 x10E3/uL   EOS (ABSOLUTE) 0.1 0.0 - 0.4 x10E3/uL   Basophils Absolute 0.0 0.0 - 0.2 x10E3/uL   Immature Granulocytes 0 Not Estab. %   Immature Grans (Abs) 0.0 0.0 - 0.1 x10E3/uL  Comprehensive metabolic panel  Result Value Ref Range   Glucose 89 65 - 99 mg/dL   BUN 12 6 - 24 mg/dL   Creatinine, Ser 0.76 0.57 - 1.00 mg/dL   GFR calc non Af Amer 96 >59 mL/min/1.73   GFR calc Af Amer 111 >59 mL/min/1.73   BUN/Creatinine Ratio 16 9 - 23   Sodium 142 134 - 144 mmol/L   Potassium 4.3 3.5 - 5.2 mmol/L   Chloride 103 96 - 106 mmol/L   CO2 21 20 - 29 mmol/L   Calcium 9.3 8.7 - 10.2 mg/dL   Total Protein 7.5 6.0 - 8.5 g/dL   Albumin 4.6 3.5 - 5.5 g/dL   Globulin, Total 2.9 1.5 - 4.5 g/dL   Albumin/Globulin Ratio 1.6 1.2 - 2.2   Bilirubin Total 0.4 0.0 - 1.2 mg/dL   Alkaline Phosphatase 49 39 - 117 IU/L   AST 32 0 - 40 IU/L   ALT 53 (H) 0 - 32 IU/L  Lipid Panel w/o Chol/HDL Ratio  Result Value Ref Range   Cholesterol, Total 247 (H) 100 - 199 mg/dL   Triglycerides 265 (H) 0 - 149 mg/dL   HDL 32 (L) >39 mg/dL   VLDL Cholesterol Cal 53 (H) 5 - 40  mg/dL   LDL Calculated 162 (H) 0 - 99 mg/dL  UA/M w/rflx Culture, Routine   Specimen: Urine   URINE  Result Value Ref Range   Specific Gravity, UA 1.015 1.005 - 1.030   pH, UA 7.0 5.0 - 7.5   Color, UA Orange Yellow   Appearance Ur Cloudy (A) Clear   Leukocytes, UA Negative Negative   Protein, UA Trace (A) Negative/Trace   Glucose, UA Negative Negative   Ketones, UA Negative Negative   RBC, UA Trace (A) Negative   Bilirubin, UA Negative Negative   Urobilinogen, Ur 0.2 0.2 - 1.0 mg/dL   Nitrite, UA Negative Negative   Microscopic Examination See below:   TSH  Result Value Ref Range   TSH 2.220 0.450 - 4.500 uIU/mL      Assessment & Plan:   Problem List Items Addressed This Visit      Cardiovascular and Mediastinum   Hypertension    Chronic with BP below goal on recheck.  At this time recommend DASH diet and focus on activity, such as walking, which would benefit mood and overall physical health.  Recommend checking BP at home at least a few mornings a week and documenting.  Return in 6 months.      Relevant Orders   Comprehensive metabolic panel  Other   Caregiver role strain    Chronic, ongoing with improvement in mood with increase in Sertraline and help in home.  Continue current medication regimen.  Return in 6 months.        Obesity    Recommend continued focus on health diet choices and regular physical activity (30 minutes 5 days a week).       Depression - Primary    Chronic, ongoing, has improved with increase in Sertraline.  Denies SI/HI.  Continue current medication regimen.  Return in 6 months.      Relevant Medications   sertraline (ZOLOFT) 50 MG tablet   Mixed hyperlipidemia    Chronic, ongoing with no current statin.  Poor tolerance to Pravastatin in past.  CMP and lipid panel today.  Could consider Crestor if elevation in labs noted, 3 day week schedule.  Educated patient on diet.        Relevant Orders   Comprehensive metabolic panel    Lipid Panel w/o Chol/HDL Ratio       Follow up plan: Return in about 6 months (around 07/26/2019) for Mood and HTN/HLD.

## 2019-01-26 NOTE — Patient Instructions (Signed)
Living With Depression Everyone experiences occasional disappointment, sadness, and loss in their lives. When you are feeling down, blue, or sad for at least 2 weeks in a row, it may mean that you have depression. Depression can affect your thoughts and feelings, relationships, daily activities, and physical health. It is caused by changes in the way your brain functions. If you receive a diagnosis of depression, your health care provider will tell you which type of depression you have and what treatment options are available to you. If you are living with depression, there are ways to help you recover from it and also ways to prevent it from coming back. How to cope with lifestyle changes Coping with stress     Stress is your body's reaction to life changes and events, both good and bad. Stressful situations may include:  Getting married.  The death of a spouse.  Losing a job.  Retiring.  Having a baby. Stress can last just a few hours or it can be ongoing. Stress can play a major role in depression, so it is important to learn both how to cope with stress and how to think about it differently. Talk with your health care provider or a counselor if you would like to learn more about stress reduction. He or she may suggest some stress reduction techniques, such as:  Music therapy. This can include creating music or listening to music. Choose music that you enjoy and that inspires you.  Mindfulness-based meditation. This kind of meditation can be done while sitting or walking. It involves being aware of your normal breaths, rather than trying to control your breathing.  Centering prayer. This is a kind of meditation that involves focusing on a spiritual word or phrase. Choose a word, phrase, or sacred image that is meaningful to you and that brings you peace.  Deep breathing. To do this, expand your stomach and inhale slowly through your nose. Hold your breath for 3-5 seconds, then exhale  slowly, allowing your stomach muscles to relax.  Muscle relaxation. This involves intentionally tensing muscles then relaxing them. Choose a stress reduction technique that fits your lifestyle and personality. Stress reduction techniques take time and practice to develop. Set aside 5-15 minutes a day to do them. Therapists can offer training in these techniques. The training may be covered by some insurance plans. Other things you can do to manage stress include:  Keeping a stress diary. This can help you learn what triggers your stress and ways to control your response.  Understanding what your limits are and saying no to requests or events that lead to a schedule that is too full.  Thinking about how you respond to certain situations. You may not be able to control everything, but you can control how you react.  Adding humor to your life by watching funny films or TV shows.  Making time for activities that help you relax and not feeling guilty about spending your time this way.  Medicines Your health care provider may suggest certain medicines if he or she feels that they will help improve your condition. Avoid using alcohol and other substances that may prevent your medicines from working properly (may interact). It is also important to:  Talk with your pharmacist or health care provider about all the medicines that you take, their possible side effects, and what medicines are safe to take together.  Make it your goal to take part in all treatment decisions (shared decision-making). This includes giving input on   the side effects of medicines. It is best if shared decision-making with your health care provider is part of your total treatment plan. If your health care provider prescribes a medicine, you may not notice the full benefits of it for 4-8 weeks. Most people who are treated for depression need to be on medicine for at least 6-12 months after they feel better. If you are taking  medicines as part of your treatment, do not stop taking medicines without first talking to your health care provider. You may need to have the medicine slowly decreased (tapered) over time to decrease the risk of harmful side effects. Relationships Your health care provider may suggest family therapy along with individual therapy and drug therapy. While there may not be family problems that are causing you to feel depressed, it is still important to make sure your family learns as much as they can about your mental health. Having your family's support can help make your treatment successful. How to recognize changes in your condition Everyone has a different response to treatment for depression. Recovery from major depression happens when you have not had signs of major depression for two months. This may mean that you will start to:  Have more interest in doing activities.  Feel less hopeless than you did 2 months ago.  Have more energy.  Overeat less often, or have better or improving appetite.  Have better concentration. Your health care provider will work with you to decide the next steps in your recovery. It is also important to recognize when your condition is getting worse. Watch for these signs:  Having fatigue or low energy.  Eating too much or too little.  Sleeping too much or too little.  Feeling restless, agitated, or hopeless.  Having trouble concentrating or making decisions.  Having unexplained physical complaints.  Feeling irritable, angry, or aggressive. Get help as soon as you or your family members notice these symptoms coming back. How to get support and help from others How to talk with friends and family members about your condition  Talking to friends and family members about your condition can provide you with one way to get support and guidance. Reach out to trusted friends or family members, explain your symptoms to them, and let them know that you are  working with a health care provider to treat your depression. Financial resources Not all insurance plans cover mental health care, so it is important to check with your insurance carrier. If paying for co-pays or counseling services is a problem, search for a local or county mental health care center. They may be able to offer public mental health care services at low or no cost when you are not able to see a private health care provider. If you are taking medicine for depression, you may be able to get the generic form, which may be less expensive. Some makers of prescription medicines also offer help to patients who cannot afford the medicines they need. Follow these instructions at home:   Get the right amount and quality of sleep.  Cut down on using caffeine, tobacco, alcohol, and other potentially harmful substances.  Try to exercise, such as walking or lifting small weights.  Take over-the-counter and prescription medicines only as told by your health care provider.  Eat a healthy diet that includes plenty of vegetables, fruits, whole grains, low-fat dairy products, and lean protein. Do not eat a lot of foods that are high in solid fats, added sugars, or salt.    Keep all follow-up visits as told by your health care provider. This is important. Contact a health care provider if:  You stop taking your antidepressant medicines, and you have any of these symptoms: ? Nausea. ? Headache. ? Feeling lightheaded. ? Chills and body aches. ? Not being able to sleep (insomnia).  You or your friends and family think your depression is getting worse. Get help right away if:  You have thoughts of hurting yourself or others. If you ever feel like you may hurt yourself or others, or have thoughts about taking your own life, get help right away. You can go to your nearest emergency department or call:  Your local emergency services (911 in the [place]  A suicide crisis helpline, such as the  National Suicide Prevention Lifeline at 1-800-273-8255. This is open 24-hours a day. Summary  If you are living with depression, there are ways to help you recover from it and also ways to prevent it from coming back.  Work with your health care team to create a management plan that includes counseling, stress management techniques, and healthy lifestyle habits. This information is not intended to replace advice given to you by your health care provider. Make sure you discuss any questions you have with your health care provider. Document Released: 02/13/2016 Document Revised: 07/04/2018 Document Reviewed: 02/13/2016 Elsevier Patient Education  2020 [employer]  

## 2019-01-26 NOTE — Assessment & Plan Note (Signed)
Chronic with BP below goal on recheck.  At this time recommend DASH diet and focus on activity, such as walking, which would benefit mood and overall physical health.  Recommend checking BP at home at least a few mornings a week and documenting.  Return in 6 months.

## 2019-01-27 LAB — COMPREHENSIVE METABOLIC PANEL
ALT: 69 IU/L — ABNORMAL HIGH (ref 0–32)
AST: 51 IU/L — ABNORMAL HIGH (ref 0–40)
Albumin/Globulin Ratio: 1.7 (ref 1.2–2.2)
Albumin: 4.5 g/dL (ref 3.8–4.8)
Alkaline Phosphatase: 83 IU/L (ref 39–117)
BUN/Creatinine Ratio: 16 (ref 9–23)
BUN: 12 mg/dL (ref 6–24)
Bilirubin Total: 0.6 mg/dL (ref 0.0–1.2)
CO2: 21 mmol/L (ref 20–29)
Calcium: 9.5 mg/dL (ref 8.7–10.2)
Chloride: 102 mmol/L (ref 96–106)
Creatinine, Ser: 0.75 mg/dL (ref 0.57–1.00)
GFR calc Af Amer: 111 mL/min/{1.73_m2} (ref >59)
GFR calc non Af Amer: 97 mL/min/{1.73_m2} (ref >59)
Globulin, Total: 2.7 g/dL (ref 1.5–4.5)
Glucose: 294 mg/dL — ABNORMAL HIGH (ref 65–99)
Potassium: 4.7 mmol/L (ref 3.5–5.2)
Sodium: 139 mmol/L (ref 134–144)
Total Protein: 7.2 g/dL (ref 6.0–8.5)

## 2019-01-27 LAB — LIPID PANEL W/O CHOL/HDL RATIO
Cholesterol, Total: 299 mg/dL — ABNORMAL HIGH (ref 100–199)
HDL: 23 mg/dL — ABNORMAL LOW (ref >39)
LDL Chol Calc (NIH): 173 mg/dL — ABNORMAL HIGH (ref 0–99)
Triglycerides: 509 mg/dL — ABNORMAL HIGH (ref 0–149)
VLDL Cholesterol Cal: 103 mg/dL — ABNORMAL HIGH (ref 5–40)

## 2019-03-03 ENCOUNTER — Telehealth: Payer: Self-pay | Admitting: Family Medicine

## 2019-03-03 NOTE — Telephone Encounter (Signed)
Will leave for Jolene on her return

## 2019-03-03 NOTE — Telephone Encounter (Signed)
Copied from [place] [phone]. Topic: General - Other >> Mar 02, 2019  1:22 PM Lennox Solders wrote: Reason for CRM:pt is no longer working and can not afford office visit. Pt rsc her appt to 04/06/2019 and would like jolene to return her call concerning blood work

## 2019-03-05 NOTE — Telephone Encounter (Signed)
Spoke to patient on telephone.  We discussed her cholesterol level, would benefit from statin.  Poor tolerance of Pravastatin in past.  Discussed possibly using Crestor 3 to 1 time a week if ongoing elevation next visit.  She recently lost insurance and is losing her caregiver to her mom, so has a lot on plate at this time.  She plans to follow-up in January.

## 2019-03-10 ENCOUNTER — Ambulatory Visit: Payer: Self-pay | Admitting: Nurse Practitioner

## 2019-04-06 ENCOUNTER — Ambulatory Visit (INDEPENDENT_AMBULATORY_CARE_PROVIDER_SITE_OTHER): Payer: Self-pay | Admitting: Nurse Practitioner

## 2019-04-06 ENCOUNTER — Other Ambulatory Visit: Payer: Self-pay

## 2019-04-06 ENCOUNTER — Encounter: Payer: Self-pay | Admitting: Nurse Practitioner

## 2019-04-06 VITALS — BP 152/96 | HR 110

## 2019-04-06 DIAGNOSIS — I10 Essential (primary) hypertension: Secondary | ICD-10-CM

## 2019-04-06 DIAGNOSIS — E782 Mixed hyperlipidemia: Secondary | ICD-10-CM

## 2019-04-06 DIAGNOSIS — F321 Major depressive disorder, single episode, moderate: Secondary | ICD-10-CM

## 2019-04-06 MED ORDER — ROSUVASTATIN CALCIUM 10 MG PO TABS: ORAL_TABLET | ORAL | 3 refills | Status: DC

## 2019-04-06 MED ORDER — METHOCARBAMOL 500 MG PO TABS: 500.0000 mg | ORAL_TABLET | Freq: Three times a day (TID) | ORAL | 1 refills | Status: DC | PRN

## 2019-04-06 NOTE — Progress Notes (Signed)
BP (!) 152/96   Pulse (!) 110    Subjective:    Patient ID: Stefanie Stewart, female    DOB: 08-16-1975, 44 y.o.   MRN: [id number]  HPI: Stefanie Stewart is a 44 y.o. female  Chief Complaint  Patient presents with  . Follow-up    6 week     . This visit was completed via telephone due to the restrictions of the COVID-19 pandemic. All issues as above were discussed and addressed but no physical exam was performed. If it was felt that the patient should be evaluated in the office, they were directed there. The patient verbally consented to this visit. Patient was unable to complete an audio/visual visit due to Technical difficulties,Lack of internet. Due to the catastrophic nature of the COVID-19 pandemic, this visit was done through audio contact only. . Location of the patient: home . Location of the provider: work . Those involved with this call:  . Provider: Marnee Guarneri, DNP . CMA: Stefanie Stewart, CMA . Front Desk/Registration: Don Perking  . Time spent on call: 15 minutes on the phone discussing health concerns. 10 minutes total spent in review of patient's record and preparation of their chart.  . I verified patient identity using two factors (patient name and date of birth). Patient consents verbally to being seen via telemedicine visit today.    DEPRESSION Currently taking Zoloft 75 MG and Wellbutrin 150 MG XL daily. Has caregiver role strain. Pt reports significant caregiver stress from caring for her ailing mother with Alzheimer disease. Pt states her father passed away in the past year and she is the sole caregiver for her mother. Does have helpers come into home, but her brother does not help. 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   Mood status: stable Satisfied with current treatment?: yes Symptom severity: moderate  Duration of current treatment : chronic Side effects: no Medication  compliance: good compliance Psychotherapy/counseling: none, but cost was $100 an hour which she could not afford Previous psychiatric medications: Sertraline and Wellbutrin Depressed mood: yes Anxious mood: yes Anhedonia: no Significant weight loss or gain: yes Insomnia: none Fatigue: no Feelings of worthlessness or guilt: no Impaired concentration/indecisiveness: yes Suicidal ideations: no Hopelessness: no Crying spells: no Depression screen [facility] 2/9 04/06/2019 04/06/2019 01/26/2019 10/24/2018 05/29/2018  Decreased Interest 0 0 1 1 1   Down, Depressed, Hopeless 1 1 1 1 1   PHQ - 2 Score 1 1 2 2 2   Altered sleeping 0 - 1 2 3   Tired, decreased energy 0 - 0 0 1  Change in appetite 3 - 3 2 3   Feeling bad or failure about yourself  0 - 1 1 0  Trouble concentrating 3 - 0 1 0  Moving slowly or fidgety/restless 0 - 0 0 0  Suicidal thoughts 0 - 0 0 0  PHQ-9 Score 7 - 7 8 9   Difficult doing work/chores - - Somewhat difficult Somewhat difficult -  Some recent data might be hidden   GAD 7 : Generalized Anxiety Score 04/06/2019 10/24/2018 05/29/2018 05/06/2018  Nervous, Anxious, on Edge 3 0 1 1  Control/stop worrying 1 1 1 1   Worry too much - different things 2 1 1 1   Trouble relaxing 3 2 0 1  Restless 0 0 0 0  Easily annoyed or irritable 3 3 1 3   Afraid - awful might happen 1 2 0 1  Total  GAD 7 Score 13 9 4 8   Anxiety Difficulty - Somewhat difficult - Somewhat difficult    HYPERTENSION / HYPERLIPIDEMIA Currently takes HCTZ and turmeric.  No current statin.  Previously took Pravastatin with muscle pains. Satisfied with current treatment? yes Duration of hypertension: chronic BP monitoring frequency: rarely BP range: 130/80's at home on average, higher this morning because she was moving and showering/rushing BP medication side effects: no Duration of hyperlipidemia: chronic Aspirin: no Recent stressors: no Recurrent headaches: no Visual changes: no Palpitations: no Dyspnea: no Chest  pain: no Lower extremity edema: no Dizzy/lightheaded: no  The 10-year ASCVD risk score Mikey Bussing DC Jr., et al., 2013) is: 34.5%   Values used to calculate the score:     Age: 66 years     Sex: Female     Is Non-Hispanic African American: No     Diabetic: No     Tobacco smoker: Yes     Systolic Blood Pressure: 0000000 mmHg     Is BP treated: Yes     HDL Cholesterol: 23 mg/dL     Total Cholesterol: 299 mg/dL   Relevant past medical, surgical, family and social history reviewed and updated as indicated. Interim medical history since our last visit reviewed. Allergies and medications reviewed and updated.  Review of Systems  Constitutional: Negative for activity change, appetite change, diaphoresis, fatigue and fever.  Respiratory: Negative for cough, chest tightness and shortness of breath.   Cardiovascular: Negative for chest pain, palpitations and leg swelling.  Gastrointestinal: Negative.   Neurological: Negative.   Psychiatric/Behavioral: Negative.     Per HPI unless specifically indicated above     Objective:    BP (!) 152/96   Pulse (!) 110   Wt Readings from Last 3 Encounters:  01/26/19 264 lb (119.7 kg)  05/29/18 276 lb 1.6 oz (125.2 kg)  05/06/18 277 lb (125.6 kg)    Physical Exam Vitals and nursing note reviewed.  Constitutional:      General: She is awake. She is not in acute distress.    Appearance: She is well-developed. She is not ill-appearing.  HENT:     Head: Normocephalic.     Right Ear: Hearing normal.     Left Ear: Hearing normal.  Eyes:     General: Lids are normal.        Right eye: No discharge.        Left eye: No discharge.     Conjunctiva/sclera: Conjunctivae normal.  Pulmonary:     Effort: Pulmonary effort is normal. No accessory muscle usage or respiratory distress.  Musculoskeletal:     Cervical back: Normal range of motion.  Neurological:     Mental Status: She is alert and oriented to person, place, and time.  Psychiatric:         Attention and Perception: Attention normal.        Mood and Affect: Mood normal.        Behavior: Behavior normal. Behavior is cooperative.        Thought Content: Thought content normal.        Judgment: Judgment normal.    Results for orders placed or performed in visit on 01/26/19  Comprehensive metabolic panel  Result Value Ref Range   Glucose 294 (H) 65 - 99 mg/dL   BUN 12 6 - 24 mg/dL   Creatinine, Ser 0.75 0.57 - 1.00 mg/dL   GFR calc non Af Amer 97 >59 mL/min/1.73   GFR calc Af Amer 111 >59  mL/min/1.73   BUN/Creatinine Ratio 16 9 - 23   Sodium 139 134 - 144 mmol/L   Potassium 4.7 3.5 - 5.2 mmol/L   Chloride 102 96 - 106 mmol/L   CO2 21 20 - 29 mmol/L   Calcium 9.5 8.7 - 10.2 mg/dL   Total Protein 7.2 6.0 - 8.5 g/dL   Albumin 4.5 3.8 - 4.8 g/dL   Globulin, Total 2.7 1.5 - 4.5 g/dL   Albumin/Globulin Ratio 1.7 1.2 - 2.2   Bilirubin Total 0.6 0.0 - 1.2 mg/dL   Alkaline Phosphatase 83 39 - 117 IU/L   AST 51 (H) 0 - 40 IU/L   ALT 69 (H) 0 - 32 IU/L  Lipid Panel w/o Chol/HDL Ratio  Result Value Ref Range   Cholesterol, Total 299 (H) 100 - 199 mg/dL   Triglycerides 509 (H) 0 - 149 mg/dL   HDL 23 (L) >39 mg/dL   VLDL Cholesterol Cal 103 (H) 5 - 40 mg/dL   LDL Chol Calc (NIH) 173 (H) 0 - 99 mg/dL      Assessment & Plan:   Problem List Items Addressed This Visit      Cardiovascular and Mediastinum   Hypertension    Chronic with BP slightly elevated on home readings today.  At this time recommend DASH diet and focus on activity, such as walking, which would benefit mood and overall physical health + continue HCTZ.  Recommend checking BP at home at least a few mornings a week and documenting.  Return in 6 weeks to check in office.      Relevant Medications   omega-3 acid ethyl esters (LOVAZA) 1 g capsule   rosuvastatin (CRESTOR) 10 MG tablet     Other   Depression - Primary    Chronic, ongoing with improvement in mood with increase in Sertraline and help in home.   Continue current medication regimen and adjust as needed.  Denies SI/HI.  Return in 6 weeks.        Mixed hyperlipidemia    Chronic, ongoing with no current statin.  Poor tolerance to Pravastatin in past.  LFT slightly elevated last check, will trial Crestor 3 days a week and determine if beneficial without ADR.  In office visit in 6 weeks for lab check and visit.      Relevant Medications   omega-3 acid ethyl esters (LOVAZA) 1 g capsule   rosuvastatin (CRESTOR) 10 MG tablet      I discussed the assessment and treatment plan with the patient. The patient was provided an opportunity to ask questions and all were answered. The patient agreed with the plan and demonstrated an understanding of the instructions.   The patient was advised to call back or seek an in-person evaluation if the symptoms worsen or if the condition fails to improve as anticipated.   I provided 15 minutes of time during this encounter.  Follow up plan: Return in about 6 weeks (around 05/18/2019) for HLD and Mood.

## 2019-04-06 NOTE — Assessment & Plan Note (Signed)
Chronic, ongoing with improvement in mood with increase in Sertraline and help in home.  Continue current medication regimen and adjust as needed.  Denies SI/HI.  Return in 6 weeks.

## 2019-04-06 NOTE — Assessment & Plan Note (Signed)
Chronic with BP slightly elevated on home readings today.  At this time recommend DASH diet and focus on activity, such as walking, which would benefit mood and overall physical health + continue HCTZ.  Recommend checking BP at home at least a few mornings a week and documenting.  Return in 6 weeks to check in office.

## 2019-04-06 NOTE — Patient Instructions (Signed)
Living With Depression Everyone experiences occasional disappointment, sadness, and loss in their lives. When you are feeling down, blue, or sad for at least 2 weeks in a row, it may mean that you have depression. Depression can affect your thoughts and feelings, relationships, daily activities, and physical health. It is caused by changes in the way your brain functions. If you receive a diagnosis of depression, your health care provider will tell you which type of depression you have and what treatment options are available to you. If you are living with depression, there are ways to help you recover from it and also ways to prevent it from coming back. How to cope with lifestyle changes Coping with stress     Stress is your body's reaction to life changes and events, both good and bad. Stressful situations may include:  Getting married.  The death of a spouse.  Losing a job.  Retiring.  Having a baby. Stress can last just a few hours or it can be ongoing. Stress can play a major role in depression, so it is important to learn both how to cope with stress and how to think about it differently. Talk with your health care provider or a counselor if you would like to learn more about stress reduction. He or she may suggest some stress reduction techniques, such as:  Music therapy. This can include creating music or listening to music. Choose music that you enjoy and that inspires you.  Mindfulness-based meditation. This kind of meditation can be done while sitting or walking. It involves being aware of your normal breaths, rather than trying to control your breathing.  Centering prayer. This is a kind of meditation that involves focusing on a spiritual word or phrase. Choose a word, phrase, or sacred image that is meaningful to you and that brings you peace.  Deep breathing. To do this, expand your stomach and inhale slowly through your nose. Hold your breath for 3-5 seconds, then exhale  slowly, allowing your stomach muscles to relax.  Muscle relaxation. This involves intentionally tensing muscles then relaxing them. Choose a stress reduction technique that fits your lifestyle and personality. Stress reduction techniques take time and practice to develop. Set aside 5-15 minutes a day to do them. Therapists can offer training in these techniques. The training may be covered by some insurance plans. Other things you can do to manage stress include:  Keeping a stress diary. This can help you learn what triggers your stress and ways to control your response.  Understanding what your limits are and saying no to requests or events that lead to a schedule that is too full.  Thinking about how you respond to certain situations. You may not be able to control everything, but you can control how you react.  Adding humor to your life by watching funny films or TV shows.  Making time for activities that help you relax and not feeling guilty about spending your time this way.  Medicines Your health care provider may suggest certain medicines if he or she feels that they will help improve your condition. Avoid using alcohol and other substances that may prevent your medicines from working properly (may interact). It is also important to:  Talk with your pharmacist or health care provider about all the medicines that you take, their possible side effects, and what medicines are safe to take together.  Make it your goal to take part in all treatment decisions (shared decision-making). This includes giving input on   the side effects of medicines. It is best if shared decision-making with your health care provider is part of your total treatment plan. If your health care provider prescribes a medicine, you may not notice the full benefits of it for 4-8 weeks. Most people who are treated for depression need to be on medicine for at least 6-12 months after they feel better. If you are taking  medicines as part of your treatment, do not stop taking medicines without first talking to your health care provider. You may need to have the medicine slowly decreased (tapered) over time to decrease the risk of harmful side effects. Relationships Your health care provider may suggest family therapy along with individual therapy and drug therapy. While there may not be family problems that are causing you to feel depressed, it is still important to make sure your family learns as much as they can about your mental health. Having your family's support can help make your treatment successful. How to recognize changes in your condition Everyone has a different response to treatment for depression. Recovery from major depression happens when you have not had signs of major depression for two months. This may mean that you will start to:  Have more interest in doing activities.  Feel less hopeless than you did 2 months ago.  Have more energy.  Overeat less often, or have better or improving appetite.  Have better concentration. Your health care provider will work with you to decide the next steps in your recovery. It is also important to recognize when your condition is getting worse. Watch for these signs:  Having fatigue or low energy.  Eating too much or too little.  Sleeping too much or too little.  Feeling restless, agitated, or hopeless.  Having trouble concentrating or making decisions.  Having unexplained physical complaints.  Feeling irritable, angry, or aggressive. Get help as soon as you or your family members notice these symptoms coming back. How to get support and help from others How to talk with friends and family members about your condition  Talking to friends and family members about your condition can provide you with one way to get support and guidance. Reach out to trusted friends or family members, explain your symptoms to them, and let them know that you are  working with a health care provider to treat your depression. Financial resources Not all insurance plans cover mental health care, so it is important to check with your insurance carrier. If paying for co-pays or counseling services is a problem, search for a local or county mental health care center. They may be able to offer public mental health care services at low or no cost when you are not able to see a private health care provider. If you are taking medicine for depression, you may be able to get the generic form, which may be less expensive. Some makers of prescription medicines also offer help to patients who cannot afford the medicines they need. Follow these instructions at home:   Get the right amount and quality of sleep.  Cut down on using caffeine, tobacco, alcohol, and other potentially harmful substances.  Try to exercise, such as walking or lifting small weights.  Take over-the-counter and prescription medicines only as told by your health care provider.  Eat a healthy diet that includes plenty of vegetables, fruits, whole grains, low-fat dairy products, and lean protein. Do not eat a lot of foods that are high in solid fats, added sugars, or salt.    Keep all follow-up visits as told by your health care provider. This is important. Contact a health care provider if:  You stop taking your antidepressant medicines, and you have any of these symptoms: ? Nausea. ? Headache. ? Feeling lightheaded. ? Chills and body aches. ? Not being able to sleep (insomnia).  You or your friends and family think your depression is getting worse. Get help right away if:  You have thoughts of hurting yourself or others. If you ever feel like you may hurt yourself or others, or have thoughts about taking your own life, get help right away. You can go to your nearest emergency department or call:  Your local emergency services (911 in the [place]  A suicide crisis helpline, such as the  National Suicide Prevention Lifeline at 1-800-273-8255. This is open 24-hours a day. Summary  If you are living with depression, there are ways to help you recover from it and also ways to prevent it from coming back.  Work with your health care team to create a management plan that includes counseling, stress management techniques, and healthy lifestyle habits. This information is not intended to replace advice given to you by your health care provider. Make sure you discuss any questions you have with your health care provider. Document Revised: 07/04/2018 Document Reviewed: 02/13/2016 Elsevier Patient Education  2020 [employer]  

## 2019-04-06 NOTE — Assessment & Plan Note (Signed)
Chronic, ongoing with no current statin.  Poor tolerance to Pravastatin in past.  LFT slightly elevated last check, will trial Crestor 3 days a week and determine if beneficial without ADR.  In office visit in 6 weeks for lab check and visit.

## 2019-05-18 ENCOUNTER — Ambulatory Visit: Payer: Self-pay | Admitting: Nurse Practitioner

## 2019-05-18 ENCOUNTER — Other Ambulatory Visit: Payer: Self-pay

## 2019-05-18 ENCOUNTER — Ambulatory Visit (INDEPENDENT_AMBULATORY_CARE_PROVIDER_SITE_OTHER): Payer: Self-pay | Admitting: Nurse Practitioner

## 2019-05-18 ENCOUNTER — Encounter: Payer: Self-pay | Admitting: Nurse Practitioner

## 2019-05-18 VITALS — BP 129/81 | HR 98 | Temp 98.2°F

## 2019-05-18 DIAGNOSIS — I10 Essential (primary) hypertension: Secondary | ICD-10-CM

## 2019-05-18 DIAGNOSIS — F321 Major depressive disorder, single episode, moderate: Secondary | ICD-10-CM

## 2019-05-18 DIAGNOSIS — N809 Endometriosis, unspecified: Secondary | ICD-10-CM

## 2019-05-18 DIAGNOSIS — Z6837 Body mass index (BMI) 37.0-37.9, adult: Secondary | ICD-10-CM

## 2019-05-18 DIAGNOSIS — E782 Mixed hyperlipidemia: Secondary | ICD-10-CM

## 2019-05-18 MED ORDER — NORETHINDRONE ACETATE 5 MG PO TABS: 10.0000 mg | ORAL_TABLET | Freq: Every day | ORAL | 1 refills | Status: DC

## 2019-05-18 NOTE — Patient Instructions (Signed)

## 2019-05-18 NOTE — Assessment & Plan Note (Signed)
Chronic, ongoing with no current statin.  Poor tolerance to Pravastatin in past.  LFT slightly elevated last check, will trial Crestor 3 days a week and determine if beneficial without ADR.  Return to office in 3 months and consider labs once patient able to afford cost.

## 2019-05-18 NOTE — Assessment & Plan Note (Signed)
Chronic, ongoing.  Continue current medication regimen and adjust as needed.  Denies SI/HI. Return to office in 3 months. 

## 2019-05-18 NOTE — Assessment & Plan Note (Signed)
Recommend continued focus on healthy diet choices and regular physical activity (30 minutes 5 days a week).  

## 2019-05-18 NOTE — Assessment & Plan Note (Signed)
Chronic, stable with BP at goal.  At this time recommend DASH diet and focus on activity, such as walking, which would benefit mood and overall physical health + continue HCTZ.  Recommend checking BP at home at least a few mornings a week and documenting.  Return in 3 months.

## 2019-05-18 NOTE — Progress Notes (Signed)
BP 129/81   Pulse 98   Temp 98.2 F (36.8 C) (Oral)   SpO2 97%    Subjective:    Patient ID: Stefanie Stewart, female    DOB: 29-Jan-1976, 44 y.o.   MRN: [id number]  HPI: Stefanie Stewart is a 44 y.o. female  Chief Complaint  Patient presents with  . Depression  . Hyperlipidemia   HYPERTENSION / HYPERLIPIDEMIA Continues on Crestor 10 MG and HCTZ. Satisfied with current treatment? yes Duration of hypertension: chronic BP monitoring frequency: not checking BP range:  BP medication side effects: no Duration of hyperlipidemia: chronic Cholesterol medication side effects: no Cholesterol supplements: none Medication compliance: fair compliance - at times forgets Aspirin: no Recent stressors: no Recurrent headaches: no Visual changes: no Palpitations: no Dyspnea: no Chest pain: no Lower extremity edema: no Dizzy/lightheaded: no  The 10-year ASCVD risk score Mikey Bussing DC Jr., et al., 2013) is: 26.2%   Values used to calculate the score:     Age: 30 years     Sex: Female     Is Non-Hispanic African American: No     Diabetic: No     Tobacco smoker: Yes     Systolic Blood Pressure: [id number] mmHg     Is BP treated: Yes     HDL Cholesterol: 23 mg/dL     Total Cholesterol: 299 mg/dL   ENDOMETRIOSIS: Has not had period in some time due to Aygestin, takes 5 MG a day.  Recently, prior to Valentine's Day, had first menstrual cycle she has had in one year.  Reports it started out brown and then turned into a "usual period" with her endometriosis symptoms (diarrhea, pain, etc.).  She was offered ablation in past and went to see provider about hysterectomy, robotic surgery, went through financial discussions and then Covid hit. Her GYN is Dr. Willis Modena.  Has been taking Aygestin 10 MG now and feels this has been helpful.   DEPRESSION Currently taking Zoloft 75 MG and Wellbutrin 150 MG XL daily. Has caregiver role strain. Pt reports significant caregiver stress from caring for her ailing mother  with Alzheimer disease. Pt states her father passed away in the past year and she is the sole caregiver for her mother. Does have helpers come into home, but her brother does not help. Sold her house, which was major stressor.   Mood status: stable Satisfied with current treatment?: yes Symptom severity: mild  Duration of current treatment : chronic Side effects: no Medication compliance: fair compliance -- at times forgets Psychotherapy/counseling: none Depressed mood: yes Anxious mood: yes Anhedonia: no Significant weight loss or gain: no Insomnia: yes hard to fall asleep Fatigue: no Feelings of worthlessness or guilt: no Impaired concentration/indecisiveness: no Suicidal ideations: no Hopelessness: no Crying spells: no Depression screen [facility] 2/9 05/18/2019 04/06/2019 04/06/2019 01/26/2019 10/24/2018  Decreased Interest 1 0 0 1 1  Down, Depressed, Hopeless 1 1 1 1 1   PHQ - 2 Score 2 1 1 2 2   Altered sleeping 1 0 - 1 2  Tired, decreased energy 1 0 - 0 0  Change in appetite 3 3 - 3 2  Feeling bad or failure about yourself  0 0 - 1 1  Trouble concentrating 0 3 - 0 1  Moving slowly or fidgety/restless 0 0 - 0 0  Suicidal thoughts 0 0 - 0 0  PHQ-9 Score 7 7 - 7 8  Difficult doing work/chores Not difficult at all - - Somewhat difficult Somewhat difficult  Some recent  data might be hidden    Relevant past medical, surgical, family and social history reviewed and updated as indicated. Interim medical history since our last visit reviewed. Allergies and medications reviewed and updated.  Review of Systems  Constitutional: Negative for activity change, appetite change, diaphoresis, fatigue and fever.  Respiratory: Negative for cough, chest tightness and shortness of breath.   Cardiovascular: Negative for chest pain, palpitations and leg swelling.  Gastrointestinal: Negative.   Endocrine: Negative for polyuria.  Genitourinary: Positive for vaginal bleeding (menstrual cycle).   Neurological: Negative for dizziness, syncope, weakness, light-headedness, numbness and headaches.  Psychiatric/Behavioral: Negative.     Per HPI unless specifically indicated above     Objective:    BP 129/81   Pulse 98   Temp 98.2 F (36.8 C) (Oral)   SpO2 97%   Wt Readings from Last 3 Encounters:  01/26/19 264 lb (119.7 kg)  05/29/18 276 lb 1.6 oz (125.2 kg)  05/06/18 277 lb (125.6 kg)    Physical Exam Vitals and nursing note reviewed.  Constitutional:      General: She is awake. She is not in acute distress.    Appearance: She is well-developed. She is obese. She is not ill-appearing.  HENT:     Head: Normocephalic.     Right Ear: Hearing normal.     Left Ear: Hearing normal.  Eyes:     General: Lids are normal.        Right eye: No discharge.        Left eye: No discharge.     Conjunctiva/sclera: Conjunctivae normal.     Pupils: Pupils are equal, round, and reactive to light.  Neck:     Thyroid: No thyromegaly.     Vascular: No carotid bruit.  Cardiovascular:     Rate and Rhythm: Normal rate and regular rhythm.     Heart sounds: Normal heart sounds. No murmur. No gallop.   Pulmonary:     Effort: Pulmonary effort is normal. No accessory muscle usage or respiratory distress.     Breath sounds: Normal breath sounds.  Abdominal:     General: Bowel sounds are normal.     Palpations: Abdomen is soft.  Musculoskeletal:     Cervical back: Normal range of motion and neck supple.     Right lower leg: No edema.     Left lower leg: No edema.  Skin:    General: Skin is warm and dry.  Neurological:     Mental Status: She is alert and oriented to person, place, and time.  Psychiatric:        Attention and Perception: Attention normal.        Mood and Affect: Mood normal.        Behavior: Behavior normal. Behavior is cooperative.        Thought Content: Thought content normal.        Judgment: Judgment normal.     Results for orders placed or performed in visit  on 01/26/19  Comprehensive metabolic panel  Result Value Ref Range   Glucose 294 (H) 65 - 99 mg/dL   BUN 12 6 - 24 mg/dL   Creatinine, Ser 0.75 0.57 - 1.00 mg/dL   GFR calc non Af Amer 97 >59 mL/min/1.73   GFR calc Af Amer 111 >59 mL/min/1.73   BUN/Creatinine Ratio 16 9 - 23   Sodium 139 134 - 144 mmol/L   Potassium 4.7 3.5 - 5.2 mmol/L   Chloride 102 96 - 106 mmol/L  CO2 21 20 - 29 mmol/L   Calcium 9.5 8.7 - 10.2 mg/dL   Total Protein 7.2 6.0 - 8.5 g/dL   Albumin 4.5 3.8 - 4.8 g/dL   Globulin, Total 2.7 1.5 - 4.5 g/dL   Albumin/Globulin Ratio 1.7 1.2 - 2.2   Bilirubin Total 0.6 0.0 - 1.2 mg/dL   Alkaline Phosphatase 83 39 - 117 IU/L   AST 51 (H) 0 - 40 IU/L   ALT 69 (H) 0 - 32 IU/L  Lipid Panel w/o Chol/HDL Ratio  Result Value Ref Range   Cholesterol, Total 299 (H) 100 - 199 mg/dL   Triglycerides 509 (H) 0 - 149 mg/dL   HDL 23 (L) >39 mg/dL   VLDL Cholesterol Cal 103 (H) 5 - 40 mg/dL   LDL Chol Calc (NIH) 173 (H) 0 - 99 mg/dL      Assessment & Plan:   Problem List Items Addressed This Visit      Cardiovascular and Mediastinum   Hypertension    Chronic, stable with BP at goal.  At this time recommend DASH diet and focus on activity, such as walking, which would benefit mood and overall physical health + continue HCTZ.  Recommend checking BP at home at least a few mornings a week and documenting.  Return in 3 months.        Other   Endometriosis    Chronic, ongoing.  Will increase Aygestin to 10 MG daily, she has been doing this with improvement.  Script sent.  Recommend she return to GYN if further breakthrough.  She wishes to have a hysterectomy in the long term.      Obesity    Recommend continued focus on healthy diet choices and regular physical activity (30 minutes 5 days a week).       Depression - Primary    Chronic, ongoing.  Continue current medication regimen and adjust as needed.  Denies SI/HI.  Return to office in 3 months.      Mixed hyperlipidemia     Chronic, ongoing with no current statin.  Poor tolerance to Pravastatin in past.  LFT slightly elevated last check, will trial Crestor 3 days a week and determine if beneficial without ADR.  Return to office in 3 months and consider labs once patient able to afford cost.          Follow up plan: Return in about 3 months (around 08/15/2019) for HTN/HLD, Mood, Endometriosis.

## 2019-05-18 NOTE — Assessment & Plan Note (Signed)
Chronic, ongoing.  Will increase Aygestin to 10 MG daily, she has been doing this with improvement.  Script sent.  Recommend she return to GYN if further breakthrough.  She wishes to have a hysterectomy in the long term.

## 2019-06-04 ENCOUNTER — Other Ambulatory Visit: Payer: Self-pay | Admitting: Nurse Practitioner

## 2019-06-04 DIAGNOSIS — Z1231 Encounter for screening mammogram for malignant neoplasm of breast: Secondary | ICD-10-CM

## 2019-07-01 ENCOUNTER — Other Ambulatory Visit: Payer: Self-pay

## 2019-07-01 DIAGNOSIS — Z1231 Encounter for screening mammogram for malignant neoplasm of breast: Secondary | ICD-10-CM

## 2019-07-30 ENCOUNTER — Ambulatory Visit: Payer: Self-pay | Admitting: Medical

## 2019-07-30 ENCOUNTER — Other Ambulatory Visit: Payer: Self-pay

## 2019-07-30 ENCOUNTER — Ambulatory Visit
Admission: RE | Admit: 2019-07-30 | Discharge: 2019-07-30 | Disposition: A | Discharge status: Home / Self Care | Payer: No Typology Code available for payment source | Source: Ambulatory Visit | Attending: Obstetrics and Gynecology | Admitting: Obstetrics and Gynecology

## 2019-07-30 VITALS — BP 140/94 | Temp 97.0°F | Wt 256.0 lb

## 2019-07-30 DIAGNOSIS — Z1231 Encounter for screening mammogram for malignant neoplasm of breast: Secondary | ICD-10-CM

## 2019-07-30 NOTE — Patient Instructions (Signed)
Mammogram °A mammogram is an X-ray of the breasts that is done to check for changes that are not normal. This test can screen for and find any changes that may suggest breast cancer. Mammograms are regularly done on women. A man may have a mammogram if he has a lump or swelling in his breast. This test can also help to find other changes and variations in the breast. °Tell a doctor: °· About any allergies you have. °· If you have breast implants. °· If you have had previous breast disease, biopsy, or surgery. °· If you are breastfeeding. °· If you are younger than age 25. °· If you have a family history of breast cancer. °· Whether you are pregnant or may be pregnant. °What are the risks? °Generally, this is a safe procedure. However, problems may occur, including: °· Exposure to radiation. Radiation levels are very low with this test. °· The results being misinterpreted. °· The need for further tests. °· The inability of the mammogram to detect certain cancers. °What happens before the procedure? °· Have this test done about 1-2 weeks after your period. This is usually when your breasts are the least tender. °· If you are visiting a new doctor or clinic, send any past mammogram images to your new doctor's office. °· Wash your breasts and under your arms the day of the test. °· Do not use deodorants, perfumes, lotions, or powders on the day of the test. °· Take off any jewelry from your neck. °· Wear clothes that you can change into and out of easily. °What happens during the procedure? ° °· You will undress from the waist up. You will put on a gown. °· You will stand in front of the X-ray machine. °· Each breast will be placed between two plastic or glass plates. The plates will press down on your breast for a few seconds. Try to stay as relaxed as possible. This does not cause any harm to your breasts. Any discomfort you feel will be very brief. °· X-rays will be taken from different angles of each breast. °The  procedure may vary among doctors and hospitals. °What happens after the procedure? °· The mammogram will be read by a specialist (radiologist). °· You may need to do certain parts of the test again. This depends on the quality of the images. °· Ask when your test results will be ready. Make sure you get your test results. °· You may go back to your normal activities. °Summary °· A mammogram is a low energy X-ray of the breasts that is done to check for abnormal changes. A man may have this test if he has a lump or swelling in his breast. °· Before the procedure, tell your doctor about any breast problems that you have had in the past. °· Have this test done about 1-2 weeks after your period. °· For the test, each breast will be placed between two plastic or glass plates. The plates will press down on your breast for a few seconds. °· The mammogram will be read by a specialist (radiologist). Ask when your test results will be ready. Make sure you get your test results. °This information is not intended to replace advice given to you by your health care provider. Make sure you discuss any questions you have with your health care provider. °Document Revised: 10/31/2017 Document Reviewed: 10/31/2017 °Elsevier Patient Education © 2020 [employer] Inc[employer] ° °

## 2019-07-30 NOTE — Progress Notes (Signed)
[name] is a 44 y.o. female who presents to [facility] clinic today with no complaints.    Pap Smear: Pap not smear completed today. Last Pap smear was 12/20/2016 at [facility] clinic and was normal. Per patient has no history of an abnormal Pap smear. Last Pap smear result is available in Epic.   Physical exam: Breasts Breasts symmetrical. No skin abnormalities bilateral breasts. No nipple retraction bilateral breasts. No nipple discharge bilateral breasts. No lymphadenopathy. No lumps palpated bilateral breasts.       Pelvic/Bimanual Pap is not indicated today    Smoking History: Patient has is a former smoker.     Patient Navigation: Patient education provided. Access to services provided for patient through [facility] program.   Colorectal Cancer Screening: Per patient has never had colonoscopy completed No complaints today.    Breast and Cervical Cancer Risk Assessment: Patient does not have family history of breast cancer, known genetic mutations, or radiation treatment to the chest before age 70. Patient does not have history of cervical dysplasia, immunocompromised, or DES exposure in-utero.  Risk Assessment    Risk Scores      07/30/2019   Last edited by: Stefanie Revel, LPN   5-year risk: 0.7 %   Lifetime risk: 8.6 %          A: [facility]CP exam without pap smear  P: Referred patient to the [facility] for a screening mammogram. Appointment scheduled 07/30/19 @ 11:10 am.  Stefanie Redden, PA-C 07/30/2019 10:20 AM

## 2019-07-31 ENCOUNTER — Telehealth (INDEPENDENT_AMBULATORY_CARE_PROVIDER_SITE_OTHER): Payer: Self-pay | Admitting: Nurse Practitioner

## 2019-07-31 ENCOUNTER — Encounter: Payer: Self-pay | Admitting: Nurse Practitioner

## 2019-07-31 DIAGNOSIS — I10 Essential (primary) hypertension: Secondary | ICD-10-CM

## 2019-07-31 DIAGNOSIS — F321 Major depressive disorder, single episode, moderate: Secondary | ICD-10-CM

## 2019-07-31 DIAGNOSIS — Z638 Other specified problems related to primary support group: Secondary | ICD-10-CM

## 2019-07-31 DIAGNOSIS — E782 Mixed hyperlipidemia: Secondary | ICD-10-CM

## 2019-07-31 NOTE — Assessment & Plan Note (Addendum)
Chronic, ongoing with improvement in mood with current regimen.  Continue current medication regimen.  CCM referral placed to assist with therapy and resources.  Return in 3 months.

## 2019-07-31 NOTE — Patient Instructions (Signed)
Living With Depression Everyone experiences occasional disappointment, sadness, and loss in their lives. When you are feeling down, blue, or sad for at least 2 weeks in a row, it may mean that you have depression. Depression can affect your thoughts and feelings, relationships, daily activities, and physical health. It is caused by changes in the way your brain functions. If you receive a diagnosis of depression, your health care provider will tell you which type of depression you have and what treatment options are available to you. If you are living with depression, there are ways to help you recover from it and also ways to prevent it from coming back. How to cope with lifestyle changes Coping with stress     Stress is your body's reaction to life changes and events, both good and bad. Stressful situations may include:  Getting married.  The death of a spouse.  Losing a job.  Retiring.  Having a baby. Stress can last just a few hours or it can be ongoing. Stress can play a major role in depression, so it is important to learn both how to cope with stress and how to think about it differently. Talk with your health care provider or a counselor if you would like to learn more about stress reduction. He or she may suggest some stress reduction techniques, such as:  Music therapy. This can include creating music or listening to music. Choose music that you enjoy and that inspires you.  Mindfulness-based meditation. This kind of meditation can be done while sitting or walking. It involves being aware of your normal breaths, rather than trying to control your breathing.  Centering prayer. This is a kind of meditation that involves focusing on a spiritual word or phrase. Choose a word, phrase, or sacred image that is meaningful to you and that brings you peace.  Deep breathing. To do this, expand your stomach and inhale slowly through your nose. Hold your breath for 3-5 seconds, then exhale  slowly, allowing your stomach muscles to relax.  Muscle relaxation. This involves intentionally tensing muscles then relaxing them. Choose a stress reduction technique that fits your lifestyle and personality. Stress reduction techniques take time and practice to develop. Set aside 5-15 minutes a day to do them. Therapists can offer training in these techniques. The training may be covered by some insurance plans. Other things you can do to manage stress include:  Keeping a stress diary. This can help you learn what triggers your stress and ways to control your response.  Understanding what your limits are and saying no to requests or events that lead to a schedule that is too full.  Thinking about how you respond to certain situations. You may not be able to control everything, but you can control how you react.  Adding humor to your life by watching funny films or TV shows.  Making time for activities that help you relax and not feeling guilty about spending your time this way.  Medicines Your health care provider may suggest certain medicines if he or she feels that they will help improve your condition. Avoid using alcohol and other substances that may prevent your medicines from working properly (may interact). It is also important to:  Talk with your pharmacist or health care provider about all the medicines that you take, their possible side effects, and what medicines are safe to take together.  Make it your goal to take part in all treatment decisions (shared decision-making). This includes giving input on   the side effects of medicines. It is best if shared decision-making with your health care provider is part of your total treatment plan. If your health care provider prescribes a medicine, you may not notice the full benefits of it for 4-8 weeks. Most people who are treated for depression need to be on medicine for at least 6-12 months after they feel better. If you are taking  medicines as part of your treatment, do not stop taking medicines without first talking to your health care provider. You may need to have the medicine slowly decreased (tapered) over time to decrease the risk of harmful side effects. Relationships Your health care provider may suggest family therapy along with individual therapy and drug therapy. While there may not be family problems that are causing you to feel depressed, it is still important to make sure your family learns as much as they can about your mental health. Having your family's support can help make your treatment successful. How to recognize changes in your condition Everyone has a different response to treatment for depression. Recovery from major depression happens when you have not had signs of major depression for two months. This may mean that you will start to:  Have more interest in doing activities.  Feel less hopeless than you did 2 months ago.  Have more energy.  Overeat less often, or have better or improving appetite.  Have better concentration. Your health care provider will work with you to decide the next steps in your recovery. It is also important to recognize when your condition is getting worse. Watch for these signs:  Having fatigue or low energy.  Eating too much or too little.  Sleeping too much or too little.  Feeling restless, agitated, or hopeless.  Having trouble concentrating or making decisions.  Having unexplained physical complaints.  Feeling irritable, angry, or aggressive. Get help as soon as you or your family members notice these symptoms coming back. How to get support and help from others How to talk with friends and family members about your condition  Talking to friends and family members about your condition can provide you with one way to get support and guidance. Reach out to trusted friends or family members, explain your symptoms to them, and let them know that you are  working with a health care provider to treat your depression. Financial resources Not all insurance plans cover mental health care, so it is important to check with your insurance carrier. If paying for co-pays or counseling services is a problem, search for a local or county mental health care center. They may be able to offer public mental health care services at low or no cost when you are not able to see a private health care provider. If you are taking medicine for depression, you may be able to get the generic form, which may be less expensive. Some makers of prescription medicines also offer help to patients who cannot afford the medicines they need. Follow these instructions at home:   Get the right amount and quality of sleep.  Cut down on using caffeine, tobacco, alcohol, and other potentially harmful substances.  Try to exercise, such as walking or lifting small weights.  Take over-the-counter and prescription medicines only as told by your health care provider.  Eat a healthy diet that includes plenty of vegetables, fruits, whole grains, low-fat dairy products, and lean protein. Do not eat a lot of foods that are high in solid fats, added sugars, or salt.    Keep all follow-up visits as told by your health care provider. This is important. Contact a health care provider if:  You stop taking your antidepressant medicines, and you have any of these symptoms: ? Nausea. ? Headache. ? Feeling lightheaded. ? Chills and body aches. ? Not being able to sleep (insomnia).  You or your friends and family think your depression is getting worse. Get help right away if:  You have thoughts of hurting yourself or others. If you ever feel like you may hurt yourself or others, or have thoughts about taking your own life, get help right away. You can go to your nearest emergency department or call:  Your local emergency services (911 in the [place]  A suicide crisis helpline, such as the  National Suicide Prevention Lifeline at 1-800-273-8255. This is open 24-hours a day. Summary  If you are living with depression, there are ways to help you recover from it and also ways to prevent it from coming back.  Work with your health care team to create a management plan that includes counseling, stress management techniques, and healthy lifestyle habits. This information is not intended to replace advice given to you by your health care provider. Make sure you discuss any questions you have with your health care provider. Document Revised: 07/04/2018 Document Reviewed: 02/13/2016 Elsevier Patient Education  2020 [employer]  

## 2019-07-31 NOTE — Assessment & Plan Note (Signed)
Chronic, ongoing.  Continue current medication regimen and adjust as needed.  Denies SI/HI. Return to office in 3 months. 

## 2019-07-31 NOTE — Assessment & Plan Note (Signed)
Chronic, stable with BP at goal on recent visits.  At this time recommend DASH diet and focus on activity, such as walking, which would benefit mood and overall physical health + continue HCTZ.  Recommend checking BP at home at least a few mornings a week and documenting.  Return in 3 months.

## 2019-07-31 NOTE — Progress Notes (Signed)
There were no vitals taken for this visit.   Subjective:    Patient ID: Stefanie Stewart, female    DOB: Jun 27, 1975, 44 y.o.   MRN: [id number]  HPI: Stefanie Stewart is a 44 y.o. female  Chief Complaint  Patient presents with  . Depression  . Hyperlipidemia    . This visit was completed via telephone due to the restrictions of the COVID-19 pandemic. All issues as above were discussed and addressed but no physical exam was performed. If it was felt that the patient should be evaluated in the office, they were directed there. The patient verbally consented to this visit. Patient was unable to complete an audio/visual visit due to Lack of equipment. Due to the catastrophic nature of the COVID-19 pandemic, this visit was done through audio contact only. . Location of the patient: home . Location of the provider: work . Those involved with this call:  . Provider: Marnee Guarneri, DNP . CMA: Stefanie Stewart, CMA . Front Desk/Registration: Don Perking  . Time spent on call: 15 minutes on the phone discussing health concerns. 10 minutes total spent in review of patient's record and preparation of their chart.  . I verified patient identity using two factors (patient name and date of birth). Patient consents verbally to being seen via telemedicine visit today.   HYPERTENSION / HYPERLIPIDEMIA Continues on Crestor 10 MG -- having leg cramps with this three days a week, and HCTZ 25 MG daily.  Has tried multiple statins and continues to have ongoing leg cramps.   Satisfied with current treatment? yes Duration of hypertension: chronic BP monitoring frequency: not checking BP range:  BP medication side effects: no Duration of hyperlipidemia: chronic Cholesterol medication side effects: no Cholesterol supplements: none Medication compliance: fair compliance - at times forgets Aspirin: no Recent stressors: no Recurrent headaches: no Visual changes: no Palpitations: no Dyspnea:  no Chest pain: no Lower extremity edema: no Dizzy/lightheaded: no  The 10-year ASCVD risk score Mikey Bussing DC Jr., et al., 2013) is: 30.1%   Values used to calculate the score:     Age: 27 years     Sex: Female     Is Non-Hispanic African American: No     Diabetic: No     Tobacco smoker: Yes     Systolic Blood Pressure: [id number] mmHg     Is BP treated: Yes     HDL Cholesterol: 23 mg/dL     Total Cholesterol: 299 mg/dL  DEPRESSION Currently taking Zoloft 75 MG and Wellbutrin 150 MG XL daily. Has caregiver role strain. Pt reports significant caregiver stress from caring for her ailing mother with Alzheimer disease. Pt states her father passed away in the past year and she is the sole caregiver for her mother. Does have helpers come into home, but her brother does not help. Sold her house, which was major stressor.  She does endorse needing help -- discussed CCM SW and she interested in this referral.   Mood status: stable Satisfied with current treatment?: yes Symptom severity: mild  Duration of current treatment : chronic Side effects: no Medication compliance: fair compliance -- at times forgets Psychotherapy/counseling: none Depressed mood: yes Anxious mood: yes Anhedonia: no Significant weight loss or gain: no Insomnia: yes hard to fall asleep Fatigue: no Feelings of worthlessness or guilt: no Impaired concentration/indecisiveness: no Suicidal ideations: no Hopelessness: no Crying spells: no Depression screen [facility] 2/9 07/31/2019 05/18/2019 04/06/2019 04/06/2019 01/26/2019  Decreased Interest 1 1 0 0 1  Down,  Depressed, Hopeless 0 1 1 1 1   PHQ - 2 Score 1 2 1 1 2   Altered sleeping 2 1 0 - 1  Tired, decreased energy 0 1 0 - 0  Change in appetite 3 3 3  - 3  Feeling bad or failure about yourself  1 0 0 - 1  Trouble concentrating 0 0 3 - 0  Moving slowly or fidgety/restless 0 0 0 - 0  Suicidal thoughts 0 0 0 - 0  PHQ-9 Score 7 7 7  - 7  Difficult doing work/chores Somewhat difficult Not  difficult at all - - Somewhat difficult  Some recent data might be hidden    Relevant past medical, surgical, family and social history reviewed and updated as indicated. Interim medical history since our last visit reviewed. Allergies and medications reviewed and updated.  Review of Systems  Constitutional: Negative for activity change, appetite change, diaphoresis, fatigue and fever.  Respiratory: Negative for cough, chest tightness and shortness of breath.   Cardiovascular: Negative for chest pain, palpitations and leg swelling.  Gastrointestinal: Negative.   Endocrine: Negative for polyuria.  Neurological: Negative for dizziness, syncope, weakness, light-headedness, numbness and headaches.  Psychiatric/Behavioral: Negative.     Per HPI unless specifically indicated above     Objective:    There were no vitals taken for this visit.  Wt Readings from Last 3 Encounters:  07/30/19 256 lb (116.1 kg)  01/26/19 264 lb (119.7 kg)  05/29/18 276 lb 1.6 oz (125.2 kg)    Physical Exam   Unable to perform due to telephone visit only.  Results for orders placed or performed in visit on 01/26/19  Comprehensive metabolic panel  Result Value Ref Range   Glucose 294 (H) 65 - 99 mg/dL   BUN 12 6 - 24 mg/dL   Creatinine, Ser 0.75 0.57 - 1.00 mg/dL   GFR calc non Af Amer 97 >59 mL/min/1.73   GFR calc Af Amer 111 >59 mL/min/1.73   BUN/Creatinine Ratio 16 9 - 23   Sodium 139 134 - 144 mmol/L   Potassium 4.7 3.5 - 5.2 mmol/L   Chloride 102 96 - 106 mmol/L   CO2 21 20 - 29 mmol/L   Calcium 9.5 8.7 - 10.2 mg/dL   Total Protein 7.2 6.0 - 8.5 g/dL   Albumin 4.5 3.8 - 4.8 g/dL   Globulin, Total 2.7 1.5 - 4.5 g/dL   Albumin/Globulin Ratio 1.7 1.2 - 2.2   Bilirubin Total 0.6 0.0 - 1.2 mg/dL   Alkaline Phosphatase 83 39 - 117 IU/L   AST 51 (H) 0 - 40 IU/L   ALT 69 (H) 0 - 32 IU/L  Lipid Panel w/o Chol/HDL Ratio  Result Value Ref Range   Cholesterol, Total 299 (H) 100 - 199 mg/dL    Triglycerides 509 (H) 0 - 149 mg/dL   HDL 23 (L) >39 mg/dL   VLDL Cholesterol Cal 103 (H) 5 - 40 mg/dL   LDL Chol Calc (NIH) 173 (H) 0 - 99 mg/dL      Assessment & Plan:   Problem List Items Addressed This Visit      Cardiovascular and Mediastinum   Hypertension    Chronic, stable with BP at goal on recent visits.  At this time recommend DASH diet and focus on activity, such as walking, which would benefit mood and overall physical health + continue HCTZ.  Recommend checking BP at home at least a few mornings a week and documenting.  Return in 3 months.  Other   Caregiver role strain    Chronic, ongoing with improvement in mood with current regimen.  Continue current medication regimen.  CCM referral placed to assist with therapy and resources.  Return in 3 months.        Relevant Orders   Referral to Chronic Care Management Services   Depression - Primary    Chronic, ongoing.  Continue current medication regimen and adjust as needed.  Denies SI/HI.  Return to office in 3 months.      Relevant Orders   Referral to Chronic Care Management Services   Mixed hyperlipidemia    Chronic, ongoing with no current statin.  Not tolerating statin.  Could consider PSK9, although is self pay and may need CCM assist with this.  Continue diet focus at this time.  Return in 3 months for labs and visit.         I discussed the assessment and treatment plan with the patient. The patient was provided an opportunity to ask questions and all were answered. The patient agreed with the plan and demonstrated an understanding of the instructions.   The patient was advised to call back or seek an in-person evaluation if the symptoms worsen or if the condition fails to improve as anticipated.   I provided 15+ minutes of time during this encounter.  Follow up plan: Return in about 3 months (around 10/31/2019) for Depression, HTN/HLD, weight.

## 2019-07-31 NOTE — Assessment & Plan Note (Signed)
Chronic, ongoing with no current statin.  Not tolerating statin.  Could consider PSK9, although is self pay and may need CCM assist with this.  Continue diet focus at this time.  Return in 3 months for labs and visit.

## 2019-08-03 ENCOUNTER — Telehealth: Payer: Self-pay | Admitting: Nurse Practitioner

## 2019-08-03 NOTE — Chronic Care Management (AMB) (Signed)
  Care Management   Note  08/03/2019 Name: Stefanie Stewart MRN: [id number] DOB: 1975-12-28  Stefanie Stewart is a 44 y.o. year old female who is a primary care patient of [name], [name], NP. I reached out to Stefanie Stewart by phone today in response to a referral sent by Stefanie Stewart's health plan.    Stefanie Stewart was given information about care management services today including:  1. Care management services include personalized support from designated clinical staff supervised by her physician, including individualized plan of care and coordination with other care providers 2. 24/7 contact phone numbers for assistance for urgent and routine care needs. 3. The patient may stop care management services at any time by phone call to the office staff.  Patient agreed to services and verbal consent obtained.   Follow up plan: Telephone appointment with care management team member scheduled for:08/14/2019  Stefanie Durand, Stefanie Stewart Health Advisor, [facility] ??[email or web address] ??[phone]

## 2019-08-04 ENCOUNTER — Ambulatory Visit: Payer: Self-pay | Admitting: General Practice

## 2019-08-04 ENCOUNTER — Other Ambulatory Visit: Payer: Self-pay | Admitting: Nurse Practitioner

## 2019-08-04 ENCOUNTER — Ambulatory Visit: Payer: Self-pay | Admitting: Pharmacist

## 2019-08-04 DIAGNOSIS — E782 Mixed hyperlipidemia: Secondary | ICD-10-CM

## 2019-08-04 DIAGNOSIS — F321 Major depressive disorder, single episode, moderate: Secondary | ICD-10-CM

## 2019-08-04 DIAGNOSIS — I10 Essential (primary) hypertension: Secondary | ICD-10-CM

## 2019-08-04 DIAGNOSIS — Z638 Other specified problems related to primary support group: Secondary | ICD-10-CM

## 2019-08-04 DIAGNOSIS — N809 Endometriosis, unspecified: Secondary | ICD-10-CM

## 2019-08-04 MED ORDER — SERTRALINE HCL 50 MG PO TABS: 75.0000 mg | ORAL_TABLET | Freq: Every day | ORAL | 4 refills | Status: DC

## 2019-08-04 MED ORDER — BUPROPION HCL ER (XL) 150 MG PO TB24: 150.0000 mg | ORAL_TABLET | Freq: Every day | ORAL | 4 refills | Status: DC

## 2019-08-04 MED ORDER — HYDROCHLOROTHIAZIDE 25 MG PO TABS: 25.0000 mg | ORAL_TABLET | Freq: Every day | ORAL | 4 refills | Status: DC | PRN

## 2019-08-04 MED ORDER — NORETHINDRONE ACETATE 5 MG PO TABS: 10.0000 mg | ORAL_TABLET | Freq: Every day | ORAL | 4 refills | Status: DC

## 2019-08-04 MED ORDER — REPATHA SURECLICK 140 MG/ML ~~LOC~~ SOAJ: 140.0000 mg | SUBCUTANEOUS | 6 refills | Status: DC

## 2019-08-04 NOTE — Progress Notes (Signed)
Repatha initiation due to ongoing side effects with statins.

## 2019-08-04 NOTE — Patient Instructions (Signed)
Visit Information  Goals Addressed            This Visit's Progress     Patient Stated   . PharmD "I can't afford my medications" (pt-stated)       CARE PLAN ENTRY (see longtitudinal plan of care for additional care plan information)  Current Barriers:  . Polypharmacy; complex patient with multiple comorbidities including depression/anxiety, HLD, caregiver strain, endometriosis . Financial concerns: patient is uninsured . Caregiver strain: patient is the primary caregiver for her mother, reports she has put on ~50 lbs since her father passed away and she assumed this role. Indicates little motivation to care for herself, feels like she is burned out for caring for her mother, her pets o Depression/anxiety: sertraline 75 mg daily, bupropion XL 150 mg daily. Denies ever having been on higher doses of sertraline.  o HLD: hx statin intolerance. Baseline LDL 175, is not tolerating rosuvastatin 10 mg three days weekly, reports muscle cramps/pains. Also hx pravastatin. Unknown family hx, as she is adopted. 10 year ASCVD risk 30% o Hormone therapy: norethindrone 10 mg daily; reports that 5 mg daily was tried, but still had breakthrough bleeding o Hx LEE: notes she has not needed to use HCTZ PRN edema in months  Pharmacist Clinical Goal(s):  [name] Over the next 90 days, patient will work with PharmD and provider towards optimized medication management  Interventions: . Comprehensive medication review performed; medication list updated in electronic medical record . Inter-disciplinary care team collaboration (see longitudinal plan of care) . Reviewed [facility]. Will collaborate w/ PCP to send chronic prescriptions (bupropion, sertraline, norethindrone) to [facility]. Will message Alm Bustard, PharmD, to ask to reach out to discuss eligibility . Discussed risk of ASCVD given significantly elevated LDL. Intolerance to multiple statins. Ezetimibe unlikely to get to goal LDL, given baseline  LDL 175. Discussed PCSK9. Patient amenable. Can send prescription for Repatha to [facility], and they can help with patient assistance program application. Will collaborate w/ PCP to see if she would like to start this now, or defer until next f/u (due in August).  . Given decreased motivation, energy, could consider increasing sertraline dose to 100 mg daily.   Patient Self Care Activities:  . Patient will take medications as prescribed  Initial goal documentation        Patient verbalizes understanding of instructions provided today.    Plan: - Will check back next week to ensure patient has connected w/ [place], PharmD, [place] [phone]

## 2019-08-04 NOTE — Patient Instructions (Signed)
Visit Information  Goals Addressed            This Visit's Progress   . RNCM: Chronic Disease Management- HTN/HLD       CARE PLAN ENTRY (see longtitudinal plan of care for additional care plan information)  Current Barriers:  . Chronic Disease Management support, education, and care coordination needs related to HTN and HLD  Clinical Goal(s) related to HTN and HLD:  Over the next 120 days, patient will:  . Work with the care management team to address educational, disease management, and care coordination needs  . Begin or continue self health monitoring activities as directed today Measure and record blood pressure 3 times per week and adhere to heart healthy diet . Call provider office for new or worsened signs and symptoms Blood pressure findings outside established parameters and New or worsened symptom related to HLD and other chronic conditions . Call care management team with questions or concerns . Verbalize basic understanding of patient centered plan of care established today  Interventions related to HTN and HLD:  . Evaluation of current treatment plans and patient's adherence to plan as established by provider . Assessed patient understanding of disease states . Assessed patient's education and care coordination needs . Provided disease specific education to patient  . Collaborated with appropriate clinical care team members regarding patient needs  Patient Self Care Activities related to HTN and HLD:  . Patient is unable to independently self-manage chronic health conditions  Initial goal documentation     . RNCM: Pt-"I could use all the support I can get" (pt-stated)       CARE PLAN ENTRY (see longitudinal plan of care for additional care plan information)  Current Barriers:  [name] Knowledge Deficits related to resources available to help her manage her health and well being as evidence of caregiver role strain as she is the primary caregiver of her elderly  mother . Care Coordination needs related to resources in the community in a patient with depression and caregiver role strain (disease states) . Chronic Disease Management support and education needs related to depression . Lacks caregiver support. Has a part time paid caregiver but for the most part the patient is with her elderly mother 24/7 . Film/video editor.  . Difficulty obtaining medications- financial concerns due to not having insurance . Limited counseling options for the patient due to [place] . Unemployment due to losing her job in the [place] pandemic and having to sell her home and move in to take care of her elderly mother  Nurse Case Manager Clinical Goal(s):  [name] Over the next 120 days, patient will verbalize understanding of plan for working with the CCM team and pcp to help the patient with managing her depression, stress, and other chronic conditions due to being the primary caregiver of her elderly mother.  . Over the next 120 days, patient will work with CCM team, RNCM, and pcp to address needs related to depression, stress, and caregiver role strain . Over the next 120 days, patient will work with CM team pharmacist to assist with medication cost and resources to help with cost constraints of medication due to not having insurance.  . Over the next 120 days, patient will work with CM clinical social worker to help with managing depression, stress, and caregiver role strain by implementation of effective coping mechanisms and counseling options . Over the next 120 days, patient will work with Care guides  (community agency) to assist with resources in the  community to help the patient meet her needs as well as the needs of her elderly mother who she cares for . Over the next 120 days, the patient will demonstrate ongoing self health care management ability as evidenced by improved health and well being and no exacerbations with depression and anxiety in relation to caregiver  role strain  Interventions:  . Inter-disciplinary care team collaboration (see longitudinal plan of care) . Evaluation of current treatment plan related to depression and anxiety and patient's adherence to plan as established by provider. . Advised patient to check with her mothers provider for the resource of Right at Home: [phone] to see if this may be an option for help with her mother and help with the caregiver role strain she is experiencing  . Provided education to patient re: CCM team involvement and help with resources in the community . Collaborated with CCM team and pcp regarding patient needs and how to best help the patient with her depression and caregiver role strain . Provided patient and/or caregiver with care guide information about resources in the community to help with her health and wellness needs (community resource). . Provided patient with coping and other  educational materials related to depression and taking care of self through the my Chart system. . Reviewed scheduled/upcoming provider appointments including: CCM team appointments. Will discuss with the LCSW if the patient can be scheduled sooner. The pharmacist will reach out this afternoon to the patient for help with medication cost constraints . Care Guide referral for resources in the community to help with the expressed needs of the patient . Social Work referral for help with coping mechanisms and other resources to help with caregiver role strain, depression and stress . Pharmacy referral for help with medication cost due to the patient not having insurance  Patient Self Care Activities:  . Patient verbalizes understanding of plan to work with the CCM team and pcp to meet her health and wellness needs and improve depressed state . Self administers medications as prescribed . Attends all scheduled provider appointments . Calls provider office for new concerns or questions . Unable to independently manage  depression, stress, and anxiety due to caregiver role strain  Initial goal documentation        Stefanie Stewart was given information about Care Management services today including:  1. Care Management services include personalized support from designated clinical staff supervised by her physician, including individualized plan of care and coordination with other care providers 2. 24/7 contact phone numbers for assistance for urgent and routine care needs. 3. The patient may stop CCM services at any time (effective at the end of the month) by phone call to the office staff.  Patient agreed to services and verbal consent obtained.   Patient verbalizes understanding of instructions provided today.   The care management team will reach out to the patient again over the next 30 days.   Noreene Larsson RN, MSN, [facility] Mobile: [phone]

## 2019-08-04 NOTE — Chronic Care Management (AMB) (Signed)
Care Management   Initial Visit Note  08/04/2019 Name: Stefanie Stewart MRN: [id number] DOB: October 22, 1975  Subjective:   Objective:  BP Readings from Last 3 Encounters:  07/30/19 (!) 140/94  05/18/19 129/81  04/06/19 (!) 152/96    Assessment: Stefanie Stewart is a 44 y.o. year old female who sees [place], Henrine Screws T, NP for primary care. The care management team was consulted for assistance with care management and care coordination needs related to Disease Management Educational Needs Care Coordination Psychosocial Support Caregiver Stress support.   Review of patient status, including review of consultants reports, relevant laboratory and other test results, and collaboration with appropriate care team members and the patient's provider was performed as part of comprehensive patient evaluation and provision of care management services.    SDOH (Social Determinants of Health) assessments performed: Yes See Care Plan activities for detailed interventions related to SDOH)  SDOH Interventions     Most Recent Value  SDOH Interventions  SDOH Interventions for the Following Domains  Stress  Stress Interventions  Provide Counseling, Other (Comment) [is the primary caregiver of her elderly mother, causes a lot of stress and anxiety]  Depression Interventions/Treatment   Medication [Primary caregiver of elderly mother, has a lot of depression and stress]       Outpatient Encounter Medications as of 08/04/2019  Medication Sig Note  . buPROPion (WELLBUTRIN XL) 150 MG 24 hr tablet Take 1 tablet (150 mg total) by mouth daily.   . calcium-vitamin D (OSCAL WITH D) 500-200 MG-UNIT tablet Take 2 tablets by mouth.   . hydrochlorothiazide (HYDRODIURIL) 25 MG tablet Take 1 tablet (25 mg total) by mouth daily as needed. 05/06/2018: As needed   . Melatonin 10 MG TABS Take 1 tablet by mouth at bedtime as needed.   . methocarbamol (ROBAXIN) 500 MG tablet Take 1 tablet (500 mg total) by mouth every 8 (eight)  hours as needed for muscle spasms.   . norethindrone (AYGESTIN) 5 MG tablet Take 2 tablets (10 mg total) by mouth daily.   [name] omega-3 acid ethyl esters (LOVAZA) 1 g capsule Take 1,000 mg by mouth daily.   . rosuvastatin (CRESTOR) 10 MG tablet Take one tablet at bedtime by mouth on Monday, Wednesday, and Friday.   . sertraline (ZOLOFT) 50 MG tablet Take 1.5 tablets (75 mg total) by mouth daily.   . Turmeric 500 MG CAPS Take 1 capsule by mouth daily.    No facility-administered encounter medications on file as of 08/04/2019.    Goals Addressed            This Visit's Progress   . RNCM: Chronic Disease Management- HTN/HLD       CARE PLAN ENTRY (see longtitudinal plan of care for additional care plan information)  Current Barriers:  . Chronic Disease Management support, education, and care coordination needs related to HTN and HLD  Clinical Goal(s) related to HTN and HLD:  Over the next 120 days, patient will:  . Work with the care management team to address educational, disease management, and care coordination needs  . Begin or continue self health monitoring activities as directed today Measure and record blood pressure 3 times per week and adhere to heart healthy diet . Call provider office for new or worsened signs and symptoms Blood pressure findings outside established parameters and New or worsened symptom related to HLD and other chronic conditions . Call care management team with questions or concerns . Verbalize basic understanding of patient centered plan of  care established today  Interventions related to HTN and HLD:  . Evaluation of current treatment plans and patient's adherence to plan as established by provider . Assessed patient understanding of disease states . Assessed patient's education and care coordination needs . Provided disease specific education to patient  . Collaborated with appropriate clinical care team members regarding patient needs  Patient Self  Care Activities related to HTN and HLD:  . Patient is unable to independently self-manage chronic health conditions  Initial goal documentation     . RNCM: Pt-"I could use all the support I can get" (pt-stated)       CARE PLAN ENTRY (see longitudinal plan of care for additional care plan information)  Current Barriers:  [name] Knowledge Deficits related to resources available to help her manage her health and well being as evidence of caregiver role strain as she is the primary caregiver of her elderly mother . Care Coordination needs related to resources in the community in a patient with depression and caregiver role strain (disease states) . Chronic Disease Management support and education needs related to depression . Lacks caregiver support. Has a part time paid caregiver but for the most part the patient is with her elderly mother 24/7 . Film/video editor.  . Difficulty obtaining medications- financial concerns due to not having insurance . Limited counseling options for the patient due to [place] . Unemployment due to losing her job in the [place] pandemic and having to sell her home and move in to take care of her elderly mother  Nurse Case Manager Clinical Goal(s):  [name] Over the next 120 days, patient will verbalize understanding of plan for working with the CCM team and pcp to help the patient with managing her depression, stress, and other chronic conditions due to being the primary caregiver of her elderly mother.  . Over the next 120 days, patient will work with CCM team, RNCM, and pcp to address needs related to depression, stress, and caregiver role strain . Over the next 120 days, patient will work with CM team pharmacist to assist with medication cost and resources to help with cost constraints of medication due to not having insurance.  . Over the next 120 days, patient will work with CM clinical social worker to help with managing depression, stress, and caregiver role strain  by implementation of effective coping mechanisms and counseling options . Over the next 120 days, patient will work with Care guides  (community agency) to assist with resources in the community to help the patient meet her needs as well as the needs of her elderly mother who she cares for . Over the next 120 days, the patient will demonstrate ongoing self health care management ability as evidenced by improved health and well being and no exacerbations with depression and anxiety in relation to caregiver role strain  Interventions:  . Inter-disciplinary care team collaboration (see longitudinal plan of care) . Evaluation of current treatment plan related to depression and anxiety and patient's adherence to plan as established by provider. . Advised patient to check with her mothers provider for the resource of Right at Home: [phone] to see if this may be an option for help with her mother and help with the caregiver role strain she is experiencing  . Provided education to patient re: CCM team involvement and help with resources in the community . Collaborated with CCM team and pcp regarding patient needs and how to best help the patient with her depression and caregiver role  strain . Provided patient and/or caregiver with care guide information about resources in the community to help with her health and wellness needs (community resource). . Provided patient with coping and other  educational materials related to depression and taking care of self through the my Chart system. . Reviewed scheduled/upcoming provider appointments including: CCM team appointments. Will discuss with the LCSW if the patient can be scheduled sooner. The pharmacist will reach out this afternoon to the patient for help with medication cost constraints . Care Guide referral for resources in the community to help with the expressed needs of the patient . Social Work referral for help with coping mechanisms and other  resources to help with caregiver role strain, depression and stress . Pharmacy referral for help with medication cost due to the patient not having insurance  Patient Self Care Activities:  . Patient verbalizes understanding of plan to work with the CCM team and pcp to meet her health and wellness needs and improve depressed state . Self administers medications as prescribed . Attends all scheduled provider appointments . Calls provider office for new concerns or questions . Unable to independently manage depression, stress, and anxiety due to caregiver role strain  Initial goal documentation         Follow up plan:  The care management team will reach out to the patient again over the next 30  days.   Ms. Keelan was given information about Care Management services today including:  1. Care Management services include personalized support from designated clinical staff supervised by a physician, including individualized plan of care and coordination with other care providers 2. 24/7 contact phone numbers for assistance for urgent and routine care needs. 3. The patient may stop Care Management services at any time (effective at the end of the month) by phone call to the office staff.  Patient agreed to services and verbal consent obtained.  Noreene Larsson RN, MSN, [facility] Mobile: [phone]

## 2019-08-04 NOTE — Chronic Care Management (AMB) (Signed)
Chronic Care Management   Note  08/04/2019 Name: Stefanie Stewart MRN: [id number] DOB: 04-Aug-1975   Subjective:  Stefanie Stewart is a 44 y.o. year old female who is a primary care patient of [name], Stefanie Faster, NP. The CCM team was consulted for assistance with chronic disease management and care coordination needs.    Contacted patient for urgent medication access need.   Review of patient status, including review of consultants reports, laboratory and other test data, was performed as part of comprehensive evaluation and provision of chronic care management services.   SDOH (Social Determinants of Health) assessments and interventions performed:  yes  Objective:  Lab Results  Component Value Date   CREATININE 0.75 01/26/2019   CREATININE 0.76 08/09/2017   CREATININE 0.67 07/22/2015    No results found for: HGBA1C     Component Value Date/Time   CHOL 299 (H) 01/26/2019 1041   TRIG 509 (H) 01/26/2019 1041   HDL 23 (L) 01/26/2019 1041   LDLCALC 173 (H) 01/26/2019 1041    Clinical ASCVD: No  The 10-year ASCVD risk score Mikey Bussing DC Jr., et al., 2013) is: 30.1%   Values used to calculate the score:     Age: 40 years     Sex: Female     Is Non-Hispanic African American: No     Diabetic: No     Tobacco smoker: Yes     Systolic Blood Pressure: [id number] mmHg     Is BP treated: Yes     HDL Cholesterol: 23 mg/dL     Total Cholesterol: 299 mg/dL    BP Readings from Last 3 Encounters:  07/30/19 (!) 140/94  05/18/19 129/81  04/06/19 (!) 152/96    Allergies  Allergen Reactions  . Banana Other (See Comments)    Every now and then pt states she has nausea and vomiting     Medications Reviewed Today    Reviewed by De Hollingshead, [facility] (Pharmacist) on 08/04/19 at 1238  Med List Status: <None>  Medication Order Taking? Sig Documenting Provider Last Dose Status Informant  buPROPion (WELLBUTRIN XL) 150 MG 24 hr tablet [id number] Yes Take 1 tablet (150 mg total) by mouth daily.  Marnee Guarneri T, NP Taking Active   cholecalciferol (VITAMIN D3) 25 MCG (1000 UNIT) tablet [id number] Yes Take 1,000 Units by mouth daily. [provider] Taking Active   hydrochlorothiazide (HYDRODIURIL) 25 MG tablet [id number] No Take 1 tablet (25 mg total) by mouth daily as needed.  Patient not taking: Reported on 08/04/2019   Kathrine Haddock, NP Not Taking Active            Med Note Lesle Chris   Tue May 06, 2018  8:38 AM) As needed   Melatonin 10 MG TABS [id number]  Take 1 tablet by mouth at bedtime as needed. [provider]  Active   methocarbamol (ROBAXIN) 500 MG tablet [id number] No Take 1 tablet (500 mg total) by mouth every 8 (eight) hours as needed for muscle spasms.  Patient not taking: Reported on 08/04/2019   Marnee Guarneri T, NP Not Taking Active   norethindrone (AYGESTIN) 5 MG tablet [id number] Yes Take 2 tablets (10 mg total) by mouth daily. Marnee Guarneri T, NP Taking Active   rosuvastatin (CRESTOR) 10 MG tablet [id number] No Take one tablet at bedtime by mouth on Monday, Wednesday, and Friday.  Patient not taking: Reported on 08/04/2019   Marnee Guarneri T, NP Not Taking Active   sertraline (ZOLOFT) 50 MG tablet [id number]  Yes Take 1.5 tablets (75 mg total) by mouth daily. Marnee Guarneri T, NP Taking Active         Discontinued 08/04/19 1238 (Completed Course)            Assessment:   Goals Addressed            This Visit's Progress     Patient Stated   . PharmD "I can't afford my medications" (pt-stated)       CARE PLAN ENTRY (see longtitudinal plan of care for additional care plan information)  Current Barriers:  . Polypharmacy; complex patient with multiple comorbidities including depression/anxiety, HLD, caregiver strain, endometriosis . Financial concerns: patient is uninsured . Caregiver strain: patient is the primary caregiver for her mother, reports she has put on ~50 lbs since her father passed away and she assumed this role.  Indicates little motivation to care for herself, feels like she is burned out for caring for her mother, her pets o Depression/anxiety: sertraline 75 mg daily, bupropion XL 150 mg daily. Denies ever having been on higher doses of sertraline.  o HLD: hx statin intolerance. Baseline LDL 175, is not tolerating rosuvastatin 10 mg three days weekly, reports muscle cramps/pains. Also hx pravastatin. Unknown family hx, as she is adopted. 10 year ASCVD risk 30% o Hormone therapy: norethindrone 10 mg daily; reports that 5 mg daily was tried, but still had breakthrough bleeding o Hx LEE: notes she has not needed to use HCTZ PRN edema in months  Pharmacist Clinical Goal(s):  [name] Over the next 90 days, patient will work with PharmD and provider towards optimized medication management  Interventions: . Comprehensive medication review performed; medication list updated in electronic medical record . Inter-disciplinary care team collaboration (see longitudinal plan of care) . Reviewed [facility]. Will collaborate w/ PCP to send chronic prescriptions (bupropion, sertraline, norethindrone) to [facility]. Will message Alm Bustard, PharmD, to ask to reach out to discuss eligibility . Discussed risk of ASCVD given significantly elevated LDL. Intolerance to multiple statins. Ezetimibe unlikely to get to goal LDL, given baseline LDL 175. Discussed PCSK9. Patient amenable. Can send prescription for Repatha to [facility], and they can help with patient assistance program application. Will collaborate w/ PCP to see if she would like to start this now, or defer until next f/u (due in August).  . Given decreased motivation, energy, could consider increasing sertraline dose to 100 mg daily.   Patient Self Care Activities:  . Patient will take medications as prescribed  Initial goal documentation        Plan: - Will check back next week to ensure patient has connected w/ [place], PharmD, [place] [phone]

## 2019-08-12 ENCOUNTER — Ambulatory Visit: Payer: Self-pay | Admitting: Pharmacist

## 2019-08-12 DIAGNOSIS — E782 Mixed hyperlipidemia: Secondary | ICD-10-CM

## 2019-08-12 NOTE — Chronic Care Management (AMB) (Signed)
Chronic Care Management   Follow Up Note   08/12/2019 Name: Stefanie Stewart MRN: [id number] DOB: October 29, 1975  Referred by: [name], NP Reason for referral : Chronic Care Management (Medication Management)   Stefanie Stewart is a 44 y.o. year old female who is a primary care patient of [name], [name], NP. The CCM team was consulted for assistance with chronic disease management and care coordination needs.    Contacted patient for medication management review.   Review of patient status, including review of consultants reports, relevant laboratory and other test results, and collaboration with appropriate care team members and the patient's provider was performed as part of comprehensive patient evaluation and provision of chronic care management services.    SDOH (Social Determinants of Health) assessments performed: Yes See Care Plan activities for detailed interventions related to [facility])     Outpatient Encounter Medications as of 08/12/2019  Medication Sig  . buPROPion (WELLBUTRIN XL) 150 MG 24 hr tablet Take 1 tablet (150 mg total) by mouth daily.  . cholecalciferol (VITAMIN D3) 25 MCG (1000 UNIT) tablet Take 1,000 Units by mouth daily.  . Evolocumab (REPATHA SURECLICK) [id number] MG/ML SOAJ Inject 140 mg into the skin every 14 (fourteen) days.  . hydrochlorothiazide (HYDRODIURIL) 25 MG tablet Take 1 tablet (25 mg total) by mouth daily as needed.  . Melatonin 10 MG TABS Take 1 tablet by mouth at bedtime as needed.  . methocarbamol (ROBAXIN) 500 MG tablet Take 1 tablet (500 mg total) by mouth every 8 (eight) hours as needed for muscle spasms. (Patient not taking: Reported on 08/04/2019)  . norethindrone (AYGESTIN) 5 MG tablet Take 2 tablets (10 mg total) by mouth daily.  . sertraline (ZOLOFT) 50 MG tablet Take 1.5 tablets (75 mg total) by mouth daily.   No facility-administered encounter medications on file as of 08/12/2019.     Objective:   Goals Addressed            This  Visit's Progress     Patient Stated   . PharmD "I can't afford my medications" (pt-stated)       CARE PLAN ENTRY (see longtitudinal plan of care for additional care plan information)  Current Barriers:  . Polypharmacy; complex patient with multiple comorbidities including depression/anxiety, HLD, caregiver strain, endometriosis o Contacted patient today to follow up on medication access from [facility].  [name] Financial concerns: patient is uninsured . Caregiver strain: patient is the primary caregiver for her mother, reports she has put on ~50 lbs since her father passed away and she assumed this role.  o Depression/anxiety: sertraline 75 mg daily, bupropion XL 150 mg daily. Denies ever having been on higher doses of sertraline.  o HLD: sent order for Repatha 140 mg Q2week;  - Hx statin intolerance. Baseline LDL 175, is not tolerating rosuvastatin 10 mg three days weekly, reports muscle cramps/pains. Also hx pravastatin. Unknown family hx, as she is adopted. 10 year ASCVD risk 30% o Hormone therapy: norethindrone 10 mg daily; reports that 5 mg daily was tried, but still had breakthrough bleeding o Hx LEE: notes she has not needed to use HCTZ PRN edema in months  Pharmacist Clinical Goal(s):  [name] Over the next 90 days, patient will work with PharmD and provider towards optimized medication management  Interventions: . Patient has not heard anything from [facility] yet, though eligibility pharmacist has been on vacation recently. Provided phone number, address. Patient is going to call to see if any medications can be  filled now, and pick up eligibility packet.  . Reviewed Repatha. Will likely require application to [employer] patient assistance before she can acquire it, so it may still be a few weeks until she starts the medication. Will collaborate w/ office staff to schedule f/u once patient gets medication.   Patient Self Care Activities:  . Patient will take medications as prescribed   Please see past updates related to this goal by clicking on the "Past Updates" button in the selected goal          Plan:  - Will continue to collaborate as above  Catie Darnelle Maffucci, PharmD, [place] [phone]

## 2019-08-12 NOTE — Chronic Care Management (AMB) (Signed)
  Chronic Care Management   Note  08/12/2019 Name: Stefanie Stewart MRN: [id number] DOB: Jun 09, 1975  [name] is a 44 y.o. year old female who is a primary care patient of [name], [name], NP. The CCM team was consulted for assistance with chronic disease management and care coordination needs.    Patient confirms she picked up all medications except Repatha from [facility]. She is completing the eligibility packet and will return to them; at that time, they will complete and submit application to [place] for [place] assistance. I have asked patient to call me when this happens, and we will collaborate to schedule PCP visit ~4-6 weeks later  Courtney Heys, PharmD, [place] [phone]

## 2019-08-12 NOTE — Patient Instructions (Signed)
Visit Information  Goals Addressed            This Visit's Progress     Patient Stated   . PharmD "I can't afford my medications" (pt-stated)       CARE PLAN ENTRY (see longtitudinal plan of care for additional care plan information)  Current Barriers:  . Polypharmacy; complex patient with multiple comorbidities including depression/anxiety, HLD, caregiver strain, endometriosis o Contacted patient today to follow up on medication access from [facility].  [name] Financial concerns: patient is uninsured . Caregiver strain: patient is the primary caregiver for her mother, reports she has put on ~50 lbs since her father passed away and she assumed this role.  o Depression/anxiety: sertraline 75 mg daily, bupropion XL 150 mg daily. Denies ever having been on higher doses of sertraline.  o HLD: sent order for Repatha 140 mg Q2week;  - Hx statin intolerance. Baseline LDL 175, is not tolerating rosuvastatin 10 mg three days weekly, reports muscle cramps/pains. Also hx pravastatin. Unknown family hx, as she is adopted. 10 year ASCVD risk 30% o Hormone therapy: norethindrone 10 mg daily; reports that 5 mg daily was tried, but still had breakthrough bleeding o Hx LEE: notes she has not needed to use HCTZ PRN edema in months  Pharmacist Clinical Goal(s):  [name] Over the next 90 days, patient will work with PharmD and provider towards optimized medication management  Interventions: . Patient has not heard anything from [facility] yet, though eligibility pharmacist has been on vacation recently. Provided phone number, address. Patient is going to call to see if any medications can be filled now, and pick up eligibility packet.  . Reviewed Repatha. Will likely require application to [employer] patient assistance before she can acquire it, so it may still be a few weeks until she starts the medication. Will collaborate w/ office staff to schedule f/u once patient gets medication.   Patient Self Care  Activities:  . Patient will take medications as prescribed  Please see past updates related to this goal by clicking on the "Past Updates" button in the selected goal         Patient verbalizes understanding of instructions provided today.   Plan:  - Will continue to collaborate as above  Catie Darnelle Maffucci, PharmD, [place] [phone]

## 2019-08-14 ENCOUNTER — Ambulatory Visit: Payer: Self-pay | Admitting: Licensed Clinical Social Worker

## 2019-08-14 ENCOUNTER — Telehealth: Payer: Self-pay

## 2019-08-14 NOTE — Chronic Care Management (AMB) (Signed)
Care Management   Follow Up Note   08/14/2019 Name: Stefanie Stewart MRN: [id number] DOB: [birth year]  Referred by: [name], NP Reason for referral : Care Coordination   Stefanie Stewart is a 44 y.o. year old female who is a primary care patient of [name], [name], NP. The care management team was consulted for assistance with care management and care coordination needs.    Review of patient status, including review of consultants reports, relevant laboratory and other test results, and collaboration with appropriate care team members and the patient's provider was performed as part of comprehensive patient evaluation and provision of chronic care management services.    SDOH (Social Determinants of Health) assessments performed: Yes See Care Plan activities for detailed interventions related to [facility])     Advanced Directives: See Care Plan and Vynca application for related entries.   Goals Addressed    . SW: "I really need someone to talk to" (pt-stated)       Current Barriers:  . Chronic Mental Health needs related to depression and caregiver strain . Limited social support . ADL IADL limitations . Mental Health Concerns  . Social Isolation . Limited access to caregiver . Suicidal Ideation/Homicidal Ideation: No  Clinical Social Work Goal(s):  [name] Over the next 120 days, patient will work with SW  bi-monthly  by telephone or in person to reduce or manage symptoms related to depression and caregiver stress . Over the next 120 days, patient will follow up with available mental health providers* as directed by SW (list emailed successfully on 08/14/19)  Interventions: . Patient interviewed and appropriate assessments performed: brief mental health assessment . Patient interviewed and appropriate assessments performed . Provided patient with information about available mental health resources and support group within the area . Discussed plans with patient for ongoing care  management follow up and provided patient with direct contact information for care management team . Advised patient to contact [facility] to ask about their walk in clinic services  . Collaborated with *C3 regarding caregiver stress and referral placed on 08/04/19 by CCM RNCM . Patient was made aware that she cannot be the paid caregiver for her mother unless her mother were to gain Medicaid. Patient reports that they are ineligible for Medicaid.  . Assisted patient/caregiver with obtaining information about health plan benefits . Provided education and assistance to client regarding Advanced Directives. . Referred patient to [facility] and [place]  for long term follow up and therapy/counseling. Email also sent with these resources which include the following: [name] [facility]   Walk-in Mon-Fri, 8:30-5:00 (No walk-in clinic due to [place]   . [address] [place], [place] [address]    [phone] [email or web address]  . To schedule an appointment call [phone] . [place] Mon-Fri, 8am-3pm [email or web address], [place], [place] [phone] . [place]   Walk-in-Clinic: Monday- Friday 9:00 AM - 4:00 PM . [place], [place] [phone] . [facility] [place] ([employer]) walk in M-F 8am-12pm and  1pm-3pm . [place]- [address]     [phone]  . [place] -[address]  Phone: [phone] . [place] (Mental Health and substance challenges) . [address] [place] [phone]    [email or web address]   . [facility]  . [place], [place]     Phone:  [phone] . [place]-  [place]  [phone]      . [employer] . [place]  [phone] . Alcohol & Drug  Services Walk-in MWF 12:30 to 3:00     . [place] [address]  [phone]  [email or web address] . Or call to schedule an appointment   . [place] ,Support group, Peer support services, . [address], [place], [place] [address] [phone]  [email or web address]      . [employer] on Mental Illness (NAMI) [place]- Wellness classes, Support groups        [address], [place], [place] [address] [phone]   CurrentJokes.cz  . [facility] [phone]  grief counseling, dementia and caregiver support . [employer]  (Psycho-social Rehabilitation clubhouse, Individual and group therapy) . [place], [place] [address]   [phone] . 24- Hour Availability:  . [place] or [phone]-([phone] . * [facility] [employer] (Domestic Violence, Rape, etc. )[phone] . [place] [phone] or 6176430121 . * [employer] [phone] only) [phone] (after hours) . *[facility] Mobile Crisis Unit [phone] . *[place] National Suicide Hotline (406)753-1238 (TALK) . Emotional/Supportive Counseling provided throughout entire session  Patient Self Care Activities:  . Calls provider office for new concerns or questions . Ability for insight . Independent living . Motivation for treatment  Patient Coping Strengths:  . Hopefulness . Self Advocate . Able to Communicate Effectively  Patient Self Care Deficits:  . Lacks social connections  Initial goal documentation      The care management team will reach out to the patient again over the next quarter.  Eula Fried, BSW, MSW, [facility] Care Management [email or web address] Phone: [phone]

## 2019-08-17 ENCOUNTER — Telehealth: Payer: Self-pay | Admitting: Nurse Practitioner

## 2019-08-17 NOTE — Telephone Encounter (Signed)
  Community Resource Referral   [place] 08/17/2019  1st Attempt  DOB: 11/01/1975   AGE: 44 y.o.   GENDER: female   PCP [name], NP.   Called pt regarding Community Resource Referral LMTCB Follow up on: 08/18/2019  [place] . [email or web address]  [phone]

## 2019-08-18 ENCOUNTER — Telehealth: Payer: Self-pay | Admitting: Pharmacy Technician

## 2019-08-18 NOTE — Telephone Encounter (Signed)
Email to pt From: Jill Alexanders [facility])  Sent: Tuesday, Aug 18, 2019 4:41 PM To: '[email or web address]> Subject: Secure: [place] [facility] - Caregiver Support  Good Afternoon Ms. Carles, Thank you for speaking with me today regarding community resources for caregiver support. [place] [facility] has many resources that they can connect you with. Please call their intake specialist  Burr Medico, [place] [facility] Caregiver Support Program Care Manager [phone]  Their website is [email or web address] and their resource guide can be found here [place], [place]  Please let me know if you need any further assistance,  [employer] . [email or web address]  [phone]

## 2019-08-18 NOTE — Telephone Encounter (Signed)
Patient received a 30 day supply of medication.  Provided patient with new patient packet to obtain ongoing [facility] services.  [facility] must receive requested financial documentation within 30 days in order to determine eligibility and provide additional medication assistance.  Betty J. Kluttz Care Manager [facility] 

## 2019-08-18 NOTE — Telephone Encounter (Signed)
°  Community Resource Referral   [place] 08/18/2019    DOB: [birth year]    AGE: 44 y.o.    GENDER: female    PCP [name], NP.   Pt returned call and we discussed caregiver support resources to help assist her with caring for her mother. She asked that I email her the resources for [employer]. Closing referral pending any other needs of patient. [place] Management [email or web address]   [id number]  ye

## 2019-08-18 NOTE — Telephone Encounter (Signed)
  Community Resource Referral   [place] 08/18/2019  2nd Attempt  DOB: 1975-07-12   AGE: 44 y.o.   GENDER: female   PCP [name], NP.   Called pt regarding Community Resource Referral LMTCB Follow up on: 08/19/2019  [place] . [email or web address]  [phone]

## 2019-09-04 ENCOUNTER — Ambulatory Visit: Payer: Self-pay | Admitting: Pharmacist

## 2019-09-04 DIAGNOSIS — E782 Mixed hyperlipidemia: Secondary | ICD-10-CM

## 2019-09-04 DIAGNOSIS — J449 Chronic obstructive pulmonary disease, unspecified: Secondary | ICD-10-CM

## 2019-09-04 DIAGNOSIS — Z638 Other specified problems related to primary support group: Secondary | ICD-10-CM

## 2019-09-04 DIAGNOSIS — F321 Major depressive disorder, single episode, moderate: Secondary | ICD-10-CM

## 2019-09-04 NOTE — Chronic Care Management (AMB) (Signed)
Chronic Care Management   Follow Up Note   09/04/2019 Name: Stefanie Stewart MRN: [id number] DOB: 1975/05/26  Referred by: Stefanie Lick, NP Reason for referral : Chronic Care Management (Medication Management)   Stefanie Stewart is a 44 y.o. year old female who is a primary care patient of [name], [name], NP. The CCM team was consulted for assistance with chronic disease management and care coordination needs.    Contacted patient for medication access follow up.   Review of patient status, including review of consultants reports, relevant laboratory and other test results, and collaboration with appropriate care team members and the patient's provider was performed as part of comprehensive patient evaluation and provision of chronic care management Stewart.    SDOH (Social Determinants of Health) assessments performed: Yes See Care Plan activities for detailed interventions related to Stefanie Stewart)     Outpatient Encounter Medications as of 09/04/2019  Medication Sig  . buPROPion (WELLBUTRIN XL) 150 MG 24 hr tablet Take 1 tablet (150 mg total) by mouth daily.  . cholecalciferol (VITAMIN D3) 25 MCG (1000 UNIT) tablet Take 1,000 Units by mouth daily.  . hydrochlorothiazide (HYDRODIURIL) 25 MG tablet Take 1 tablet (25 mg total) by mouth daily as needed.  . Melatonin 10 MG TABS Take 1 tablet by mouth at bedtime as needed.  . norethindrone (AYGESTIN) 5 MG tablet Take 2 tablets (10 mg total) by mouth daily.  . sertraline (ZOLOFT) 50 MG tablet Take 1.5 tablets (75 mg total) by mouth daily.  . Evolocumab (REPATHA SURECLICK) 629 MG/ML SOAJ Inject 140 mg into the skin every 14 (fourteen) days.  . methocarbamol (ROBAXIN) 500 MG tablet Take 1 tablet (500 mg total) by mouth every 8 (eight) hours as needed for muscle spasms. (Patient not taking: Reported on 08/04/2019)   No facility-administered encounter medications on file as of 09/04/2019.     Objective:   Goals Addressed               This Visit's Progress     Patient Stated   .  PharmD "I can't afford my medications" (pt-stated)        CARE PLAN ENTRY (see longtitudinal plan of care for additional care plan information)  Current Barriers:  . Polypharmacy; complex patient with multiple comorbidities including depression/anxiety, HLD, caregiver strain, endometriosis  . Financial concerns: patient is uninsured - [facility]. Received a 30 day supply of everything except Repatha. Reports that she is getting together her financial documentation to take back to [facility], hopefully next week. Struggling right now, as her dog is sick. However, did start a new job a few weeks ago, which has helped.  . Caregiver strain: patient is the primary caregiver for her mother since her father's passing o Depression/anxiety: sertraline 75 mg daily, bupropion XL 150 mg daily. Denies ever having been on higher doses of sertraline.  o HLD: sent order for Repatha 140 mg Q2week; working w/ Stefanie Stewart to complete patient assistance application. - Hx statin intolerance. Baseline LDL 175, hx pravastatin, rosuvastatin; Unknown family hx, as she is adopted. 10 year ASCVD risk 30% o Hormone therapy: norethindrone 10 mg daily; reports that 5 mg daily was tried, but still had breakthrough bleeding o Hx LEE: notes she has not needed to use HCTZ PRN edema in months  Pharmacist Clinical Goal(s):  [name] Over the next 90 days, patient will work with PharmD and provider towards optimized medication management  Interventions: . Encouraged to return paperwork to Stefanie Stewart  ASAP to ensure no lapse in connection w/ medication access resource. Reviewed plan for Repatha. Patient will continue to collaborate w/ Stefanie Stewart.    Patient Self Care Activities:  . Patient will take medications as prescribed  Please see past updates related to this goal by clicking on the "Past Updates" button in the selected goal          Plan:  - Scheduled f/u call in ~ 8  weeks  Stefanie Stewart, PharmD, [place] [phone]

## 2019-09-04 NOTE — Patient Instructions (Signed)
Visit Information  Goals Addressed              This Visit's Progress     Patient Stated   .  PharmD "I can't afford my medications" (pt-stated)        CARE PLAN ENTRY (see longtitudinal plan of care for additional care plan information)  Current Barriers:  . Polypharmacy; complex patient with multiple comorbidities including depression/anxiety, HLD, caregiver strain, endometriosis  . Financial concerns: patient is uninsured - [facility]. Received a 30 day supply of everything except Repatha. Reports that she is getting together her financial documentation to take back to [facility], hopefully next week. Struggling right now, as her dog is sick. However, did start a new job a few weeks ago, which has helped.  . Caregiver strain: patient is the primary caregiver for her mother since her father's passing o Depression/anxiety: sertraline 75 mg daily, bupropion XL 150 mg daily. Denies ever having been on higher doses of sertraline.  o HLD: sent order for Repatha 140 mg Q2week; working w/ [facility] to complete patient assistance application. - Hx statin intolerance. Baseline LDL 175, hx pravastatin, rosuvastatin; Unknown family hx, as she is adopted. 10 year ASCVD risk 30% o Hormone therapy: norethindrone 10 mg daily; reports that 5 mg daily was tried, but still had breakthrough bleeding o Hx LEE: notes she has not needed to use HCTZ PRN edema in months  Pharmacist Clinical Goal(s):  [name] Over the next 90 days, patient will work with PharmD and provider towards optimized medication management  Interventions: . Encouraged to return paperwork to [facility] ASAP to ensure no lapse in connection w/ medication access resource. Reviewed plan for Repatha. Patient will continue to collaborate w/ [facility].    Patient Self Care Activities:  . Patient will take medications as prescribed  Please see past updates related to this goal by clicking on the "Past Updates" button in the selected goal          Patient verbalizes understanding of instructions provided today.    Plan:  - Scheduled f/u call in ~ 8 weeks  Catie Darnelle Maffucci, PharmD, [place] [phone]

## 2019-09-22 ENCOUNTER — Telehealth: Payer: Self-pay

## 2019-09-22 ENCOUNTER — Telehealth: Payer: Self-pay | Admitting: General Practice

## 2019-09-22 ENCOUNTER — Ambulatory Visit: Payer: Self-pay | Admitting: General Practice

## 2019-09-22 DIAGNOSIS — I10 Essential (primary) hypertension: Secondary | ICD-10-CM

## 2019-09-22 DIAGNOSIS — J449 Chronic obstructive pulmonary disease, unspecified: Secondary | ICD-10-CM

## 2019-09-22 DIAGNOSIS — E782 Mixed hyperlipidemia: Secondary | ICD-10-CM

## 2019-09-22 DIAGNOSIS — Z638 Other specified problems related to primary support group: Secondary | ICD-10-CM

## 2019-09-22 DIAGNOSIS — F321 Major depressive disorder, single episode, moderate: Secondary | ICD-10-CM

## 2019-09-22 NOTE — Chronic Care Management (AMB) (Signed)
Care Management   Follow Up Note   09/22/2019 Name: Stefanie Stewart MRN: [id number] DOB: 1975-12-30  Referred by: Stefanie Lick, NP Reason for referral : Care Coordination (Follow up: Chronic Disease Management and Care Coordination Needs)   Stefanie Stewart is a 44 y.o. year old female who is a primary care patient of Stefanie Stewart, Stefanie Faster, NP. The care management team was consulted for assistance with care management and care coordination needs.    Review of patient status, including review of consultants reports, relevant laboratory and other test results, and collaboration with appropriate care team members and the patient's provider was performed as part of comprehensive patient evaluation and provision of chronic care management services.    SDOH (Social Determinants of Health) assessments performed: Yes See Care Plan activities for detailed interventions related to [facility])     Advanced Directives: See Care Plan and [employer] application for related entries.   Goals Addressed              This Visit's Progress     RNCM: Chronic Disease Management- HTN/HLD        CARE PLAN ENTRY (see longtitudinal plan of care for additional care plan information)  Current Barriers:   Chronic Disease Management support, education, and care coordination needs related to HTN and HLD  Clinical Goal(s) related to HTN and HLD:  Over the next 120 days, patient will:   Work with the care management team to address educational, disease management, and care coordination needs   Begin or continue self health monitoring activities as directed today Measure and record blood pressure 3 times per week and adhere to heart healthy diet  Call provider office for new or worsened signs and symptoms Blood pressure findings outside established parameters and New or worsened symptom related to HLD and other chronic conditions  Call care management team with questions or concerns  Verbalize basic understanding  of patient centered plan of care established today  Interventions related to HTN and HLD:   Evaluation of current treatment plans and patient's adherence to plan as established by provider  Assessed patient understanding of disease states  Assessed patient's education and care coordination needs.  The patient has not gotten her paperwork filled out yet for help with injectable cholesterol medication. Will work on this and try to get it into the office next week.   Provided disease specific education to patient. Education on a heart healthy diet. The patient verbalized she is eating well.   Collaborated with appropriate clinical care team members regarding patient needs.  The patient is working with pharmacist and LCSW.   Text sent to the patient to call the medication management clinic at [phone] to see if she can fax the paperwork to the office for assistance with medication needs.   Patient Self Care Activities related to HTN and HLD:   Patient is unable to independently self-manage chronic health conditions  Please see past updates related to this goal by clicking on the "Past Updates" button in the selected goal        RNCM: Pt-"I could use all the support I can get" (pt-stated)        [name] (see longitudinal plan of care for additional care plan information)  Current Barriers:   Knowledge Deficits related to resources available to help her manage her health and well being as evidence of caregiver role strain as she is the primary caregiver of her elderly mother  Care Coordination needs related  to resources in the community in a patient with depression and caregiver role strain (disease states)  Chronic Disease Management support and education needs related to depression  Lacks caregiver support. Has a part time paid caregiver but for the most part the patient is with her elderly mother Publishing rights manager.   Difficulty obtaining medications-  financial concerns due to not having insurance  Limited counseling options for the patient due to [place]  Unemployment due to losing her job in the [place] pandemic and having to sell her home and move in to take care of her elderly mother  Nurse Case Manager Clinical Goal(s):   Over the next 120 days, patient will verbalize understanding of plan for working with the CCM team and pcp to help the patient with managing her depression, stress, and other chronic conditions due to being the primary caregiver of her elderly mother.   Over the next 120 days, patient will work with CCM team, RNCM, and pcp to address needs related to depression, stress, and caregiver role strain  Over the next 120 days, patient will work with [employer] team pharmacist to assist with medication cost and resources to help with cost constraints of medication due to not having insurance.   Over the next 120 days, patient will work with CM clinical social worker to help with managing depression, stress, and caregiver role strain by implementation of effective coping mechanisms and counseling options  Over the next 120 days, patient will work with Care guides  (community agency) to assist with resources in the community to help the patient meet her needs as well as the needs of her elderly mother who she cares for  Over the next 120 days, the patient will demonstrate ongoing self health care management ability as evidenced by improved health and well being and no exacerbations with depression and anxiety in relation to caregiver role strain  Interventions:   Inter-disciplinary care team collaboration (see longitudinal plan of care)  Evaluation of current treatment plan related to depression and anxiety and patient's adherence to plan as established by provider.  Advised patient to check with her mothers provider for the resource of Right at Home: [phone] to see if this may be an option for help with her mother and help  with the caregiver role strain she is experiencing   Provided education to patient re: CCM team involvement and help with resources in the community  Collaborated with CCM team and pcp regarding patient needs and how to best help the patient with her depression and caregiver role strain. 09-22-2019: the patient admits she is feeling much better and doing okay. States that her mother is not doing as well and she almost lost her little dog recently but she is doing better now.  She is working again and this is helping her a lot.   Provided patient and/or caregiver with care guide information about resources in the community to help with her health and wellness needs (community resource).  Provided patient with coping and other  educational materials related to depression and taking care of self through the my Chart system.  Reviewed scheduled/upcoming provider appointments including: CCM team appointments. Will discuss with the LCSW if the patient can be scheduled sooner. The pharmacist will reach out this afternoon to the patient for help with medication cost constraints  Care Guide referral for resources in the community to help with the expressed needs of the patient  Social Work referral for help with coping mechanisms and  other resources to help with caregiver role strain, depression and stress  Pharmacy referral for help with medication cost due to the patient not having insurance  Patient Self Care Activities:   Patient verbalizes understanding of plan to work with the CCM team and pcp to meet her health and wellness needs and improve depressed state  Self administers medications as prescribed  Attends all scheduled provider appointments  Calls provider office for new concerns or questions  Unable to independently manage depression, stress, and anxiety due to caregiver role strain  Please see past updates related to this goal by clicking on the "Past Updates" button in the selected  goal          The care management team will reach out to the patient again over the next 30 to 60 days.   Noreene Larsson RN, MSN, [facility] Mobile: [phone]

## 2019-09-22 NOTE — Patient Instructions (Signed)
Visit Information  Goals Addressed              This Visit's Progress     RNCM: Chronic Disease Management- HTN/HLD        CARE PLAN ENTRY (see longtitudinal plan of care for additional care plan information)  Current Barriers:   Chronic Disease Management support, education, and care coordination needs related to HTN and HLD  Clinical Goal(s) related to HTN and HLD:  Over the next 120 days, patient will:   Work with the care management team to address educational, disease management, and care coordination needs   Begin or continue self health monitoring activities as directed today Measure and record blood pressure 3 times per week and adhere to heart healthy diet  Call provider office for new or worsened signs and symptoms Blood pressure findings outside established parameters and New or worsened symptom related to HLD and other chronic conditions  Call care management team with questions or concerns  Verbalize basic understanding of patient centered plan of care established today  Interventions related to HTN and HLD:   Evaluation of current treatment plans and patient's adherence to plan as established by provider  Assessed patient understanding of disease states  Assessed patient's education and care coordination needs.  The patient has not gotten her paperwork filled out yet for help with injectable cholesterol medication. Will work on this and try to get it into the office next week.   Provided disease specific education to patient. Education on a heart healthy diet. The patient verbalized she is eating well.   Collaborated with appropriate clinical care team members regarding patient needs.  The patient is working with pharmacist and LCSW.   Text sent to the patient to call the medication management clinic at [phone] to see if she can fax the paperwork to the office for assistance with medication needs.   Patient Self Care Activities related to HTN and HLD:     Patient is unable to independently self-manage chronic health conditions  Please see past updates related to this goal by clicking on the "Past Updates" button in the selected goal        RNCM: Pt-"I could use all the support I can get" (pt-stated)        [place] (see longitudinal plan of care for additional care plan information)  Current Barriers:   Knowledge Deficits related to resources available to help her manage her health and well being as evidence of caregiver role strain as she is the primary caregiver of her elderly mother  Care Coordination needs related to resources in the community in a patient with depression and caregiver role strain (disease states)  Chronic Disease Management support and education needs related to depression  Lacks caregiver support. Has a part time paid caregiver but for the most part the patient is with her elderly mother Publishing rights manager.   Difficulty obtaining medications- financial concerns due to not having insurance  Limited counseling options for the patient due to [place]  Unemployment due to losing her job in the [place] pandemic and having to sell her home and move in to take care of her elderly mother  Nurse Case Manager Clinical Goal(s):   Over the next 120 days, patient will verbalize understanding of plan for working with the CCM team and pcp to help the patient with managing her depression, stress, and other chronic conditions due to being the primary caregiver of her elderly mother.   Over the  next 120 days, patient will work with CCM team, RNCM, and pcp to address needs related to depression, stress, and caregiver role strain  Over the next 120 days, patient will work with [employer] team pharmacist to assist with medication cost and resources to help with cost constraints of medication due to not having insurance.   Over the next 120 days, patient will work with CM clinical social worker to help with managing  depression, stress, and caregiver role strain by implementation of effective coping mechanisms and counseling options  Over the next 120 days, patient will work with Care guides  (community agency) to assist with resources in the community to help the patient meet her needs as well as the needs of her elderly mother who she cares for  Over the next 120 days, the patient will demonstrate ongoing self health care management ability as evidenced by improved health and well being and no exacerbations with depression and anxiety in relation to caregiver role strain  Interventions:   Inter-disciplinary care team collaboration (see longitudinal plan of care)  Evaluation of current treatment plan related to depression and anxiety and patient's adherence to plan as established by provider.  Advised patient to check with her mothers provider for the resource of Right at Home: [phone] to see if this may be an option for help with her mother and help with the caregiver role strain she is experiencing   Provided education to patient re: CCM team involvement and help with resources in the community  Collaborated with CCM team and pcp regarding patient needs and how to best help the patient with her depression and caregiver role strain. 09-22-2019: the patient admits she is feeling much better and doing okay. States that her mother is not doing as well and she almost lost her little dog recently but she is doing better now.  She is working again and this is helping her a lot.   Provided patient and/or caregiver with care guide information about resources in the community to help with her health and wellness needs (community resource).  Provided patient with coping and other  educational materials related to depression and taking care of self through the my Chart system.  Reviewed scheduled/upcoming provider appointments including: CCM team appointments. Will discuss with the LCSW if the patient can be  scheduled sooner. The pharmacist will reach out this afternoon to the patient for help with medication cost constraints  Care Guide referral for resources in the community to help with the expressed needs of the patient  Social Work referral for help with coping mechanisms and other resources to help with caregiver role strain, depression and stress  Pharmacy referral for help with medication cost due to the patient not having insurance  Patient Self Care Activities:   Patient verbalizes understanding of plan to work with the CCM team and pcp to meet her health and wellness needs and improve depressed state  Self administers medications as prescribed  Attends all scheduled provider appointments  Calls provider office for new concerns or questions  Unable to independently manage depression, stress, and anxiety due to caregiver role strain  Please see past updates related to this goal by clicking on the "Past Updates" button in the selected goal         Patient verbalizes understanding of instructions provided today.   The care management team will reach out to the patient again over the next 30 to 60 days.   Noreene Larsson RN, MSN, [place]  [facility] Mobile: [phone]

## 2019-10-05 ENCOUNTER — Ambulatory Visit: Payer: No Typology Code available for payment source | Admitting: Pharmacy Technician

## 2019-10-05 ENCOUNTER — Other Ambulatory Visit: Payer: Self-pay

## 2019-10-05 DIAGNOSIS — Z79899 Other long term (current) drug therapy: Secondary | ICD-10-CM

## 2019-10-05 NOTE — Progress Notes (Signed)
Discussed applying for charity care for [facility] bills.  Patient stated that she does not have an outstanding balance with [facility].  Patient stated that she had applied for charity care prior to [place] and was awarded a 75% discount for a surgery.  Surgery was cancelled due to [place].  Physician now is treating condition with alternate means.    Completed [facility] application and contract.  Patient agreed to all terms of the [facility] contract.    Patient approved to receive medication assistance at [facility] until time for re-certification in 1586, and as long as eligibility criteria is continued to be met.    Referred patient for MTM  [facility]

## 2019-10-06 ENCOUNTER — Other Ambulatory Visit: Payer: Self-pay | Admitting: Nurse Practitioner

## 2019-10-06 MED ORDER — REPATHA SURECLICK 140 MG/ML ~~LOC~~ SOAJ: 140.0000 mg | SUBCUTANEOUS | 12 refills | Status: DC

## 2019-10-13 ENCOUNTER — Telehealth: Payer: Self-pay | Admitting: Nurse Practitioner

## 2019-10-13 NOTE — Telephone Encounter (Signed)
-----   Message from [name], NP sent at 10/06/2019  7:12 PM EDT ----- Needs follow-up for her cholesterol.  Please see if we can get her schedule for after I return from vacation.  Thanks.

## 2019-10-13 NOTE — Telephone Encounter (Signed)
lvm to call back to make this apt.

## 2019-10-14 NOTE — Telephone Encounter (Signed)
Lvm to make this apt. 

## 2019-10-15 ENCOUNTER — Telehealth: Payer: Self-pay | Admitting: Pharmacist

## 2019-10-15 NOTE — Telephone Encounter (Signed)
10/15/2019 11:56:40 AM - Repatha forms to pat & dr  -- Elmer Picker - Thursday, October 15, 2019 11:54 AM --Received pharmacy printout for Repatha 140mg /ml Sureclick Inject 140mg  into the skin every 14 days. Had to get help from [place] @ [place] on how to enter. Printed [employer] application--mailing patient her portion to sign & return, also mailing provider @ [facility] to sign & return.

## 2019-10-16 ENCOUNTER — Telehealth: Payer: Self-pay

## 2019-10-21 NOTE — Telephone Encounter (Signed)
Lvm to make this apt. 

## 2019-10-21 NOTE — Telephone Encounter (Signed)
Patient states that she would rather have reptha injection before scheduling follow up with Jolene [name]. She states this has been a long process and has just now gotten through the process for approval for injection.

## 2019-10-28 ENCOUNTER — Telehealth: Payer: Self-pay | Admitting: Pharmacist

## 2019-10-28 NOTE — Telephone Encounter (Signed)
10/28/2019 2:27:19 PM - Repatha pending  -- Elmer Picker - Wednesday, October 28, 2019 2:26 PM --I have received the patient's signed portion back, holding for provider to return their portion-mailed to provider 10/15/2019.

## 2019-11-04 ENCOUNTER — Telehealth: Payer: Self-pay

## 2019-11-05 ENCOUNTER — Telehealth: Payer: Self-pay | Admitting: Pharmacist

## 2019-11-05 NOTE — Telephone Encounter (Signed)
11/05/2019 9:56:49 AM - Repatha faxed to [place] - Thursday, November 05, 2019 9:54 AM --Virgel Gess [employer] application for Repatha 140mg  SureClick  Inject 140mg  into the skin every 14 days # 3 boxes, ICD-10 code: E78.2. Also faxed 2 check stubs with copy of 1040 tax forms with attachments.

## 2019-11-06 ENCOUNTER — Telehealth: Payer: Self-pay

## 2019-11-10 ENCOUNTER — Telehealth: Payer: Self-pay | Admitting: General Practice

## 2019-11-10 ENCOUNTER — Ambulatory Visit: Payer: Self-pay | Admitting: General Practice

## 2019-11-10 DIAGNOSIS — J449 Chronic obstructive pulmonary disease, unspecified: Secondary | ICD-10-CM

## 2019-11-10 DIAGNOSIS — F4321 Adjustment disorder with depressed mood: Secondary | ICD-10-CM

## 2019-11-10 DIAGNOSIS — F321 Major depressive disorder, single episode, moderate: Secondary | ICD-10-CM

## 2019-11-10 DIAGNOSIS — I10 Essential (primary) hypertension: Secondary | ICD-10-CM

## 2019-11-10 DIAGNOSIS — E782 Mixed hyperlipidemia: Secondary | ICD-10-CM

## 2019-11-10 NOTE — Patient Instructions (Signed)
Visit Information  Goals Addressed              This Visit's Progress     RNCM- Pt-"I buried my mother yesterday" (pt-stated)        CARE PLAN ENTRY (see longitudinal plan of care for additional care plan information)  Current Barriers:   Knowledge Deficits related to depression and grief  Care Coordination needs related to resources available for grief and depression in a patient with depression and new onset of grief with the recent death of her mother- funeral on 11-09-2019 (disease states)  Chronic Disease Management support and education needs related to depression and grief of loss of a parent  Nurse Case Manager Clinical Goal(s):   Over the next 120 days, patient will verbalize understanding of plan for working with the pcp, RNCM and CCM team to help with her depression and grief over the recent loss of her mother and being her primary caregiver for the last 4 years.   Over the next 120 days, patient will work with [facility] and pcp to address needs related to unresolved grief and depression  Over the next 120 days, patient will work with CM clinical social worker to work with the patient with new events of loss of a parent recently. The patients mother passed after a diagnosis of terminal cancer, 10 days after diagnosis, and was buried on 11-09-2019.   Interventions:   Inter-disciplinary care team collaboration (see longitudinal plan of care)  Evaluation of current treatment plan related to depression and grief and patient's adherence to plan as established by provider.  Advised patient to call the [facility] or LCSW for needing to vent or discuss feeling. Also to keep the lines of communication open with her family and friends. The patient verbalized she is mentally, emotionally, and physically exhausted. 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 She has been talking to a counselor but this cost 100.00 an hour  Provided education to patient re:  griefshare.org resources in her area that she can get involved with. It is a grief support group to help with dealing of loss of a loved one. The griefshare.org link sent by secure text messaging to the patient for review. The patient is thankful for the resource.   Discussed plans with patient for ongoing care management follow up and provided patient with direct contact information for care management team   Social Work referral for follow up for help with the loss and grief of losing her mother recently.   Patient Self Care Activities:   Patient verbalizes understanding of plan to work with the CCM team and the pcp for management of depression and anxiety  Self administers medications as prescribed  Attends all scheduled provider appointments  Calls provider office for new concerns or questions  Unable to independently manage grief over the recent loss of her elderly mother and depression   Lacks social connections  Initial goal documentation       RNCM: Chronic Disease Management- HTN/HLD        CARE PLAN ENTRY (see longtitudinal plan of care for additional care plan information)  Current Barriers:   Chronic Disease Management support, education, and care coordination needs related to HTN, HLD, and COPD  Clinical Goal(s) related to HTN, HLD, and COPD:  Over the next 120 days, patient will:   Work with the care management team to address educational, disease management, and care coordination needs   Begin or continue self  health monitoring activities as directed today Measure and record blood pressure 3 times per week and adhere to heart healthy diet  Call provider office for new or worsened signs and symptoms Blood pressure findings outside established parameters and New or worsened symptom related to HLD and other chronic conditions  Call care management team with questions or concerns  Verbalize basic understanding of patient centered plan of care established  today  Interventions related to HTN, HLD, and COPD:   Evaluation of current treatment plans and patient's adherence to plan as established by provider  Assessed patient understanding of disease states.  The patient has not been taking her blood pressure. The patient has been under a lot of stress and just lost her mother. Will work with patient to help with managing anxiety and stress  Assessed patient's education and care coordination needs.  The patient has not gotten her paperwork filled out yet for help with injectable cholesterol medication. Will work on this and try to get it into the office next week. 11-10-2019: The patient has gotten her Rapatha approved but she had to call the manufacture. She has done this and left them a long message.  She is waiting to hear back from the company. She knows she needed to get started on this medication. Education and support given.   Provided disease specific education to patient. Education on a heart healthy diet. The patient verbalized she is eating well. 11-10-2019: The patient states she has gained weight because she has been eating a lot. Talked about now that she doesn't have the caregiver responsibility that she needs to focus on her health and well being.   Collaborated with appropriate clinical care team members regarding patient needs.  The patient is working with pharmacist and LCSW.   Text sent to the patient to call the medication management clinic at [phone] to see if she can fax the paperwork to the office for assistance with medication needs.   Patient Self Care Activities related to HTN, HLD, and COPD:   Patient is unable to independently self-manage chronic health conditions  Please see past updates related to this goal by clicking on the "Past Updates" button in the selected goal        COMPLETED: RNCM: Pt-"I could use all the support I can get" (pt-stated)        CARE PLAN ENTRY (see longitudinal plan of care for additional  care plan information)  Current Barriers: The patients mother has passed away and her funeral was on 11-09-2019. The patient is processing her grief at this time. See updated careplan  Knowledge Deficits related to resources available to help her manage her health and well being as evidence of caregiver role strain as she is the primary caregiver of her elderly mother  Care Coordination needs related to resources in the community in a patient with depression and caregiver role strain (disease states)  Chronic Disease Management support and education needs related to depression  Lacks caregiver support. Has a part time paid caregiver but for the most part the patient is with her elderly mother Publishing rights manager.   Difficulty obtaining medications- financial concerns due to not having insurance  Limited counseling options for the patient due to [place]  Unemployment due to losing her job in the [place] pandemic and having to sell her home and move in to take care of her elderly mother  Nurse Case Manager Clinical Goal(s):   Over the next 120 days, patient will verbalize  understanding of plan for working with the CCM team and pcp to help the patient with managing her depression, stress, and other chronic conditions due to being the primary caregiver of her elderly mother.   Over the next 120 days, patient will work with CCM team, RNCM, and pcp to address needs related to depression, stress, and caregiver role strain  Over the next 120 days, patient will work with [employer] team pharmacist to assist with medication cost and resources to help with cost constraints of medication due to not having insurance.   Over the next 120 days, patient will work with CM clinical social worker to help with managing depression, stress, and caregiver role strain by implementation of effective coping mechanisms and counseling options  Over the next 120 days, patient will work with Care guides  (community  agency) to assist with resources in the community to help the patient meet her needs as well as the needs of her elderly mother who she cares for  Over the next 120 days, the patient will demonstrate ongoing self health care management ability as evidenced by improved health and well being and no exacerbations with depression and anxiety in relation to caregiver role strain  Interventions:   Inter-disciplinary care team collaboration (see longitudinal plan of care)  Evaluation of current treatment plan related to depression and anxiety and patient's adherence to plan as established by provider.  Advised patient to check with her mothers provider for the resource of Right at Home: [phone] to see if this may be an option for help with her mother and help with the caregiver role strain she is experiencing   Provided education to patient re: CCM team involvement and help with resources in the community  Collaborated with CCM team and pcp regarding patient needs and how to best help the patient with her depression and caregiver role strain. 09-22-2019: the patient admits she is feeling much better and doing okay. States that her mother is not doing as well and she almost lost her little dog recently but she is doing better now.  She is working again and this is helping her a lot.   Provided patient and/or caregiver with care guide information about resources in the community to help with her health and wellness needs (community resource).  Provided patient with coping and other  educational materials related to depression and taking care of self through the my Chart system.  Reviewed scheduled/upcoming provider appointments including: CCM team appointments. Will discuss with the LCSW if the patient can be scheduled sooner. The pharmacist will reach out this afternoon to the patient for help with medication cost constraints  Care Guide referral for resources in the community to help with the  expressed needs of the patient  Social Work referral for help with coping mechanisms and other resources to help with caregiver role strain, depression and stress  Pharmacy referral for help with medication cost due to the patient not having insurance  Patient Self Care Activities:   Patient verbalizes understanding of plan to work with the CCM team and pcp to meet her health and wellness needs and improve depressed state  Self administers medications as prescribed  Attends all scheduled provider appointments  Calls provider office for new concerns or questions  Unable to independently manage depression, stress, and anxiety due to caregiver role strain  Please see past updates related to this goal by clicking on the "Past Updates" button in the selected goal  Patient verbalizes understanding of instructions provided today.   Telephone follow up appointment with care management team member scheduled for: 12-23-2019 at 1 pm  Noreene Larsson RN, MSN, [facility] Mobile: [phone]

## 2019-11-10 NOTE — Chronic Care Management (AMB) (Signed)
Care Management   Follow Up Note   11/10/2019 Name: Stefanie Stewart MRN: [id number] DOB: 1976-02-20  Referred by: [name], NP Reason for referral : Care Coordination (RNCM Follow up Chronic Disease Management and Care Coordination Needs)   Stefanie Stewart is a 44 y.o. year old female who is a primary care patient of [name], [name], NP. The care management team was consulted for assistance with care management and care coordination needs.    Review of patient status, including review of consultants reports, relevant laboratory and other test results, and collaboration with appropriate care team members and the patient's provider was performed as part of comprehensive patient evaluation and provision of chronic care management services.    SDOH (Social Determinants of Health) assessments performed: Yes See Care Plan activities for detailed interventions related to [facility])     Advanced Directives: See Care Plan and Vynca application for related entries.   Goals Addressed              This Visit's Progress   .  RNCM- Pt-"I buried my mother yesterday" (pt-stated)        CARE PLAN ENTRY (see longitudinal plan of care for additional care plan information)  Current Barriers:  [name] Knowledge Deficits related to depression and grief . Care Coordination needs related to resources available for grief and depression in a patient with depression and new onset of grief with the recent death of her mother- funeral on 11-09-2019 (disease states) . Chronic Disease Management support and education needs related to depression and grief of loss of a parent  Nurse Case Manager Clinical Goal(s):  [name] Over the next 120 days, patient will verbalize understanding of plan for working with the pcp, RNCM and CCM team to help with her depression and grief over the recent loss of her mother and being her primary caregiver for the last 4 years.  . Over the next 120 days, patient will work with [facility] and  pcp to address needs related to unresolved grief and depression . Over the next 120 days, patient will work with CM clinical social worker to work with the patient with new events of loss of a parent recently. The patients mother passed after a diagnosis of terminal cancer, 10 days after diagnosis, and was buried on 11-09-2019.   Interventions:  . Inter-disciplinary care team collaboration (see longitudinal plan of care) . Evaluation of current treatment plan related to depression and grief and patient's adherence to plan as established by provider. . Advised patient to call the RNCM or LCSW for needing to vent or discuss feeling. Also to keep the lines of communication open with her family and friends. The patient verbalized she is mentally, emotionally, and physically exhausted. 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 She has been talking to a counselor but this cost 100.00 an hour . Provided education to patient re: griefshare.org resources in her area that she can get involved with. It is a grief support group to help with dealing of loss of a loved one. The griefshare.org link sent by secure text messaging to the patient for review. The patient is thankful for the resource.  . Discussed plans with patient for ongoing care management follow up and provided patient with direct contact information for care management team  . Social Work referral for follow up for help with the loss and grief of losing her mother recently.   Patient Self Care Activities:  .  Patient verbalizes understanding of plan to work with the CCM team and the pcp for management of depression and anxiety . Self administers medications as prescribed . Attends all scheduled provider appointments . Calls provider office for new concerns or questions . Unable to independently manage grief over the recent loss of her elderly mother and depression  . Lacks social connections  Initial goal documentation      .  RNCM: Chronic Disease Management- HTN/HLD        CARE PLAN ENTRY (see longtitudinal plan of care for additional care plan information)  Current Barriers:  . Chronic Disease Management support, education, and care coordination needs related to HTN, HLD, and COPD  Clinical Goal(s) related to HTN, HLD, and COPD:  Over the next 120 days, patient will:  . Work with the care management team to address educational, disease management, and care coordination needs  . Begin or continue self health monitoring activities as directed today Measure and record blood pressure 3 times per week and adhere to heart healthy diet . Call provider office for new or worsened signs and symptoms Blood pressure findings outside established parameters and New or worsened symptom related to HLD and other chronic conditions . Call care management team with questions or concerns . Verbalize basic understanding of patient centered plan of care established today  Interventions related to HTN, HLD, and COPD:  . Evaluation of current treatment plans and patient's adherence to plan as established by provider . Assessed patient understanding of disease states.  The patient has not been taking her blood pressure. The patient has been under a lot of stress and just lost her mother. Will work with patient to help with managing anxiety and stress . Assessed patient's education and care coordination needs.  The patient has not gotten her paperwork filled out yet for help with injectable cholesterol medication. Will work on this and try to get it into the office next week. 11-10-2019: The patient has gotten her Rapatha approved but she had to call the manufacture. She has done this and left them a long message.  She is waiting to hear back from the company. She knows she needed to get started on this medication. Education and support given.  . Provided disease specific education to patient. Education on a heart healthy diet. The  patient verbalized she is eating well. 11-10-2019: The patient states she has gained weight because she has been eating a lot. Talked about now that she doesn't have the caregiver responsibility that she needs to focus on her health and well being.  Nash Dimmer with appropriate clinical care team members regarding patient needs.  The patient is working with pharmacist and LCSW.  [name] Text sent to the patient to call the medication management clinic at [phone] to see if she can fax the paperwork to the office for assistance with medication needs.   Patient Self Care Activities related to HTN, HLD, and COPD:  . Patient is unable to independently self-manage chronic health conditions  Please see past updates related to this goal by clicking on the "Past Updates" button in the selected goal      .  COMPLETED: RNCM: Pt-"I could use all the support I can get" (pt-stated)        CARE PLAN ENTRY (see longitudinal plan of care for additional care plan information)  Current Barriers: The patients mother has passed away and her funeral was on 11-09-2019. The patient is processing her grief at this time. See  updated careplan . Knowledge Deficits related to resources available to help her manage her health and well being as evidence of caregiver role strain as she is the primary caregiver of her elderly mother . Care Coordination needs related to resources in the community in a patient with depression and caregiver role strain (disease states) . Chronic Disease Management support and education needs related to depression . Lacks caregiver support. Has a part time paid caregiver but for the most part the patient is with her elderly mother 24/7 . Film/video editor.  . Difficulty obtaining medications- financial concerns due to not having insurance . Limited counseling options for the patient due to [place] . Unemployment due to losing her job in the [place] pandemic and having to sell her home and move  in to take care of her elderly mother  Nurse Case Manager Clinical Goal(s):  [name] Over the next 120 days, patient will verbalize understanding of plan for working with the CCM team and pcp to help the patient with managing her depression, stress, and other chronic conditions due to being the primary caregiver of her elderly mother.  . Over the next 120 days, patient will work with CCM team, RNCM, and pcp to address needs related to depression, stress, and caregiver role strain . Over the next 120 days, patient will work with CM team pharmacist to assist with medication cost and resources to help with cost constraints of medication due to not having insurance.  . Over the next 120 days, patient will work with CM clinical social worker to help with managing depression, stress, and caregiver role strain by implementation of effective coping mechanisms and counseling options . Over the next 120 days, patient will work with Care guides  (community agency) to assist with resources in the community to help the patient meet her needs as well as the needs of her elderly mother who she cares for . Over the next 120 days, the patient will demonstrate ongoing self health care management ability as evidenced by improved health and well being and no exacerbations with depression and anxiety in relation to caregiver role strain  Interventions:  . Inter-disciplinary care team collaboration (see longitudinal plan of care) . Evaluation of current treatment plan related to depression and anxiety and patient's adherence to plan as established by provider. . Advised patient to check with her mothers provider for the resource of Right at Home: [phone] to see if this may be an option for help with her mother and help with the caregiver role strain she is experiencing  . Provided education to patient re: CCM team involvement and help with resources in the community . Collaborated with CCM team and pcp regarding patient  needs and how to best help the patient with her depression and caregiver role strain. 09-22-2019: the patient admits she is feeling much better and doing okay. States that her mother is not doing as well and she almost lost her little dog recently but she is doing better now.  She is working again and this is helping her a lot.  . Provided patient and/or caregiver with care guide information about resources in the community to help with her health and wellness needs (community resource). . Provided patient with coping and other  educational materials related to depression and taking care of self through the my Chart system. . Reviewed scheduled/upcoming provider appointments including: CCM team appointments. Will discuss with the LCSW if the patient can be scheduled sooner. The pharmacist will reach out  this afternoon to the patient for help with medication cost constraints . Care Guide referral for resources in the community to help with the expressed needs of the patient . Social Work referral for help with coping mechanisms and other resources to help with caregiver role strain, depression and stress . Pharmacy referral for help with medication cost due to the patient not having insurance  Patient Self Care Activities:  . Patient verbalizes understanding of plan to work with the CCM team and pcp to meet her health and wellness needs and improve depressed state . Self administers medications as prescribed . Attends all scheduled provider appointments . Calls provider office for new concerns or questions . Unable to independently manage depression, stress, and anxiety due to caregiver role strain  Please see past updates related to this goal by clicking on the "Past Updates" button in the selected goal          Telephone follow up appointment with care management team member scheduled for: 12-23-2019 at 1 pm  [place], MSN, [facility] Mobile: [phone]

## 2019-11-13 NOTE — Telephone Encounter (Signed)
Pt called asking for a nurse to call her about the repatha.  She wasn't to know the side effects of this drug and how to administer it.  [phone]

## 2019-11-13 NOTE — Telephone Encounter (Signed)
Please let her know there are minimal side effects, in fact I have had no patients with adverse reactions.  Main seen in research was sinus like issues, but this was low percentage.  She will inject this every 14 days, recommend soft tissue area because it is going subcutaneous -- so belly is a good spot.  If she needs help with this and how to do I have added Almyra Free and Pam here too:)

## 2019-11-13 NOTE — Telephone Encounter (Signed)
Routing to provider. What advice can we give to the patient?

## 2019-11-16 NOTE — Telephone Encounter (Signed)
Called and left a detailed VM letting patient know what Jolene said. (DPR verified for the VM)

## 2019-11-16 NOTE — Telephone Encounter (Signed)
Best to store in [place], would like to see in 6-8 weeks please.:)

## 2019-11-16 NOTE — Telephone Encounter (Signed)
Called and spoke to patient. Advised her of Jolene's message. Patient wants to know if she needs to store the medication in the fridge or room temp? Also asked when Jolene would like to see the patient back after she starts the mediation? She states that she is waiting on the pharmacy to get in the prescription and then she will be able to start it.

## 2019-11-22 ENCOUNTER — Other Ambulatory Visit: Payer: Self-pay | Admitting: Nurse Practitioner

## 2019-11-22 NOTE — Telephone Encounter (Signed)
Change of pharmacy Requested Prescriptions  Pending Prescriptions Disp Refills   norethindrone (AYGESTIN) 5 MG tablet [Pharmacy Med Name: Norethindrone Acetate 5 MG Oral Tablet] 90 tablet 0    Sig: Take 1 tablet by mouth once daily     OB/GYN: Contraceptives - Progestins Passed - 11/22/2019  6:47 AM      Passed - Valid encounter within last 12 months    Recent Outpatient Visits          3 months ago Current moderate episode of major depressive disorder without prior episode ([place])   [place] [name], Jolene T, NP   6 months ago Current moderate episode of major depressive disorder without prior episode ([place])   [place] [name], Jolene T, NP   7 months ago Current moderate episode of major depressive disorder without prior episode ([place])   [place], Jolene T, NP   10 months ago Current moderate episode of major depressive disorder without prior episode ([place])   [place], Jolene T, NP   1 year ago Current moderate episode of major depressive disorder without prior episode ([place])   [place] [name], [name], NP

## 2019-11-30 IMAGING — MG DIGITAL SCREENING BILATERAL MAMMOGRAM WITH TOMO AND CAD
8 series · 8 of 24 positions shown · non-contrast
Comparison: Previous exam(s).

CLINICAL DATA: Screening.

EXAM:
DIGITAL SCREENING BILATERAL MAMMOGRAM WITH TOMO AND CAD

[R MLO synth-2D]
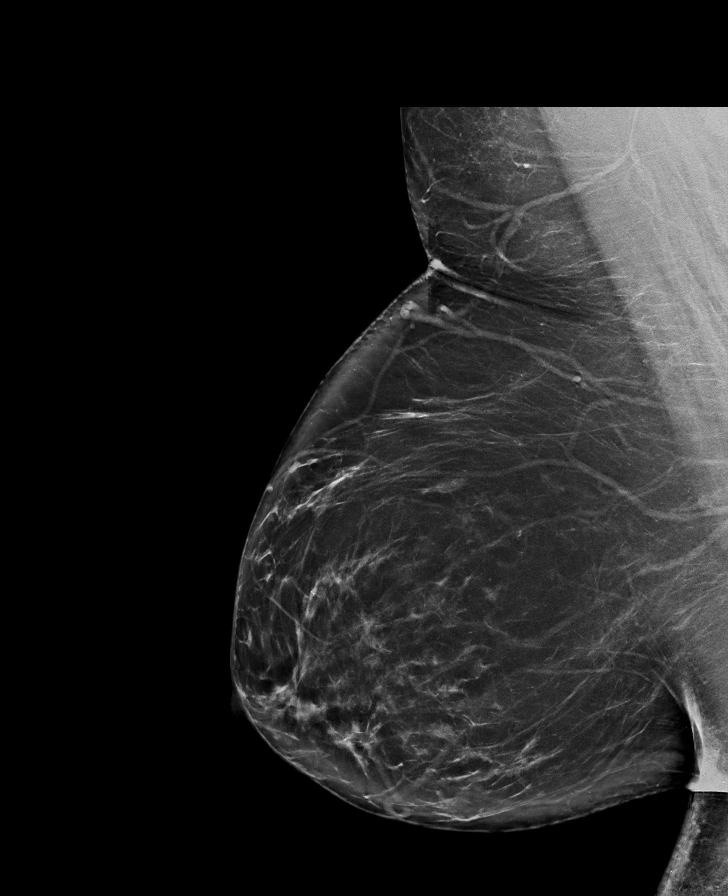

[L MLO synth-2D]
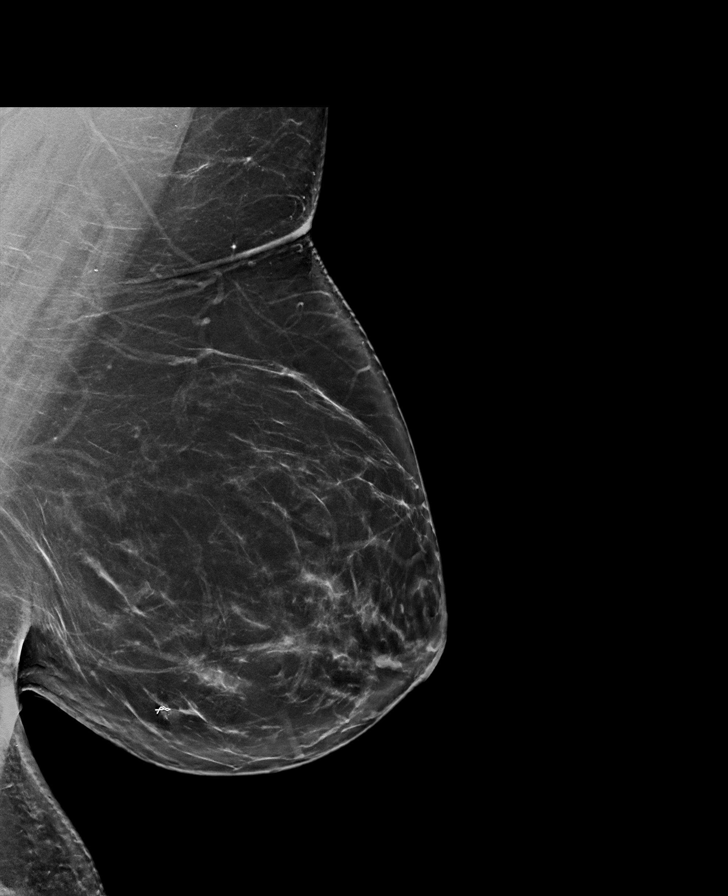

[R CC synth-2D]
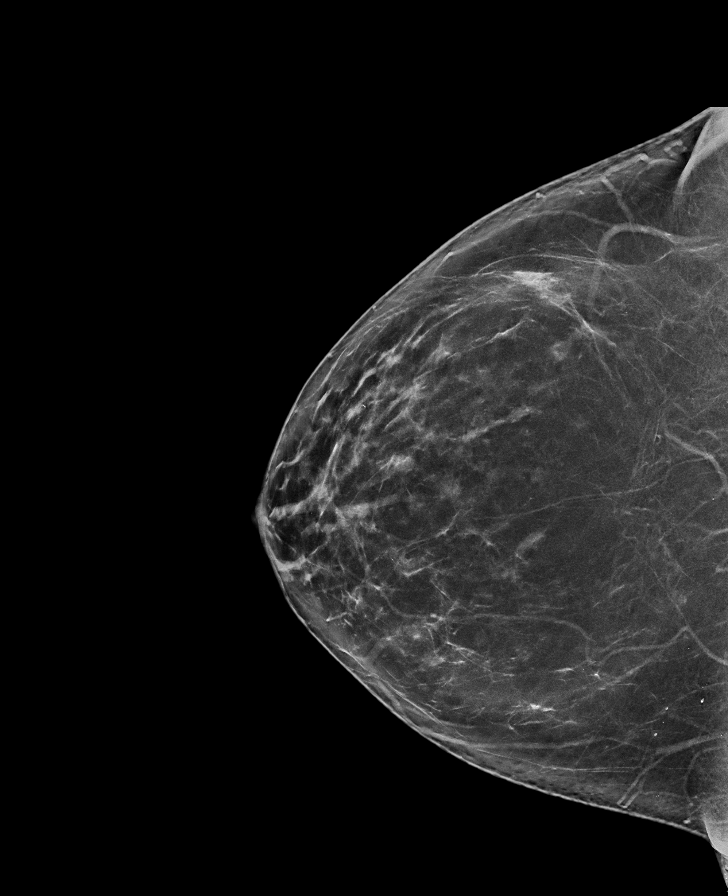

[L CC synth-2D]
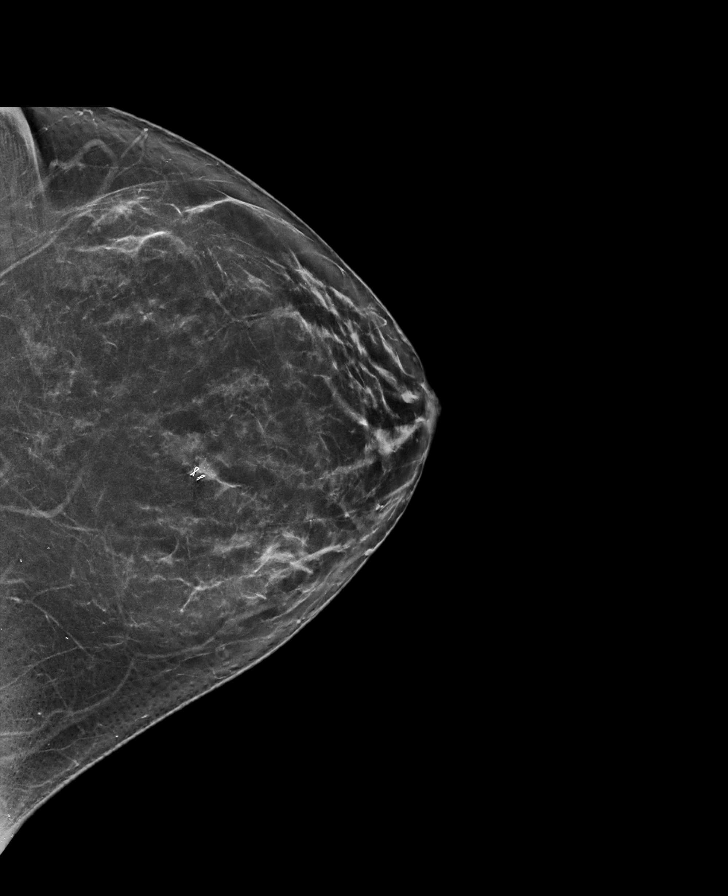

[L CC tomo · tomo slice 38/75.0]
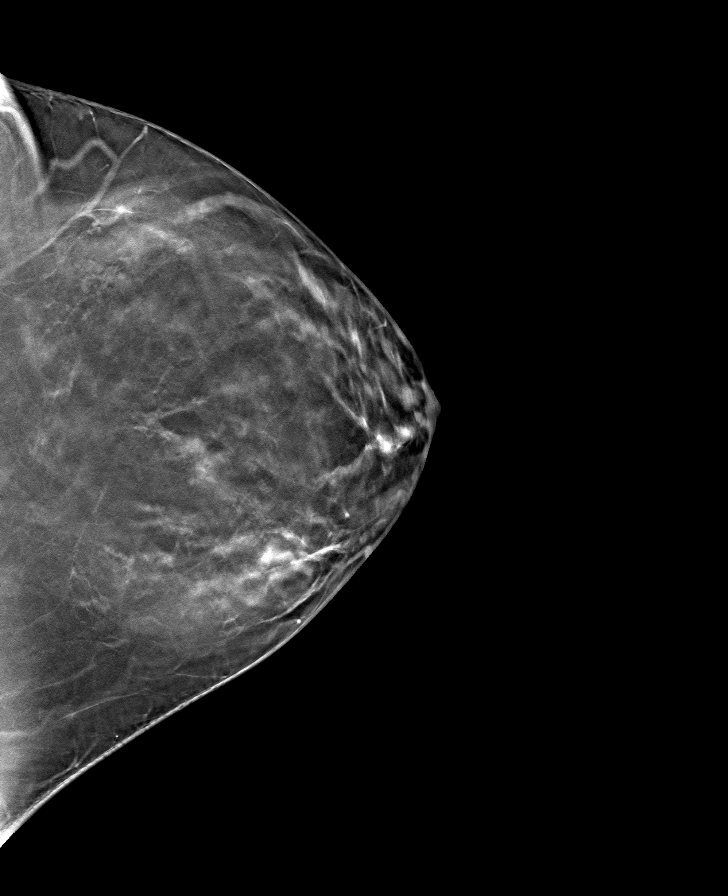

[R MLO tomo · tomo slice 47/93.0]
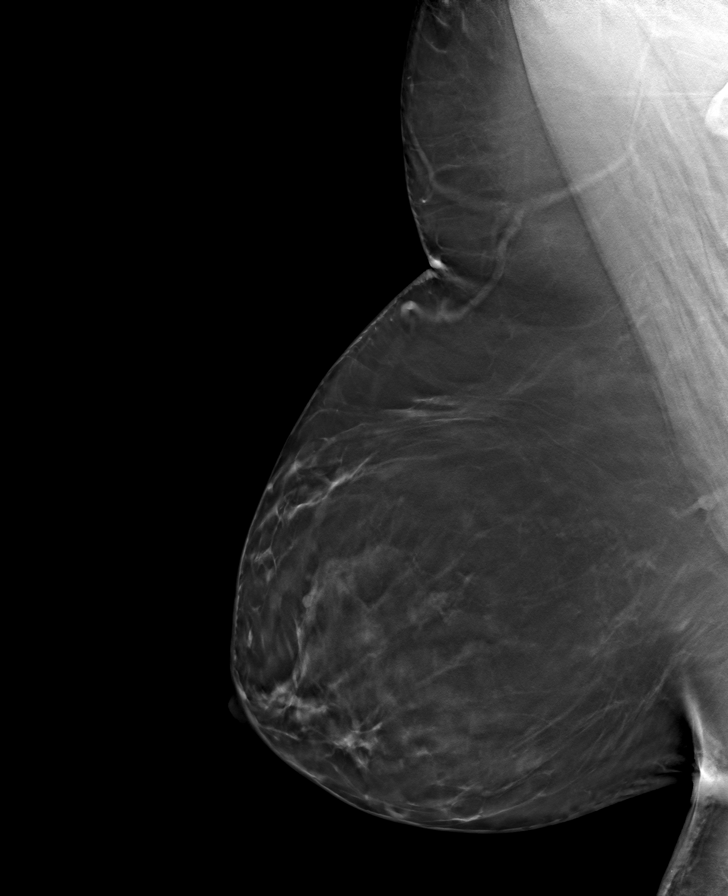

[R CC tomo · tomo slice 39/78.0]
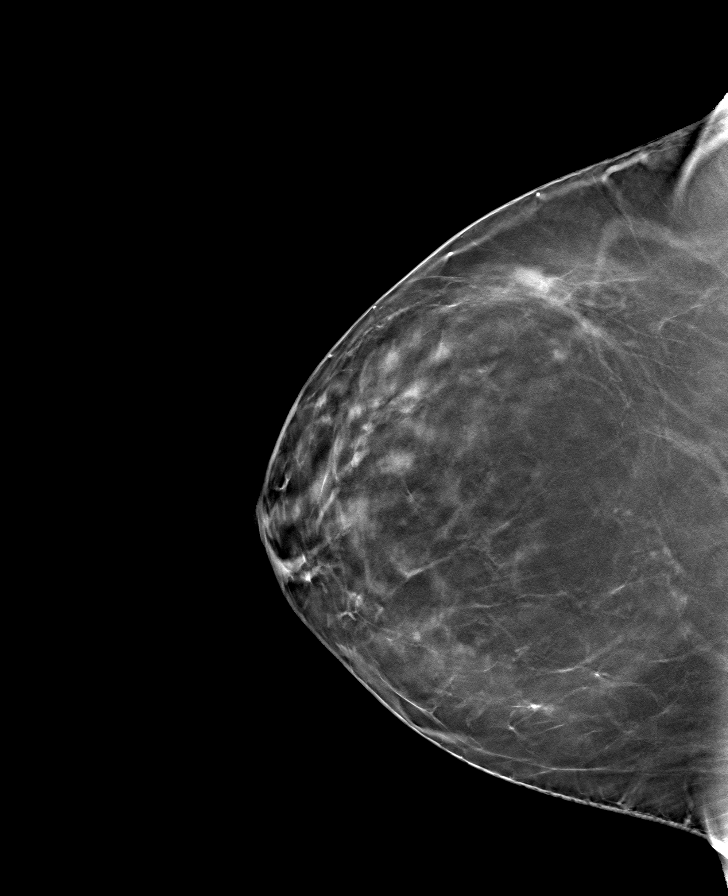

[L MLO tomo · tomo slice 47/93.0]
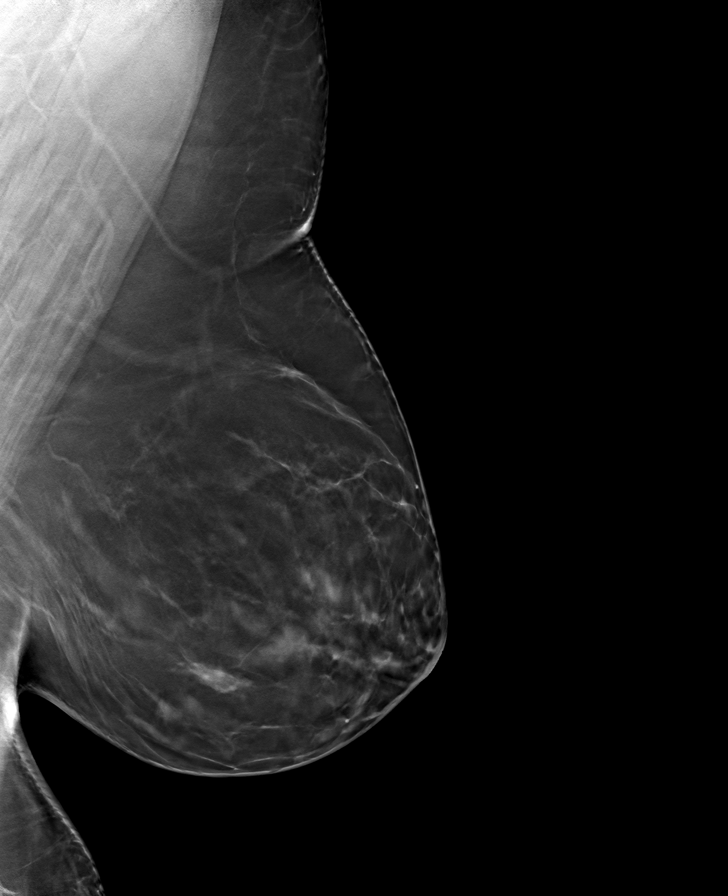

[8 of 24 positions shown; findings below may reference images not displayed]

ACR Breast Density Category b: There are scattered areas of
fibroglandular density.
FINDINGS: There are no findings suspicious for malignancy. Images were
processed with CAD.
IMPRESSION: No mammographic evidence of malignancy. A result letter of this
screening mammogram will be mailed directly to the patient.

RECOMMENDATION:
Screening mammogram in one year. (Code:[id number])

BI-RADS CATEGORY  1: Negative.

## 2019-12-03 ENCOUNTER — Telehealth: Payer: Self-pay | Admitting: Pharmacy Technician

## 2019-12-03 NOTE — Telephone Encounter (Signed)
Patient called Stefanie Stewart and asked whether MTM appointment was in-person or phone appointment.  Kayla asked that I contact patient with information.  I attempted to call patient.  Unable to reach.  Left message.  [facility]

## 2019-12-07 ENCOUNTER — Ambulatory Visit: Payer: No Typology Code available for payment source

## 2019-12-07 ENCOUNTER — Telehealth: Payer: Self-pay | Admitting: Pharmacist

## 2019-12-07 ENCOUNTER — Telehealth: Payer: Self-pay

## 2019-12-07 ENCOUNTER — Other Ambulatory Visit: Payer: Self-pay

## 2019-12-07 NOTE — Telephone Encounter (Signed)
12/07/2019 11:16:19 AM - Talked with patient on [place] - Monday, December 07, 2019 11:12 AM --Rec'd vm to call patient [phone]-called patient & explained what representative @ [employer] told me-patient has to call them to set up shipment, and she can request that they ship to our office. She will call them and let me know what is decided on where they will ship to. Did explain to patient that if she receives at home, she will need to bring [name] the paperwork that comes with the med. Also she will need to be sure the medication is cold when cooler is opened. She stated this seems like alot of trouble. Her mother just recently died & she is dealing with estate now.

## 2019-12-07 NOTE — Progress Notes (Unsigned)
[facility] Visit Note  Patient: Stefanie Stewart MRN: [id number] Date of Birth: [birth year] PCP: [name], NP   Stefanie Stewart 44 y.o. female presents for a MTM visit today.  There were no vitals taken for this visit.  Patient Information   Past Medical History:  Diagnosis Date  . Anxiety   . Cervical syndrome   . COPD (chronic obstructive pulmonary disease) ([place])   . Depression   . Edema 2013   legs  . Endometriosis   . Hyperlipidemia   . Hypertension   . IBS (irritable bowel syndrome)   . Lump or mass in breast    left   . Obesity   . Plantar fibromatosis   . Tobacco abuse       Past Surgical History:  Procedure Laterality Date  . BREAST BIOPSY Right   . CERVICAL BIOPSY  W/ LOOP ELECTRODE EXCISION    . CESAREAN SECTION  1996  . COLPOSCOPY  2007  . LEEP  2007     Family History  Adopted: Yes  Problem Relation Age of Onset  . COPD Mother   . Breast cancer Neg Hx     New Diagnoses (since last visit):   Family Support: Good           Social History   Substance and Sexual Activity  Alcohol Use Never      Social History   Tobacco Use  Smoking Status Former Smoker  . Packs/day: 0.00  . Years: 20.00  . Pack years: 0.00  Smokeless Tobacco Never Used      Health Maintenance  Topic Date Due  . Hepatitis C Screening  Never done  . HIV Screening  Never done  . INFLUENZA VACCINE  10/25/2019  . PAP SMEAR-Modifier  12/21/2019  . TETANUS/TDAP  03/01/2027  . COVID-19 Vaccine  Completed    Health Maintenance/Date Completed  Last ED visit: 2018 Last Visit to PCP: Feb Stefanie Stewart) Next Visit to PCP: Patient reports she will be seeing her PCP after she receives Reptha Dental Exam: 2021 Pelvic/PAP Exam: 2018 Mammogram: 2021 Flu Vaccine: not yet COVID-19 Vaccine: [place] 07/14/2019  Outpatient Encounter Medications as of 12/07/2019  Medication Sig  . acetaminophen (TYLENOL) 500 MG tablet Take 500 mg by mouth every 6 (six)  hours as needed. Takes 2 QAM and 2 QPM  . buPROPion (WELLBUTRIN XL) 150 MG 24 hr tablet Take 1 tablet (150 mg total) by mouth daily.  . cholecalciferol (VITAMIN D3) 25 MCG (1000 UNIT) tablet Take 1,000 Units by mouth daily.  . Melatonin 10 MG TABS Take 1 tablet by mouth at bedtime as needed.  . methocarbamol (ROBAXIN) 500 MG tablet Take 1 tablet (500 mg total) by mouth every 8 (eight) hours as needed for muscle spasms.  . norethindrone (AYGESTIN) 5 MG tablet Take 1 tablet by mouth once daily  . sertraline (ZOLOFT) 50 MG tablet Take 1.5 tablets (75 mg total) by mouth daily.  . Evolocumab (REPATHA SURECLICK) 299 MG/ML SOAJ Inject 140 mg into the skin every 14 (fourteen) days.  . hydrochlorothiazide (HYDRODIURIL) 25 MG tablet Take 1 tablet (25 mg total) by mouth daily as needed. (Patient not taking: Reported on 12/07/2019)   No facility-administered encounter medications on file as of 12/07/2019.      Assessment and Plan:   HLD: Patient has not received Repatha. Referred patient to Medication Management PAP coordinator, Stefanie Stewart; patient plans to call her today. -Patient desired to speak with Stefanie Stewart as soon as possible,  therefore did not discuss diet and exercise today. Recommend discussing diet and exercise during future MTM visits. Diet and exercise should be congruent with lowering risks related to HLD and HTN.  Smoking cessation: Patient currently vaping tobacco daily. Referred patient to 1800QUITLINE. Patient declined.  COPD: Patient did not report diagnosis of COPD, and does not use an inhaler.   HTN/fluid: patient reports not taking, and explains that this medication was not prescribed for HTN, but instead for leg swelling. Patient reports the medication caused her to feel unwell, due to diuresis effect. Not currently taking.  Neck and back pain: Patient experiencing more severe pain recently, therefore is taking methocarbamol frequently ([facility]ceiving from [facility]). Patient is also taking  Tylenol 500 mg 2 tab QAM and QHS for pain.         Dimple Nanas PharmD Student

## 2019-12-14 ENCOUNTER — Telehealth: Payer: Self-pay | Admitting: Nurse Practitioner

## 2019-12-14 NOTE — Telephone Encounter (Signed)
Routing to provider to advise.  

## 2019-12-14 NOTE — Telephone Encounter (Signed)
This looks like the repatha, which is self-administered. I'm not sure what she needs. She is OK to start it today or when she gets back- whichever is more convenient for her

## 2019-12-14 NOTE — Telephone Encounter (Signed)
Patient is calling to ask advice of Jolene. Patient received injection for her Cholesterol. Patient has delayed taking the injection and she is open to starting the injection today. Patient is aware that the injection is every two week. However, she is going out of town on 12/27/19. Patient would like to know is it ok starting today? Or should she start the process when she come back on 01/04/20.  Patient have never taken taken the medication. Please advise CB- [phone] Ok to leave VM as the patient is working.

## 2019-12-14 NOTE — Telephone Encounter (Signed)
Patient notified of Dr. Durenda Age response.

## 2019-12-23 ENCOUNTER — Telehealth: Payer: Self-pay | Admitting: General Practice

## 2019-12-23 ENCOUNTER — Ambulatory Visit: Payer: Self-pay | Admitting: General Practice

## 2019-12-23 DIAGNOSIS — F321 Major depressive disorder, single episode, moderate: Secondary | ICD-10-CM

## 2019-12-23 DIAGNOSIS — F4321 Adjustment disorder with depressed mood: Secondary | ICD-10-CM

## 2019-12-23 DIAGNOSIS — E782 Mixed hyperlipidemia: Secondary | ICD-10-CM

## 2019-12-23 DIAGNOSIS — I10 Essential (primary) hypertension: Secondary | ICD-10-CM

## 2019-12-23 DIAGNOSIS — J449 Chronic obstructive pulmonary disease, unspecified: Secondary | ICD-10-CM

## 2019-12-23 NOTE — Patient Instructions (Signed)
Visit Information  Goals Addressed              This Visit's Progress   .  RNCM- Pt-"I buried my mother yesterday" (pt-stated)        CARE PLAN ENTRY (see longitudinal plan of care for additional care plan information)  Current Barriers:  [name] Knowledge Deficits related to depression and grief . Care Coordination needs related to resources available for grief and depression in a patient with depression and new onset of grief with the recent death of her mother- funeral on 11-09-2019 (disease states) . Chronic Disease Management support and education needs related to depression and grief of loss of a parent  Nurse Case Manager Clinical Goal(s):  [name] Over the next 120 days, patient will verbalize understanding of plan for working with the pcp, RNCM and CCM team to help with her depression and grief over the recent loss of her mother and being her primary caregiver for the last 4 years.  . Over the next 120 days, patient will work with [facility] and pcp to address needs related to unresolved grief and depression . Over the next 120 days, patient will work with CM clinical social worker to work with the patient with new events of loss of a parent recently. The patients mother passed after a diagnosis of terminal cancer, 10 days after diagnosis, and was buried on 11-09-2019.   Interventions:  . Inter-disciplinary care team collaboration (see longitudinal plan of care) . Evaluation of current treatment plan related to depression and grief and patient's adherence to plan as established by provider. 12-23-2019: The patient is going to counseling and this is helping her a lot with her grief and anger over the loss of her mother.  . Advised patient to call the RNCM or LCSW for needing to vent or discuss feeling. Also to keep the lines of communication open with her family and friends. The patient verbalized she is mentally, emotionally, and physically exhausted. She has been the caregiver of her elderly mother  for the last 4 years. She has been talking to a counselor but this cost 100.00 an hour . Provided education to patient re: griefshare.org resources in her area that she can get involved with. It is a grief support group to help with dealing of loss of a loved one. The griefshare.org link sent by secure text messaging to the patient for review. The patient is thankful for the resource.  . Discussed plans with patient for ongoing care management follow up and provided patient with direct contact information for care management team  . Social Work referral for follow up for help with the loss and grief of losing her mother recently.   Patient Self Care Activities:  . Patient verbalizes understanding of plan to work with the CCM team and the pcp for management of depression and anxiety . Self administers medications as prescribed . Attends all scheduled provider appointments . Calls provider office for new concerns or questions . Unable to independently manage grief over the recent loss of her elderly mother and depression  . Lacks social connections  Please see past updates related to this goal by clicking on the "Past Updates" button in the selected goal      .  RNCM: Chronic Disease Management- HTN/HLD        CARE PLAN ENTRY (see longtitudinal plan of care for additional care plan information)  Current Barriers:  . Chronic Disease Management support, education, and care coordination needs related to HTN,  HLD, and COPD  Clinical Goal(s) related to HTN, HLD, and COPD:  Over the next 120 days, patient will:  . Work with the care management team to address educational, disease management, and care coordination needs  . Begin or continue self health monitoring activities as directed today Measure and record blood pressure 3 times per week and adhere to heart healthy diet . Call provider office for new or worsened signs and symptoms Blood pressure findings outside established parameters and New or  worsened symptom related to HLD and other chronic conditions . Call care management team with questions or concerns . Verbalize basic understanding of patient centered plan of care established today  Interventions related to HTN, HLD, and COPD:  . Evaluation of current treatment plans and patient's adherence to plan as established by provider.  12-23-2019: The patient is working on getting her life back together after the loss of her mother and being the primary caregiver. She and her son are leaving on 12-27-2019 for a trip to [place]. The patient states this is the first vacation she has been on in 20 years. She is excited.  . Assessed patient understanding of disease states.  The patient has not been taking her blood pressure. The patient has been under a lot of stress and just lost her mother. Will work with patient to help with managing anxiety and stress.  12-23-2019: the patient has not been taking her blood pressure but is taking her Raptha now. Discussed lifestyle changes to help with management of her chronic conditions.  . Assessed patient's education and care coordination needs.  The patient has not gotten her paperwork filled out yet for help with injectable cholesterol medication. Will work on this and try to get it into the office next week. 11-10-2019: The patient has gotten her Rapatha approved but she had to call the manufacture. She has done this and left them a long message.  She is waiting to hear back from the company. She knows she needed to get started on this medication. Education and support given. 12-23-2019: the patient is now taking the [place]. Praised the patient for taking her injections. She will take again before leaving on her trip.  [name] Provided disease specific education to patient. Education on a heart healthy diet. The patient verbalized she is eating well. 12-23-2019: The patient states she has gained weight because she has been eating a lot. Talked about now that she doesn't  have the caregiver responsibility that she needs to focus on her health and well being. Eating habits are still not good but the patient is going to go on vacation and then she will work on everything else. Education on watching sodium and fats in her diet. The patient verbalized understanding.  Nash Dimmer with appropriate clinical care team members regarding patient needs.  The patient is working with pharmacist and LCSW.  [name] Text sent to the patient to call the medication management clinic at [phone] to see if she can fax the paperwork to the office for assistance with medication needs.   Patient Self Care Activities related to HTN, HLD, and COPD:  . Patient is unable to independently self-manage chronic health conditions  Please see past updates related to this goal by clicking on the "Past Updates" button in the selected goal         Patient verbalizes understanding of instructions provided today.   Telephone follow up appointment with care management team member scheduled for: 02-17-2020 at 230 pm  Pam  Duard Brady, MSN, [facility] Mobile: [phone]

## 2019-12-23 NOTE — Chronic Care Management (AMB) (Signed)
Care Management   Follow Up Note   12/23/2019 Name: Stefanie Stewart MRN: [id number] DOB: Sep 28, 1975  Referred by: [name], NP Reason for referral : Care Coordination (RNCM Follow up call for Chronic Disease Management and Care Coordination Needs)   Stefanie Stewart is a 44 y.o. year old female who is a primary care patient of [name], [name], NP. The care management team was consulted for assistance with care management and care coordination needs.    Review of patient status, including review of consultants reports, relevant laboratory and other test results, and collaboration with appropriate care team members and the patient's provider was performed as part of comprehensive patient evaluation and provision of chronic care management services.    SDOH (Social Determinants of Health) assessments performed: Yes See Care Plan activities for detailed interventions related to [facility])     Advanced Directives: See Care Plan and Vynca application for related entries.  Lab Results  Component Value Date   CHOL 299 (H) 01/26/2019   HDL 23 (L) 01/26/2019   LDLCALC 173 (H) 01/26/2019   TRIG 509 (H) 01/26/2019    Goals Addressed              This Visit's Progress   .  RNCM- Pt-"I buried my mother yesterday" (pt-stated)        CARE PLAN ENTRY (see longitudinal plan of care for additional care plan information)  Current Barriers:  [name] Knowledge Deficits related to depression and grief . Care Coordination needs related to resources available for grief and depression in a patient with depression and new onset of grief with the recent death of her mother- funeral on 11-09-2019 (disease states) . Chronic Disease Management support and education needs related to depression and grief of loss of a parent  Nurse Case Manager Clinical Goal(s):  [name] Over the next 120 days, patient will verbalize understanding of plan for working with the pcp, RNCM and CCM team to help with her depression and  grief over the recent loss of her mother and being her primary caregiver for the last 4 years.  . Over the next 120 days, patient will work with [facility] and pcp to address needs related to unresolved grief and depression . Over the next 120 days, patient will work with CM clinical social worker to work with the patient with new events of loss of a parent recently. The patients mother passed after a diagnosis of terminal cancer, 10 days after diagnosis, and was buried on 11-09-2019.   Interventions:  . Inter-disciplinary care team collaboration (see longitudinal plan of care) . Evaluation of current treatment plan related to depression and grief and patient's adherence to plan as established by provider. 12-23-2019: The patient is going to counseling and this is helping her a lot with her grief and anger over the loss of her mother.  . Advised patient to call the RNCM or LCSW for needing to vent or discuss feeling. Also to keep the lines of communication open with her family and friends. The patient verbalized she is mentally, emotionally, and physically exhausted. 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 She has been talking to a counselor but this cost 100.00 an hour . Provided education to patient re: griefshare.org resources in her area that she can get involved with. It is a grief support group to help with dealing of loss of a loved one. The griefshare.org link sent by secure text messaging to the  patient for review. The patient is thankful for the resource.  . Discussed plans with patient for ongoing care management follow up and provided patient with direct contact information for care management team  . Social Work referral for follow up for help with the loss and grief of losing her mother recently.   Patient Self Care Activities:  . Patient verbalizes understanding of plan to work with the CCM team and the pcp for management of depression and anxiety . Self  administers medications as prescribed . Attends all scheduled provider appointments . Calls provider office for new concerns or questions . Unable to independently manage grief over the recent loss of her elderly mother and depression  . Lacks social connections  Please see past updates related to this goal by clicking on the "Past Updates" button in the selected goal      .  RNCM: Chronic Disease Management- HTN/HLD        CARE PLAN ENTRY (see longtitudinal plan of care for additional care plan information)  Current Barriers:  . Chronic Disease Management support, education, and care coordination needs related to HTN, HLD, and COPD  Clinical Goal(s) related to HTN, HLD, and COPD:  Over the next 120 days, patient will:  . Work with the care management team to address educational, disease management, and care coordination needs  . Begin or continue self health monitoring activities as directed today Measure and record blood pressure 3 times per week and adhere to heart healthy diet . Call provider office for new or worsened signs and symptoms Blood pressure findings outside established parameters and New or worsened symptom related to HLD and other chronic conditions . Call care management team with questions or concerns . Verbalize basic understanding of patient centered plan of care established today  Interventions related to HTN, HLD, and COPD:  . Evaluation of current treatment plans and patient's adherence to plan as established by provider.  12-23-2019: The patient is working on getting her life back together after the loss of her mother and being the primary caregiver. She and her son are leaving on 12-27-2019 for a trip to [place]. The patient states this is the first vacation she has been on in 20 years. She is excited.  . Assessed patient understanding of disease states.  The patient has not been taking her blood pressure. The patient has been under a lot of stress and just lost her  mother. Will work with patient to help with managing anxiety and stress.  12-23-2019: the patient has not been taking her blood pressure but is taking her Raptha now. Discussed lifestyle changes to help with management of her chronic conditions.  . Assessed patient's education and care coordination needs.  The patient has not gotten her paperwork filled out yet for help with injectable cholesterol medication. Will work on this and try to get it into the office next week. 11-10-2019: The patient has gotten her Rapatha approved but she had to call the manufacture. She has done this and left them a long message.  She is waiting to hear back from the company. She knows she needed to get started on this medication. Education and support given. 12-23-2019: the patient is now taking the [place]. Praised the patient for taking her injections. She will take again before leaving on her trip.  [name] Provided disease specific education to patient. Education on a heart healthy diet. The patient verbalized she is eating well. 12-23-2019: The patient states she has gained weight because  she has been eating a lot. Talked about now that she doesn't have the caregiver responsibility that she needs to focus on her health and well being. Eating habits are still not good but the patient is going to go on vacation and then she will work on everything else. Education on watching sodium and fats in her diet. The patient verbalized understanding.  Nash Dimmer with appropriate clinical care team members regarding patient needs.  The patient is working with pharmacist and LCSW.  [name] Text sent to the patient to call the medication management clinic at [phone] to see if she can fax the paperwork to the office for assistance with medication needs.   Patient Self Care Activities related to HTN, HLD, and COPD:  . Patient is unable to independently self-manage chronic health conditions  Please see past updates related to this goal by  clicking on the "Past Updates" button in the selected goal          Telephone follow up appointment with care management team member scheduled for: 02-17-2020 at 230 pm  [place], MSN, [facility] Mobile: [phone]

## 2019-12-30 ENCOUNTER — Telehealth: Payer: Self-pay | Admitting: Pharmacist

## 2019-12-30 NOTE — Chronic Care Management (AMB) (Deleted)
   Chronic Care Management Pharmacy  Name: Stefanie Stewart  MRN: [id number] DOB: [birth year]   Chief Complaint/ HPI  Stefanie Stewart,  44 y.o. , female presents for their Follow-Up CCM visit with the clinical pharmacist via telephone.  PCP : [name], NP Patient Care Team: [name], NP as PCP - General (Nurse Practitioner) Vanita Ingles, RN as Case Manager ([place]) Greg Cutter, LCSW as Social Worker (Licensed Clinical Social Worker)  Their chronic conditions include: Hypertension, Hyperlipidemia, COPD, Depression and Tobacco use    Allergies  Allergen Reactions  . Bactrim [Sulfamethoxazole-Trimethoprim] Itching    Itching (skin and throat). Denies SOB or throat closing.  . Banana Other (See Comments)    Reports lip swelling. Every now and then pt states she has nausea and vomiting.    Medications: Outpatient Encounter Medications as of 12/30/2019  Medication Sig Note  . acetaminophen (TYLENOL) 500 MG tablet Take 500 mg by mouth every 6 (six) hours as needed. Takes 2 QAM and 2 QPM   . buPROPion (WELLBUTRIN XL) 150 MG 24 hr tablet Take 1 tablet (150 mg total) by mouth daily.   . cholecalciferol (VITAMIN D3) 25 MCG (1000 UNIT) tablet Take 1,000 Units by mouth daily. 12/07/2019: Dose: 1000 Units  . Evolocumab (REPATHA SURECLICK) 409 MG/ML SOAJ Inject 140 mg into the skin every 14 (fourteen) days.   . hydrochlorothiazide (HYDRODIURIL) 25 MG tablet Take 1 tablet (25 mg total) by mouth daily as needed. (Patient not taking: Reported on 12/07/2019) 12/07/2019: Has not been taking in the past month. Been over 1 year since last dose. Was taking for fluid in left leg. Caused cramping.   . Melatonin 10 MG TABS Take 1 tablet by mouth at bedtime as needed. 12/07/2019: Taken 1 time in past month  . methocarbamol (ROBAXIN) 500 MG tablet Take 1 tablet (500 mg total) by mouth every 8 (eight) hours as needed for muscle spasms.   . norethindrone (AYGESTIN) 5 MG tablet Take 1  tablet by mouth once daily   . sertraline (ZOLOFT) 50 MG tablet Take 1.5 tablets (75 mg total) by mouth daily.    No facility-administered encounter medications on file as of 12/30/2019.    Wt Readings from Last 3 Encounters:  07/30/19 256 lb (116.1 kg)  01/26/19 264 lb (119.7 kg)  05/29/18 276 lb 1.6 oz (125.2 kg)    Current Diagnosis/Assessment:    Goals Addressed   None     {[facility] Diagnosis/[phone]}   Medication Management   Pt uses *** pharmacy for all medications Uses pill box? {Yes or If no, why not?:20788} Pt endorses ***% compliance  We discussed: {Pharmacy options:24294}  Plan  {US Pharmacy WJXB:14782}    Follow up: *** month phone visit  ***

## 2020-01-27 ENCOUNTER — Telehealth: Payer: Self-pay | Admitting: Licensed Clinical Social Worker

## 2020-01-27 ENCOUNTER — Telehealth: Payer: Self-pay

## 2020-01-27 NOTE — Telephone Encounter (Signed)
°  Chronic Care Management    Clinical Social Work General Follow Up Note  01/27/2020 Name: Stefanie Stewart MRN: [id number] DOB: 15-Jun-1975  Stefanie Stewart is a 44 y.o. year old female who is a primary care patient of [name], [name], NP. The CCM team was consulted for assistance with Grief Counseling.   Review of patient status, including review of consultants reports, relevant laboratory and other test results, and collaboration with appropriate care team members and the patient's provider was performed as part of comprehensive patient evaluation and provision of chronic care management services.    LCSW completed CCM outreach attempt today but was unable to reach patient successfully. A HIPPA compliant voice message was left encouraging patient to return call once available. LCSW will ask Scheduling Care Guide to reschedule CCM SW appointment with patient as well.  Outpatient Encounter Medications as of 01/27/2020  Medication Sig Note   acetaminophen (TYLENOL) 500 MG tablet Take 500 mg by mouth every 6 (six) hours as needed. Takes 2 QAM and 2 QPM    buPROPion (WELLBUTRIN XL) 150 MG 24 hr tablet Take 1 tablet (150 mg total) by mouth daily.    cholecalciferol (VITAMIN D3) 25 MCG (1000 UNIT) tablet Take 1,000 Units by mouth daily. 12/07/2019: Dose: 1000 Units   Evolocumab (REPATHA SURECLICK) 711 MG/ML SOAJ Inject 140 mg into the skin every 14 (fourteen) days.    hydrochlorothiazide (HYDRODIURIL) 25 MG tablet Take 1 tablet (25 mg total) by mouth daily as needed. (Patient not taking: Reported on 12/07/2019) 12/07/2019: Has not been taking in the past month. Been over 1 year since last dose. Was taking for fluid in left leg. Caused cramping.    Melatonin 10 MG TABS Take 1 tablet by mouth at bedtime as needed. 12/07/2019: Taken 1 time in past month   methocarbamol (ROBAXIN) 500 MG tablet Take 1 tablet (500 mg total) by mouth every 8 (eight) hours as needed for muscle spasms.    norethindrone  (AYGESTIN) 5 MG tablet Take 1 tablet by mouth once daily    sertraline (ZOLOFT) 50 MG tablet Take 1.5 tablets (75 mg total) by mouth daily.    No facility-administered encounter medications on file as of 01/27/2020.   Follow Up Plan: [place] will reach out to patient to reschedule appointment.   Eula Fried, BSW, MSW, [facility] Care Management [email or web address] Phone: [phone]

## 2020-01-29 NOTE — Telephone Encounter (Signed)
Pt has been r/s  

## 2020-02-07 ENCOUNTER — Other Ambulatory Visit: Payer: Self-pay | Admitting: Nurse Practitioner

## 2020-02-07 NOTE — Telephone Encounter (Signed)
Requested Prescriptions  Pending Prescriptions Disp Refills   norethindrone (AYGESTIN) 5 MG tablet [Pharmacy Med Name: Norethindrone Acetate 5 MG Oral Tablet] 90 tablet 0    Sig: Take 1 tablet by mouth once daily     OB/GYN: Contraceptives - Progestins Passed - 02/07/2020  5:48 PM      Passed - Valid encounter within last 12 months    Recent Outpatient Visits          6 months ago Current moderate episode of major depressive disorder without prior episode ([place])   [place] [name], Jolene T, NP   8 months ago Current moderate episode of major depressive disorder without prior episode ([place])   [place] [name], Jolene T, NP   10 months ago Current moderate episode of major depressive disorder without prior episode ([place])   [place], Jolene T, NP   1 year ago Current moderate episode of major depressive disorder without prior episode ([place])   [place], Jolene T, NP   1 year ago Current moderate episode of major depressive disorder without prior episode ([place])   [place], [name], NP      Future Appointments            In 4 weeks [name], [name], NP [employer], [facility]

## 2020-02-17 ENCOUNTER — Telehealth: Payer: Self-pay

## 2020-03-07 ENCOUNTER — Ambulatory Visit: Payer: Self-pay | Admitting: Nurse Practitioner

## 2020-03-14 ENCOUNTER — Telehealth: Payer: Self-pay | Admitting: Licensed Clinical Social Worker

## 2020-03-14 ENCOUNTER — Telehealth: Payer: Self-pay

## 2020-03-14 NOTE — Telephone Encounter (Signed)
  Chronic Care Management    Clinical Social Work General Follow Up Note  03/14/2020 Name: Stefanie Stewart MRN: [id number] DOB: [birth year]  Stefanie Stewart is a 44 y.o. year old female who is a primary care patient of [name], [name], NP. The CCM team was consulted for assistance with [employer] .   Review of patient status, including review of consultants reports, relevant laboratory and other test results, and collaboration with appropriate care team members and the patient's provider was performed as part of comprehensive patient evaluation and provision of chronic care management services.    LCSW completed second CCM outreach attempt today but was unable to reach patient successfully. A HIPPA compliant voice message was left encouraging patient to return call once available. LCSW will ask Scheduling Care Guide to reschedule CCM SW appointment with patient as well.  Outpatient Encounter Medications as of 03/14/2020  Medication Sig Note  . acetaminophen (TYLENOL) 500 MG tablet Take 500 mg by mouth every 6 (six) hours as needed. Takes 2 QAM and 2 QPM   . buPROPion (WELLBUTRIN XL) 150 MG 24 hr tablet Take 1 tablet (150 mg total) by mouth daily.   . cholecalciferol (VITAMIN D3) 25 MCG (1000 UNIT) tablet Take 1,000 Units by mouth daily. 12/07/2019: Dose: 1000 Units  . Evolocumab (REPATHA SURECLICK) 016 MG/ML SOAJ Inject 140 mg into the skin every 14 (fourteen) days.   . hydrochlorothiazide (HYDRODIURIL) 25 MG tablet Take 1 tablet (25 mg total) by mouth daily as needed. (Patient not taking: Reported on 12/07/2019) 12/07/2019: Has not been taking in the past month. Been over 1 year since last dose. Was taking for fluid in left leg. Caused cramping.   . Melatonin 10 MG TABS Take 1 tablet by mouth at bedtime as needed. 12/07/2019: Taken 1 time in past month  . methocarbamol (ROBAXIN) 500 MG tablet Take 1 tablet (500 mg total) by mouth every 8 (eight) hours as needed for muscle spasms.   .  norethindrone (AYGESTIN) 5 MG tablet Take 1 tablet by mouth once daily   . sertraline (ZOLOFT) 50 MG tablet Take 1.5 tablets (75 mg total) by mouth daily.    No facility-administered encounter medications on file as of 03/14/2020.    Follow Up Plan: [place] will reach out to patient to reschedule appointment.   Eula Fried, BSW, MSW, [facility] Care Management [email or web address] Phone: [phone]

## 2020-03-15 ENCOUNTER — Telehealth: Payer: Self-pay | Admitting: Licensed Clinical Social Worker

## 2020-03-15 NOTE — Telephone Encounter (Signed)
  Chronic Care Management    Clinical Social Work General Follow Up Note  03/15/2020 Name: Stefanie Stewart MRN: [id number] DOB: 04-Mar-1976  Stefanie Stewart is a 44 y.o. year old female who is a primary care patient of [name], [name], NP. The CCM team was consulted for assistance with Caregiver Stress.   Review of patient status, including review of consultants reports, relevant laboratory and other test results, and collaboration with appropriate care team members and the patient's provider was performed as part of comprehensive patient evaluation and provision of chronic care management services.    LCSW completed CCM outreach attempt today but was unable to reach patient successfully. A HIPPA compliant voice message was left encouraging patient to return call once available. LCSW will ask Scheduling Care Guide to reschedule CCM SW appointment with patient as well.  Outpatient Encounter Medications as of 03/15/2020  Medication Sig Note  . acetaminophen (TYLENOL) 500 MG tablet Take 500 mg by mouth every 6 (six) hours as needed. Takes 2 QAM and 2 QPM   . buPROPion (WELLBUTRIN XL) 150 MG 24 hr tablet Take 1 tablet (150 mg total) by mouth daily.   . cholecalciferol (VITAMIN D3) 25 MCG (1000 UNIT) tablet Take 1,000 Units by mouth daily. 12/07/2019: Dose: 1000 Units  . Evolocumab (REPATHA SURECLICK) 681 MG/ML SOAJ Inject 140 mg into the skin every 14 (fourteen) days.   . hydrochlorothiazide (HYDRODIURIL) 25 MG tablet Take 1 tablet (25 mg total) by mouth daily as needed. (Patient not taking: Reported on 12/07/2019) 12/07/2019: Has not been taking in the past month. Been over 1 year since last dose. Was taking for fluid in left leg. Caused cramping.   . Melatonin 10 MG TABS Take 1 tablet by mouth at bedtime as needed. 12/07/2019: Taken 1 time in past month  . methocarbamol (ROBAXIN) 500 MG tablet Take 1 tablet (500 mg total) by mouth every 8 (eight) hours as needed for muscle spasms.   . norethindrone  (AYGESTIN) 5 MG tablet Take 1 tablet by mouth once daily   . sertraline (ZOLOFT) 50 MG tablet Take 1.5 tablets (75 mg total) by mouth daily.    No facility-administered encounter medications on file as of 03/15/2020.    Follow Up Plan: [place] will reach out to patient to reschedule appointment.   Eula Fried, BSW, MSW, [facility] Care Management [email or web address] Phone: [phone]

## 2020-03-23 NOTE — Telephone Encounter (Signed)
Patient has been rescheduled.

## 2020-03-24 ENCOUNTER — Other Ambulatory Visit: Payer: Self-pay

## 2020-03-24 ENCOUNTER — Encounter: Payer: Self-pay | Admitting: Nurse Practitioner

## 2020-03-24 ENCOUNTER — Ambulatory Visit (INDEPENDENT_AMBULATORY_CARE_PROVIDER_SITE_OTHER): Payer: Self-pay | Admitting: Nurse Practitioner

## 2020-03-24 DIAGNOSIS — E1129 Type 2 diabetes mellitus with other diabetic kidney complication: Secondary | ICD-10-CM

## 2020-03-24 DIAGNOSIS — I152 Hypertension secondary to endocrine disorders: Secondary | ICD-10-CM

## 2020-03-24 DIAGNOSIS — E785 Hyperlipidemia, unspecified: Secondary | ICD-10-CM

## 2020-03-24 DIAGNOSIS — E1169 Type 2 diabetes mellitus with other specified complication: Secondary | ICD-10-CM

## 2020-03-24 DIAGNOSIS — R7401 Elevation of levels of liver transaminase levels: Secondary | ICD-10-CM

## 2020-03-24 DIAGNOSIS — T466X5A Adverse effect of antihyperlipidemic and antiarteriosclerotic drugs, initial encounter: Secondary | ICD-10-CM

## 2020-03-24 DIAGNOSIS — F321 Major depressive disorder, single episode, moderate: Secondary | ICD-10-CM

## 2020-03-24 DIAGNOSIS — M791 Myalgia, unspecified site: Secondary | ICD-10-CM

## 2020-03-24 DIAGNOSIS — E1159 Type 2 diabetes mellitus with other circulatory complications: Secondary | ICD-10-CM

## 2020-03-24 DIAGNOSIS — M25512 Pain in left shoulder: Secondary | ICD-10-CM | POA: Insufficient documentation

## 2020-03-24 DIAGNOSIS — R809 Proteinuria, unspecified: Secondary | ICD-10-CM

## 2020-03-24 DIAGNOSIS — F17210 Nicotine dependence, cigarettes, uncomplicated: Secondary | ICD-10-CM

## 2020-03-24 HISTORY — DX: Type 2 diabetes mellitus with other specified complication: E11.69

## 2020-03-24 HISTORY — DX: Morbid (severe) obesity due to excess calories: E66.01

## 2020-03-24 LAB — BAYER DCA HB A1C WAIVED: HB A1C (BAYER DCA - WAIVED): 11.3 % — ABNORMAL HIGH (ref <7.0)

## 2020-03-24 LAB — MICROALBUMIN, URINE WAIVED
Creatinine, Urine Waived: 200 mg/dL (ref 10–300)
Microalb, Ur Waived: 150 mg/L — ABNORMAL HIGH (ref 0–19)
Microalb/Creat Ratio: >300 mg/g — ABNORMAL HIGH (ref <30)

## 2020-03-24 MED ORDER — BLOOD GLUCOSE MONITOR KIT: PACK | 0 refills | Status: AC

## 2020-03-24 MED ORDER — METFORMIN HCL 500 MG PO TABS: ORAL_TABLET | ORAL | 3 refills | Status: DC

## 2020-03-24 MED ORDER — LOSARTAN POTASSIUM 25 MG PO TABS
25.0000 mg | ORAL_TABLET | Freq: Every day | ORAL | 4 refills | Status: DC
Start: 2020-03-24 — End: 2020-05-09

## 2020-03-24 NOTE — Assessment & Plan Note (Signed)
Chronic, stable with BP above goal in office today and new diagnosis of T2DM with urine ALB 150.  At this time recommend starting Losartan 25 MG daily and then adjust in future as needed.  Discussed benefit of ARB for kidney protection with proteinuria.  Recommend she monitor BP at least a few mornings a week at home and document.  DASH diet at home.  Labs today.  Return in 3 months.

## 2020-03-24 NOTE — Assessment & Plan Note (Signed)
New diagnosis T2DM with A1c 11.3 today and urine ALB 150.  At this time recommend starting Losartan 25 MG daily and then adjust in future as needed.  Discussed benefit of ARB for kidney protection with proteinuria.  Labs today.  Return in 3 months.

## 2020-03-24 NOTE — Assessment & Plan Note (Signed)
I have recommended complete cessation of tobacco use. I have discussed various options available for assistance with tobacco cessation including over the counter methods (Nicotine gum, patch and lozenges). We also discussed prescription options (Chantix, Nicotine Inhaler / Nasal Spray). The patient is not interested in pursuing any prescription tobacco cessation options at this time.  Determine whether to start lung CT screening annually at age 44.  

## 2020-03-24 NOTE — Assessment & Plan Note (Signed)
History of myalgia, even with 3 day a week Crestor.  At this time continue Repatha, which she is tolerating well, for lipid lowering and stroke prevention.

## 2020-03-24 NOTE — Assessment & Plan Note (Signed)
Acute with waxing and waning over past months. Normal exam today with no weakness and full ROM present.  Recommend continue OTC medication regimen, including Voltaren gel and Icy/Hot as needed.  Continue massage therapy and alternating heat and ice as needed.  Return to office for worsening or ongoing pain, could consider imaging if ongoing and possible PT or ortho referral.

## 2020-03-24 NOTE — Assessment & Plan Note (Signed)
Recheck CMP today.  Consider imaging if elevations continue. 

## 2020-03-24 NOTE — Assessment & Plan Note (Signed)
Chronic, ongoing.  Continue current medication regimen and adjust as needed.  Denies SI/HI. Return to office in 3 months. 

## 2020-03-24 NOTE — Assessment & Plan Note (Signed)
Current BMI 38.03 with T2DM and HTN.  She has lost 20 pounds over past year.  Recommended eating smaller high protein, low fat meals more frequently and exercising 30 mins a day 5 times a week with a goal of 10-15lb weight loss in the next 3 months. Patient voiced their understanding and motivation to adhere to these recommendations.

## 2020-03-24 NOTE — Assessment & Plan Note (Signed)
Chronic, ongoing.  History of poor tolerance statin.  Continue Repatha at this time, which she is tolerating.  Lipid panel today.  Return in 3 months. 

## 2020-03-24 NOTE — Assessment & Plan Note (Signed)
New diagnosis with A1c 11.3 today.  Urine ALB 150.  Educated her at length on diabetes and new diagnosis.  Send with script for glucometer.  Will start Metformin and educated her on how to slowly increase dose on this, educated her on side effects, including GI effects.  Start Losartan 25 MG daily for kidney protection and continue Repatha for cholesterol reduction.  Will place referral to diabetes education and introduced her to American Diabetes Association web site.  Educated on blood sugar goals, less then 130 fasting in morning and <180 two hours after a meal.  Will have her return in 4 weeks for follow-up.  If poor tolerance of Metformin could consider a GLP1, which would also benefit weight loss.

## 2020-03-24 NOTE — Patient Instructions (Signed)

## 2020-03-24 NOTE — Progress Notes (Signed)
BP (!) 144/82    Pulse 96    Temp 98.4 F (36.9 C) (Oral)    Ht 5' 7.76" (1.721 m)    Wt 248 lb 4.8 oz (112.6 kg)    SpO2 97%    BMI 38.03 kg/m    Subjective:    Patient ID: Stefanie Stewart, female    DOB: 08/04/1975, 44 y.o.   MRN: [id number]  HPI: Stefanie Stewart is a 44 y.o. female  Chief Complaint  Patient presents with   Medication Review    Follow up on Chol. Med. With blood work   HYPERTENSION / HYPERLIPIDEMIA Currently no blood pressure medications -- was originally given HCTZ due to edema, she is taking Repatha for HLD -- as did not tolerate statins, even on 3 day a week schedule.  She has lost 20 pounds since last November.  On last labs did note glucose of 294, non fasting -- discussed checking A1c today.  She does continue to smoke cigarettes daily. Satisfied with current treatment? yes Duration of hypertension: chronic BP monitoring frequency: not checking BP range:  BP medication side effects: no Duration of hyperlipidemia: chronic Cholesterol medication side effects: no Cholesterol supplements: none Medication compliance: fair compliance - at times forgets Aspirin: no Recent stressors: no Recurrent headaches: no Visual changes: no Palpitations: no Dyspnea: no Chest pain: no Lower extremity edema: no Dizzy/lightheaded: no  The 10-year ASCVD risk score Mikey Bussing DC Jr., et al., 2013) is: 19.1%   Values used to calculate the score:     Age: 31 years     Sex: Female     Is Non-Hispanic African American: No     Diabetic: Yes     Tobacco smoker: No     Systolic Blood Pressure: 123456 mmHg     Is BP treated: Yes     HDL Cholesterol: 23 mg/dL     Total Cholesterol: 299 mg/dL   SHOULDER PAIN Reports ongoing left shoulder pain for several months with lifting arm up.  Her muscle is very tight through there.  Fluctuates in discomfort. Duration: months Involved shoulder: left Mechanism of injury: unknown Location: posterior Onset:gradual Severity: 10/10 at  worst Quality: aching and dull Frequency:intermittent Radiation: no Aggravating factors: movement  Alleviating factors: massage, muscle relaxer Status: stable Treatments attempted: muscle relaxer, Tylenol, Ibuprofen, OTC creams, massage therapy, chiropractor Relief with NSAIDs?:  moderate Weakness: no Numbness: no Decreased grip strength: no Redness: no Swelling: no Bruising: no Fevers: no  DEPRESSION Currently taking Zoloft 75 MG and Wellbutrin 150 MG XL daily. Had caregiver role strain, but since last visit her mother has passed away. Currently dealing with the grieving due to not having any parents now, she did buy her mother's house after she passed and is living on own there.   Mood status: stable Satisfied with current treatment?: yes Symptom severity: mild  Duration of current treatment : chronic Side effects: no Medication compliance: fair compliance -- at times forgets Psychotherapy/counseling: none Depressed mood: yes Anxious mood: yes Anhedonia: no Significant weight loss or gain: no Insomnia: yes hard to fall asleep Fatigue: no Feelings of worthlessness or guilt: no Impaired concentration/indecisiveness: no Suicidal ideations: no Hopelessness: no Crying spells: no Depression screen [facility] 2/9 03/24/2020 08/04/2019 07/31/2019 05/18/2019 04/06/2019  Decreased Interest 3 1 1 1  0  Down, Depressed, Hopeless 3 0 0 1 1  PHQ - 2 Score 6 1 1 2 1   Altered sleeping 0 2 2 1  0  Tired, decreased energy 3 0 0  1 0  Change in appetite 1 3 3 3 3   Feeling bad or failure about yourself  1 1 1  0 0  Trouble concentrating 2 0 0 0 3  Moving slowly or fidgety/restless 0 0 0 0 0  Suicidal thoughts 0 0 0 0 0  PHQ-9 Score 13 7 7 7 7   Difficult doing work/chores Very difficult Somewhat difficult Somewhat difficult Not difficult at all -  Some recent data might be hidden    Relevant past medical, surgical, family and social history reviewed and updated as indicated. Interim medical history  since our last visit reviewed. Allergies and medications reviewed and updated.  Review of Systems  Constitutional: Negative for activity change, appetite change, diaphoresis, fatigue and fever.  Respiratory: Negative for cough, chest tightness and shortness of breath.   Cardiovascular: Negative for chest pain, palpitations and leg swelling.  Gastrointestinal: Negative.   Endocrine: Negative for polydipsia, polyphagia and polyuria.  Musculoskeletal: Positive for arthralgias.  Neurological: Negative.   Psychiatric/Behavioral: Negative.     Per HPI unless specifically indicated above     Objective:    BP (!) 144/82    Pulse 96    Temp 98.4 F (36.9 C) (Oral)    Ht 5' 7.76" (1.721 m)    Wt 248 lb 4.8 oz (112.6 kg)    SpO2 97%    BMI 38.03 kg/m   Wt Readings from Last 3 Encounters:  03/24/20 248 lb 4.8 oz (112.6 kg)  07/30/19 256 lb (116.1 kg)  01/26/19 264 lb (119.7 kg)    Physical Exam Vitals and nursing note reviewed.  Constitutional:      General: She is awake. She is not in acute distress.    Appearance: She is well-developed and well-groomed. She is obese. She is not ill-appearing.  HENT:     Head: Normocephalic.     Right Ear: Hearing normal.     Left Ear: Hearing normal.  Eyes:     General: Lids are normal.        Right eye: No discharge.        Left eye: No discharge.     Conjunctiva/sclera: Conjunctivae normal.     Pupils: Pupils are equal, round, and reactive to light.  Neck:     Thyroid: No thyromegaly.     Vascular: No carotid bruit.  Cardiovascular:     Rate and Rhythm: Normal rate and regular rhythm.     Heart sounds: Normal heart sounds. No murmur heard. No gallop.   Pulmonary:     Effort: Pulmonary effort is normal. No accessory muscle usage or respiratory distress.     Breath sounds: Normal breath sounds.  Abdominal:     General: Bowel sounds are normal.     Palpations: Abdomen is soft. There is no hepatomegaly or splenomegaly.  Musculoskeletal:      Right shoulder: Normal.     Left shoulder: No swelling, effusion, laceration, tenderness, bony tenderness or crepitus. Normal range of motion. Normal strength.     Cervical back: Normal range of motion and neck supple.     Right lower leg: No edema.     Left lower leg: No edema.  Lymphadenopathy:     Cervical: No cervical adenopathy.  Skin:    General: Skin is warm and dry.  Neurological:     Mental Status: She is alert and oriented to person, place, and time.  Psychiatric:        Attention and Perception: Attention normal.  Mood and Affect: Mood normal.        Speech: Speech normal.        Behavior: Behavior normal. Behavior is cooperative.        Thought Content: Thought content normal.     Diabetic Foot Exam - Simple   Simple Foot Form Visual Inspection No deformities, no ulcerations, no other skin breakdown bilaterally: Yes Sensation Testing Intact to touch and monofilament testing bilaterally: Yes Pulse Check Posterior Tibialis and Dorsalis pulse intact bilaterally: Yes Comments     Results for orders placed or performed in visit on 03/24/20  Bayer DCA Hb A1c Waived  Result Value Ref Range   HB A1C (BAYER DCA - WAIVED) 11.3 (H) <7.0 %  Microalbumin, Urine Waived  Result Value Ref Range   Microalb, Ur Waived 150 (H) 0 - 19 mg/L   Creatinine, Urine Waived 200 10 - 300 mg/dL   Microalb/Creat Ratio >300 (H) <30 mg/g      Assessment & Plan:   Problem List Items Addressed This Visit      Cardiovascular and Mediastinum   Hypertension associated with diabetes (HCC) (Chronic)    Chronic, stable with BP above goal in office today and new diagnosis of T2DM with urine ALB 150.  At this time recommend starting Losartan 25 MG daily and then adjust in future as needed.  Discussed benefit of ARB for kidney protection with proteinuria.  Recommend she monitor BP at least a few mornings a week at home and document.  DASH diet at home.  Labs today.  Return in 3 months.        Relevant Medications   metFORMIN (GLUCOPHAGE) 500 MG tablet   losartan (COZAAR) 25 MG tablet     Endocrine   Hyperlipidemia associated with type 2 diabetes mellitus (HCC) (Chronic)    Chronic, ongoing.  History of poor tolerance statin.  Continue Repatha at this time, which she is tolerating.  Lipid panel today.  Return in 3 months.      Relevant Medications   metFORMIN (GLUCOPHAGE) 500 MG tablet   losartan (COZAAR) 25 MG tablet   Other Relevant Orders   Comprehensive metabolic panel   Lipid Panel w/o Chol/HDL Ratio   Type 2 diabetes mellitus with morbid obesity (HCC) - Primary (Chronic)    New diagnosis with A1c 11.3 today.  Urine ALB 150.  Educated her at length on diabetes and new diagnosis.  Send with script for glucometer.  Will start Metformin and educated her on how to slowly increase dose on this, educated her on side effects, including GI effects.  Start Losartan 25 MG daily for kidney protection and continue Repatha for cholesterol reduction.  Will place referral to diabetes education and introduced her to American Diabetes Association web site.  Educated on blood sugar goals, less then 130 fasting in morning and <180 two hours after a meal.  Will have her return in 4 weeks for follow-up.  If poor tolerance of Metformin could consider a GLP1, which would also benefit weight loss.      Relevant Medications   metFORMIN (GLUCOPHAGE) 500 MG tablet   losartan (COZAAR) 25 MG tablet   Other Relevant Orders   Bayer DCA Hb A1c Waived (Completed)   Microalbumin, Urine Waived (Completed)   Type 2 diabetes mellitus with proteinuria (HCC) (Chronic)    New diagnosis T2DM with A1c 11.3 today and urine ALB 150.  At this time recommend starting Losartan 25 MG daily and then adjust in future as needed.  Discussed benefit of ARB for kidney protection with proteinuria.  Labs today.  Return in 3 months.      Relevant Medications   metFORMIN (GLUCOPHAGE) 500 MG tablet   losartan (COZAAR) 25 MG  tablet     Other   Depression (Chronic)    Chronic, ongoing.  Continue current medication regimen and adjust as needed.  Denies SI/HI. Return to office in 3 months.      Morbid obesity ([place])    Current BMI 38.03 with T2DM and HTN.  She has lost 20 pounds over past year.  Recommended eating smaller high protein, low fat meals more frequently and exercising 30 mins a day 5 times a week with a goal of 10-15lb weight loss in the next 3 months. Patient voiced their understanding and motivation to adhere to these recommendations.       Relevant Medications   metFORMIN (GLUCOPHAGE) 500 MG tablet   Nicotine dependence, cigarettes, uncomplicated    I have recommended complete cessation of tobacco use. I have discussed various options available for assistance with tobacco cessation including over the counter methods (Nicotine gum, patch and lozenges). We also discussed prescription options (Chantix, Nicotine Inhaler / Nasal Spray). The patient is not interested in pursuing any prescription tobacco cessation options at this time.  Determine whether to start lung CT screening annually at age 52.       Myalgia due to statin    History of myalgia, even with 3 day a week Crestor.  At this time continue Repatha, which she is tolerating well, for lipid lowering and stroke prevention.      Elevated ALT measurement    Recheck CMP today.  Consider imaging if elevations continue.      Acute pain of left shoulder    Acute with waxing and waning over past months. Normal exam today with no weakness and full ROM present.  Recommend continue OTC medication regimen, including Voltaren gel and Icy/Hot as needed.  Continue massage therapy and alternating heat and ice as needed.  Return to office for worsening or ongoing pain, could consider imaging if ongoing and possible PT or ortho referral.          Follow up plan: Return in about 4 weeks (around 04/21/2020) for T2DM.

## 2020-03-25 LAB — LIPID PANEL W/O CHOL/HDL RATIO
Cholesterol, Total: 223 mg/dL — ABNORMAL HIGH (ref 100–199)
HDL: 25 mg/dL — ABNORMAL LOW (ref >39)
LDL Chol Calc (NIH): 79 mg/dL (ref 0–99)
Triglycerides: 744 mg/dL (ref 0–149)
VLDL Cholesterol Cal: 119 mg/dL — ABNORMAL HIGH (ref 5–40)

## 2020-03-25 LAB — COMPREHENSIVE METABOLIC PANEL
ALT: 62 IU/L — ABNORMAL HIGH (ref 0–32)
AST: 37 IU/L (ref 0–40)
Albumin/Globulin Ratio: 1.7 (ref 1.2–2.2)
Albumin: 4.8 g/dL (ref 3.8–4.8)
Alkaline Phosphatase: 67 IU/L (ref 44–121)
BUN/Creatinine Ratio: 23 (ref 9–23)
BUN: 15 mg/dL (ref 6–24)
Bilirubin Total: 0.9 mg/dL (ref 0.0–1.2)
CO2: 21 mmol/L (ref 20–29)
Calcium: 9.7 mg/dL (ref 8.7–10.2)
Chloride: 97 mmol/L (ref 96–106)
Creatinine, Ser: 0.64 mg/dL (ref 0.57–1.00)
GFR calc Af Amer: 124 mL/min/{1.73_m2} (ref >59)
GFR calc non Af Amer: 107 mL/min/{1.73_m2} (ref >59)
Globulin, Total: 2.9 g/dL (ref 1.5–4.5)
Glucose: 290 mg/dL — ABNORMAL HIGH (ref 65–99)
Potassium: 4.5 mmol/L (ref 3.5–5.2)
Sodium: 134 mmol/L (ref 134–144)
Total Protein: 7.7 g/dL (ref 6.0–8.5)

## 2020-03-26 NOTE — Progress Notes (Signed)
Contacted via MyChart   Good morning Stefanie Stewart, your labs have returned.  The good news is your LDL, bad cholesterol level, has decreased from 173 to now 79 with Repatha -- we like less than 70 for stroke prevention so you are much improved.  Great news!!  Your triglycerides are still quite elevated, were you fasting?  If you were I would like to add on another medication with your Repatha called Fenofibrate, this helps lower these levels.  If you were not fasting, I would like to recheck labs with you fasting one morning, could do outpatient lab visit only.  Kidney function remains stable.  Glucose, sugar level, was elevated as expected at 290.  Liver function tests show ALT with mild elevation at 62, but AST normal.  We will continue to monitor these.  Any questions? Keep being awesome!!  Thank you for allowing me to participate in your care. Kindest regards, Jodell Weitman

## 2020-03-27 ENCOUNTER — Other Ambulatory Visit: Payer: Self-pay | Admitting: Nurse Practitioner

## 2020-03-27 DIAGNOSIS — Z1211 Encounter for screening for malignant neoplasm of colon: Secondary | ICD-10-CM

## 2020-03-28 ENCOUNTER — Telehealth: Payer: Self-pay

## 2020-03-28 NOTE — Telephone Encounter (Signed)
Pt stated she would like referral placed in [place] as that is more convenient for her.

## 2020-03-30 ENCOUNTER — Telehealth: Payer: Self-pay

## 2020-03-30 ENCOUNTER — Other Ambulatory Visit: Payer: Self-pay | Admitting: Nurse Practitioner

## 2020-03-30 ENCOUNTER — Telehealth: Payer: Self-pay | Admitting: Licensed Clinical Social Worker

## 2020-03-30 MED ORDER — EMPAGLIFLOZIN 25 MG PO TABS: 25.0000 mg | ORAL_TABLET | Freq: Every day | ORAL | 5 refills | Status: DC

## 2020-03-30 NOTE — Telephone Encounter (Signed)
  Chronic Care Management    Clinical Social Work General Follow Up Note  03/30/2020 Name: Stefanie Stewart MRN: [id number] DOB: 04-Aug-1975  Stefanie Stewart is a 45 y.o. year old female who is a primary care patient of [name], [name], NP. The CCM team was consulted for assistance with Mental Health Counseling and Resources.   Review of patient status, including review of consultants reports, relevant laboratory and other test results, and collaboration with appropriate care team members and the patient's provider was performed as part of comprehensive patient evaluation and provision of chronic care management services.    LCSW completed CCM outreach attempt today but was unable to reach patient successfully. A HIPPA compliant voice message was left encouraging patient to return call once available. LCSW will ask Scheduling Care Guide to reschedule CCM SW appointment with patient as well.  Outpatient Encounter Medications as of 03/30/2020  Medication Sig Note  . acetaminophen (TYLENOL) 500 MG tablet Take 500 mg by mouth every 6 (six) hours as needed. Takes 2 QAM and 2 QPM   . blood glucose meter kit and supplies KIT Dispense based on patient and insurance preference. Use up to four times daily as directed. (FOR ICD-9 250.00, 250.01).   [name] buPROPion (WELLBUTRIN XL) 150 MG 24 hr tablet Take 1 tablet (150 mg total) by mouth daily.   . cholecalciferol (VITAMIN D3) 25 MCG (1000 UNIT) tablet Take 1,000 Units by mouth daily. 12/07/2019: Dose: 1000 Units  . Evolocumab (REPATHA SURECLICK) 818 MG/ML SOAJ Inject 140 mg into the skin every 14 (fourteen) days.   [name] losartan (COZAAR) 25 MG tablet Take 1 tablet (25 mg total) by mouth daily.   . Melatonin 10 MG TABS Take 1 tablet by mouth at bedtime as needed. 12/07/2019: Taken 1 time in past month  . metFORMIN (GLUCOPHAGE) 500 MG tablet Start out taking 500 MG (one tablet) by mouth once daily for one week, then on week two take 500 MG (one tablet) by mouth twice a  day, then on week three start taking 1000 MG (two tablets) by mouth in morning and 500 MG (one tablet) in evening, then on week four starting taking 1000 MG (two tablets) twice a day.   . methocarbamol (ROBAXIN) 500 MG tablet Take 1 tablet (500 mg total) by mouth every 8 (eight) hours as needed for muscle spasms.   . norethindrone (AYGESTIN) 5 MG tablet Take 1 tablet by mouth once daily   . sertraline (ZOLOFT) 50 MG tablet Take 1.5 tablets (75 mg total) by mouth daily.    No facility-administered encounter medications on file as of 03/30/2020.    Follow Up Plan: [name] will reach out to patient to reschedule appointment.   Eula Fried, BSW, MSW, [facility] [email or web address] Phone: [phone]

## 2020-03-30 NOTE — Progress Notes (Signed)
Major side effects with Metformin, will send in [place] or [place], discussed with patient.

## 2020-03-31 ENCOUNTER — Telehealth: Payer: Self-pay | Admitting: Pharmacist

## 2020-03-31 NOTE — Telephone Encounter (Signed)
03/31/2020 10:35:18 AM - [place] [facility] New Med to pat & dr -- Rhetta Mura - Thursday, March 31, 2020 10:33 AM -- Received pharmacy printout for new med-Jardiance 25mg  Take 1 tablet by mouth every day-printed [employer] application-mailing provider their portion to sign & return, also mailing patient her portion and letter for medication to be shipped to our office to sign & return.

## 2020-04-04 ENCOUNTER — Encounter: Payer: Self-pay | Admitting: Nurse Practitioner

## 2020-04-04 ENCOUNTER — Encounter: Payer: No Typology Code available for payment source | Attending: Nurse Practitioner | Admitting: Dietician

## 2020-04-04 ENCOUNTER — Encounter: Payer: Self-pay | Admitting: Dietician

## 2020-04-04 ENCOUNTER — Other Ambulatory Visit: Payer: Self-pay

## 2020-04-04 DIAGNOSIS — E1159 Type 2 diabetes mellitus with other circulatory complications: Secondary | ICD-10-CM | POA: Insufficient documentation

## 2020-04-04 DIAGNOSIS — Z6837 Body mass index (BMI) 37.0-37.9, adult: Secondary | ICD-10-CM | POA: Insufficient documentation

## 2020-04-04 DIAGNOSIS — R809 Proteinuria, unspecified: Secondary | ICD-10-CM | POA: Insufficient documentation

## 2020-04-04 DIAGNOSIS — E785 Hyperlipidemia, unspecified: Secondary | ICD-10-CM | POA: Insufficient documentation

## 2020-04-04 DIAGNOSIS — I152 Hypertension secondary to endocrine disorders: Secondary | ICD-10-CM | POA: Insufficient documentation

## 2020-04-04 DIAGNOSIS — E1169 Type 2 diabetes mellitus with other specified complication: Secondary | ICD-10-CM | POA: Insufficient documentation

## 2020-04-04 NOTE — Patient Instructions (Addendum)
Work towards eating three meals a day, about 5-6 hours apart!  Begin to recognize carbohydrates in your food choices!  Have 3 carb choices at each meal (45 g).   Begin to build your meals using the proportions of the Balanced Plate. . First, select your carb choice(s) for the meal, and determine how much you should have to equal 3 carb choices (45 g). . Next, select your source of protein to pair with your carb choice(s). . Finally, complete the remaining half of your meal with a variety of non-starchy vegetables.   Go to Diabetes.org for great recipes

## 2020-04-04 NOTE — Progress Notes (Signed)
Diabetes Self-Management Education  Visit Type: First/Initial  Appt. Start Time: 1545 Appt. End Time: 1700  04/04/2020  Stefanie Stewart, identified by name and date of birth, is a 45 y.o. female with a diagnosis of Diabetes: Type 2.   ASSESSMENT Pt wants to know what to buy in the store, what to eat. 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 Pt reports liking to snack. Pt states she loves ranch dressing. Pt reports avoiding potatoes, fast food. Pt reports nausea and diarrhea from metformin, was taking during her meals.  Pt is a Art therapist. Pt reports only eating fish and vegetables since her diagnosis. Pt reports fear of the complications of diabetes. Pt states her dog is a diabetic.  Pt reports trying to read food labels.  Height 5\' 8"  (1.727 m), weight 246 lb 3.2 oz (111.7 kg). Body mass index is 37.43 kg/m.   Diabetes Self-Management Education - 04/04/20 1603      Visit Information   Visit Type First/Initial      Initial Visit   Diabetes Type Type 2    Are you currently following a meal plan? No    Are you taking your medications as prescribed? No    Date Diagnosed 03/29/2020      Health Coping   How would you rate your overall health? Fair      Psychosocial Assessment   Patient Belief/Attitude about Diabetes Afraid    Self-care barriers None    Other persons present Patient    Patient Concerns Nutrition/Meal planning;Healthy Lifestyle    Special Needs None    Preferred Learning Style No preference indicated    How often do you need to have someone help you when you read instructions, pamphlets, or other written materials from your doctor or pharmacy? 1 - Never    What is the last grade level you completed in school? College      Pre-Education Assessment   Patient understands the diabetes disease and treatment process. Needs Instruction    Patient understands incorporating nutritional management into lifestyle. Needs  Instruction    Patient undertands incorporating physical activity into lifestyle. Needs Instruction    Patient understands using medications safely. Needs Instruction    Patient understands monitoring blood glucose, interpreting and using results Needs Instruction    Patient understands prevention, detection, and treatment of acute complications. Needs Instruction    Patient understands prevention, detection, and treatment of chronic complications. Needs Instruction    Patient understands how to develop strategies to address psychosocial issues. Needs Instruction    Patient understands how to develop strategies to promote health/change behavior. Needs Instruction      Complications   Last HgB A1C per patient/outside source 11.3 %    How often do you check your blood sugar? 1-2 times/day    Fasting Blood glucose range (mg/dL) >200    Postprandial Blood glucose range (mg/dL) --   pt does not check postprandial   Number of hypoglycemic episodes per month 0    Number of hyperglycemic episodes per week 0    Have you had a dilated eye exam in the past 12 months? Yes    Have you had a dental exam in the past 12 months? Yes    Are you checking your feet? No      Dietary Intake   Breakfast Low sodium V8, low fat cottage cheese    Snack (morning) none    Lunch 2 slices  whole wehat bread, mayonnaise, 2 thin slices swiss, 2 slices [place] breast    Snack (afternoon) none    Dinner [employer] Grilled chicken salad    Snack (evening) pistachio, water, bagel with cream cheese early in the morning    Beverage(s) 2-3 diet gingerale      Exercise   Exercise Type ADL's    How many days per week to you exercise? 0    How many minutes per day do you exercise? 0    Total minutes per week of exercise 0      Patient Education   Previous Diabetes Education No    Disease state  Definition of diabetes, type 1 and 2, and the diagnosis of diabetes    Nutrition management  Role of diet in the treatment of  diabetes and the relationship between the three main macronutrients and blood glucose level;Carbohydrate counting;Food label reading, portion sizes and measuring food.    Physical activity and exercise  Role of exercise on diabetes management, blood pressure control and cardiac health.    Medications Reviewed patients medication for diabetes, action, purpose, timing of dose and side effects.    Monitoring Purpose and frequency of SMBG.    Chronic complications Retinopathy and reason for yearly dilated eye exams;Relationship between chronic complications and blood glucose control;Assessed and discussed foot care and prevention of foot problems    Psychosocial adjustment Role of stress on diabetes    Personal strategies to promote health Lifestyle issues that need to be addressed for better diabetes care      Individualized Goals (developed by patient)   Nutrition Follow meal plan discussed    Medications take my medication as prescribed    Monitoring  test my blood glucose as discussed      Post-Education Assessment   Patient understands the diabetes disease and treatment process. Needs Review    Patient understands incorporating nutritional management into lifestyle. Needs Review    Patient undertands incorporating physical activity into lifestyle. Needs Review    Patient understands using medications safely. Needs Review    Patient understands monitoring blood glucose, interpreting and using results Needs Review    Patient understands prevention, detection, and treatment of acute complications. Needs Review    Patient understands prevention, detection, and treatment of chronic complications. Needs Review    Patient understands how to develop strategies to address psychosocial issues. Needs Review    Patient understands how to develop strategies to promote health/change behavior. Needs Review      Outcomes   Expected Outcomes Demonstrated limited interest in learning.  Expect minimal changes     Future DMSE 4-6 wks           Individualized Plan for Diabetes Self-Management Training:   Learning Objective:  Patient will have a greater understanding of diabetes self-management. Patient education plan is to attend individual and/or group sessions per assessed needs and concerns.   Plan:   Patient Instructions  Work towards eating three meals a day, about 5-6 hours apart!  Begin to recognize carbohydrates in your food choices!  Have 3 carb choices at each meal (45 g).   Begin to build your meals using the proportions of the Balanced Plate. . First, select your carb choice(s) for the meal, and determine how much you should have to equal 3 carb choices (45 g). . Next, select your source of protein to pair with your carb choice(s). . Finally, complete the remaining half of your meal with a variety of non-starchy  vegetables.   Go to Diabetes.org for great recipes    Expected Outcomes:  Demonstrated limited interest in learning.  Expect minimal changes  Education material provided: ADA - How to Thrive: A Guide for Your Journey with Diabetes, Meal plan card, My Plate and Snack sheet  If problems or questions, patient to contact team via:  Phone and Email  Future DSME appointment: 4-6 wks

## 2020-04-05 ENCOUNTER — Telehealth: Payer: Self-pay

## 2020-04-05 NOTE — Chronic Care Management (AMB) (Signed)
  Care Management   Note  04/05/2020 Name: Stefanie Stewart MRN: [id number] DOB: 18-Jan-1976  Stefanie Stewart is a 45 y.o. year old female who is a primary care patient of [name], NP and is actively engaged with the care management team. I reached out to [name] by phone today to assist with re-scheduling a follow up visit with the Licensed Clinical Social Worker  Follow up plan: Unsuccessful telephone outreach attempt made. A HIPAA compliant phone message was left for the patient providing contact information and requesting a return call.  The care management team will reach out to the patient again over the next 7 days.  If patient returns call to provider office, please advise to call [facility]  at [place], [place], [place], [place] [address] Direct Dial: [phone] [email or web address] Website: [email or web address]

## 2020-04-07 ENCOUNTER — Other Ambulatory Visit: Payer: Self-pay | Admitting: Nurse Practitioner

## 2020-04-07 ENCOUNTER — Telehealth: Payer: Self-pay | Admitting: Pharmacy Technician

## 2020-04-07 NOTE — Telephone Encounter (Signed)
Patient called to say that she did not need [facility] to order the Jardiance, because she was not going to take it.  Patient stated that she has sent a message through MyChart to [facility], her provider.  Patient also stated that she is looking at possibly obtaining health insurance.  I explained that if she obtains prescription drug coverage that she will no longer be eligible for [facility] services.  Patient verbally acknowledged that she understood.  [facility]

## 2020-04-14 NOTE — Chronic Care Management (AMB) (Signed)
  Care Management   Note  04/14/2020 Name: Stefanie Stewart MRN: [id number] DOB: 24-Aug-1975  Stefanie Stewart is a 45 y.o. year old female who is a primary care patient of [name], NP and is actively engaged with the care management team. I reached out to Stefanie Stewart by phone today to assist with re-scheduling a follow up visit with the Licensed Clinical Social Worker  Follow up plan: Patient declines further follow up and engagement by the care management team. Appropriate care team members and provider have been notified via electronic communication.   Noreene Larsson, [place], [place], [place] [address] Direct Dial: [phone] [email or web address]: [email or web address]

## 2020-04-15 ENCOUNTER — Telehealth: Payer: Self-pay

## 2020-04-15 ENCOUNTER — Telehealth: Payer: Self-pay | Admitting: General Practice

## 2020-04-15 NOTE — Telephone Encounter (Signed)
  Chronic Care Management   Outreach Note  04/15/2020 Name: Stefanie Stewart MRN: [id number] DOB: May 11, 1975  Referred by: [name], NP Reason for referral : Appointment (RNCM: Chronic Disease Management and Care Coordination Needs)   A second unsuccessful telephone outreach was attempted today. The patient was referred to the case management team for assistance with care management and care coordination.   Follow Up Plan: Telephone follow up appointment with care management team member scheduled for: A HIPAA compliant phone message was left for the patient providing contact information and requesting a return call.   Noreene Larsson RN, MSN, [facility] Mobile: [phone]

## 2020-04-18 ENCOUNTER — Telehealth: Payer: Self-pay | Admitting: Pharmacist

## 2020-04-18 NOTE — Telephone Encounter (Signed)
04/18/2020 9:35:40 AM - Stefanie Stewart pending pat. to return paperwork  -- Stefanie Stewart - Monday, April 18, 2020 9:34 AM --Stefanie Stewart pending-patient to return paperwork mailed to her 03/31/2020--I have received the signed paperwork back from provider signed 04/12/2020.

## 2020-04-20 NOTE — Telephone Encounter (Signed)
erroneous error  

## 2020-04-25 ENCOUNTER — Ambulatory Visit: Payer: Self-pay | Admitting: Nurse Practitioner

## 2020-04-26 ENCOUNTER — Telehealth: Payer: Self-pay

## 2020-04-26 NOTE — Chronic Care Management (AMB) (Signed)
  Care Management   Note  04/26/2020 Name: Lawonda Pretlow MRN: [id number] DOB: 1975-11-04  Stefanie Stewart is a 45 y.o. year old female who is a primary care patient of [name], NP and is actively engaged with the care management team. I reached out to [name] by phone today to assist with re-scheduling a follow up visit with the RN Case Manager Licensed Clinical Social Worker  Follow up plan: Patient declines further follow up and engagement by the care management team. Appropriate care team members and provider have been notified via electronic communication.   Noreene Larsson, [place], [place], [place] [address] Direct Dial: [phone] [email or web address] Website: [email or web address]

## 2020-04-26 NOTE — Telephone Encounter (Signed)
Spoke with patient and she did not want to reschedule with you or Jerene Pitch

## 2020-04-26 NOTE — Telephone Encounter (Signed)
Patient declined further f/u

## 2020-05-09 ENCOUNTER — Other Ambulatory Visit: Payer: Self-pay

## 2020-05-09 ENCOUNTER — Encounter: Payer: Self-pay | Admitting: Nurse Practitioner

## 2020-05-09 ENCOUNTER — Ambulatory Visit (INDEPENDENT_AMBULATORY_CARE_PROVIDER_SITE_OTHER): Payer: 59 | Admitting: Nurse Practitioner

## 2020-05-09 VITALS — BP 120/74 | HR 87 | Temp 98.0°F | Ht 68.5 in | Wt 245.0 lb

## 2020-05-09 DIAGNOSIS — E1159 Type 2 diabetes mellitus with other circulatory complications: Secondary | ICD-10-CM

## 2020-05-09 DIAGNOSIS — E785 Hyperlipidemia, unspecified: Secondary | ICD-10-CM

## 2020-05-09 DIAGNOSIS — R809 Proteinuria, unspecified: Secondary | ICD-10-CM

## 2020-05-09 DIAGNOSIS — E1169 Type 2 diabetes mellitus with other specified complication: Secondary | ICD-10-CM

## 2020-05-09 DIAGNOSIS — E1129 Type 2 diabetes mellitus with other diabetic kidney complication: Secondary | ICD-10-CM

## 2020-05-09 DIAGNOSIS — I152 Hypertension secondary to endocrine disorders: Secondary | ICD-10-CM

## 2020-05-09 MED ORDER — LOSARTAN POTASSIUM 25 MG PO TABS: 25.0000 mg | ORAL_TABLET | Freq: Every day | ORAL | 4 refills | Status: DC

## 2020-05-09 MED ORDER — SERTRALINE HCL 50 MG PO TABS: 75.0000 mg | ORAL_TABLET | Freq: Every day | ORAL | 4 refills | Status: DC

## 2020-05-09 MED ORDER — REPATHA SURECLICK 140 MG/ML ~~LOC~~ SOAJ: 140.0000 mg | SUBCUTANEOUS | 12 refills | Status: DC

## 2020-05-09 MED ORDER — METFORMIN HCL 500 MG PO TABS: 1000.0000 mg | ORAL_TABLET | Freq: Two times a day (BID) | ORAL | 4 refills | Status: DC

## 2020-05-09 MED ORDER — BUPROPION HCL ER (XL) 150 MG PO TB24: 150.0000 mg | ORAL_TABLET | Freq: Every day | ORAL | 4 refills | Status: DC

## 2020-05-09 NOTE — Assessment & Plan Note (Signed)
Refer to diabetes with proteinuria plan. 

## 2020-05-09 NOTE — Assessment & Plan Note (Signed)
Current BMI 36.71 with T2DM and HTN.  She has lost 20 pounds over past year.  Recommended eating smaller high protein, low fat meals more frequently and exercising 30 mins a day 5 times a week with a goal of 10-15lb weight loss in the next 3 months. Patient voiced their understanding and motivation to adhere to these recommendations.

## 2020-05-09 NOTE — Assessment & Plan Note (Signed)
Chronic, ongoing.  History of poor tolerance statin.  Continue Repatha at this time, which she is tolerating.  Lipid panel next visit.  Return in 3 months.

## 2020-05-09 NOTE — Assessment & Plan Note (Signed)
Chronic, stable with BP improving with Losartan addition -- urine ALB last visit 150.  At this continue Losartan 25 MG daily and then adjust in future as needed.  Discussed benefit of ARB for kidney protection with proteinuria.  Recommend she monitor BP at least a few mornings a week at home and document.  DASH diet at home.  Labs next visit, recent K+ 4.5.  Return in 3 months.

## 2020-05-09 NOTE — Patient Instructions (Signed)
Diabetes Mellitus and Nutrition, Adult When you have diabetes, or diabetes mellitus, it is very important to have healthy eating habits because your blood sugar (glucose) levels are greatly affected by what you eat and drink. Eating healthy foods in the right amounts, at about the same times every day, can help you:  Control your blood glucose.  Lower your risk of heart disease.  Improve your blood pressure.  Reach or maintain a healthy weight. What can affect my meal plan? Every person with diabetes is different, and each person has different needs for a meal plan. Your health care provider may recommend that you work with a dietitian to make a meal plan that is best for you. Your meal plan may vary depending on factors such as:  The calories you need.  The medicines you take.  Your weight.  Your blood glucose, blood pressure, and cholesterol levels.  Your activity level.  Other health conditions you have, such as heart or kidney disease. How do carbohydrates affect me? Carbohydrates, also called carbs, affect your blood glucose level more than any other type of food. Eating carbs naturally raises the amount of glucose in your blood. Carb counting is a method for keeping track of how many carbs you eat. Counting carbs is important to keep your blood glucose at a healthy level, especially if you use insulin or take certain oral diabetes medicines. It is important to know how many carbs you can safely have in each meal. This is different for every person. Your dietitian can help you calculate how many carbs you should have at each meal and for each snack. How does alcohol affect me? Alcohol can cause a sudden decrease in blood glucose (hypoglycemia), especially if you use insulin or take certain oral diabetes medicines. Hypoglycemia can be a life-threatening condition. Symptoms of hypoglycemia, such as sleepiness, dizziness, and confusion, are similar to symptoms of having too much  alcohol.  Do not drink alcohol if: ? Your health care provider tells you not to drink. ? You are pregnant, may be pregnant, or are planning to become pregnant.  If you drink alcohol: ? Do not drink on an empty stomach. ? Limit how much you use to:  0-1 drink a day for women.  0-2 drinks a day for men. ? Be aware of how much alcohol is in your drink. In the [place] one drink equals one 12 oz bottle of beer (355 mL), one 5 oz glass of wine (148 mL), or one 1 oz glass of hard liquor (44 mL). ? Keep yourself hydrated with water, diet soda, or unsweetened iced tea.  Keep in mind that regular soda, juice, and other mixers may contain a lot of sugar and must be counted as carbs. What are tips for following this plan? Reading food labels  Start by checking the serving size on the "Nutrition Facts" label of packaged foods and drinks. The amount of calories, carbs, fats, and other nutrients listed on the label is based on one serving of the item. Many items contain more than one serving per package.  Check the total grams (g) of carbs in one serving. You can calculate the number of servings of carbs in one serving by dividing the total carbs by 15. For example, if a food has 30 g of total carbs per serving, it would be equal to 2 servings of carbs.  Check the number of grams (g) of saturated fats and trans fats in one serving. Choose foods that have   a low amount or none of these fats.  Check the number of milligrams (mg) of salt (sodium) in one serving. Most people should limit total sodium intake to less than 2,300 mg per day.  Always check the nutrition information of foods labeled as "low-fat" or "nonfat." These foods may be higher in added sugar or refined carbs and should be avoided.  Talk to your dietitian to identify your daily goals for nutrients listed on the label. Shopping  Avoid buying canned, pre-made, or processed foods. These foods tend to be high in fat, sodium, and added  sugar.  Shop around the outside edge of the grocery store. This is where you will most often find fresh fruits and vegetables, bulk grains, fresh meats, and fresh dairy. Cooking  Use low-heat cooking methods, such as baking, instead of high-heat cooking methods like deep frying.  Cook using healthy oils, such as olive, canola, or sunflower oil.  Avoid cooking with butter, cream, or high-fat meats. Meal planning  Eat meals and snacks regularly, preferably at the same times every day. Avoid going long periods of time without eating.  Eat foods that are high in fiber, such as fresh fruits, vegetables, beans, and whole grains. Talk with your dietitian about how many servings of carbs you can eat at each meal.  Eat 4-6 oz (112-168 g) of lean protein each day, such as lean meat, chicken, fish, eggs, or tofu. One ounce (oz) of lean protein is equal to: ? 1 oz (28 g) of meat, chicken, or fish. ? 1 egg. ?  cup (62 g) of tofu.  Eat some foods each day that contain healthy fats, such as avocado, nuts, seeds, and fish.   What foods should I eat? Fruits Berries. Apples. Oranges. Peaches. Apricots. Plums. Grapes. Mango. Papaya. Pomegranate. Kiwi. Cherries. Vegetables Lettuce. Spinach. Leafy greens, including kale, chard, collard greens, and mustard greens. Beets. Cauliflower. Cabbage. Broccoli. Carrots. Green beans. Tomatoes. Peppers. Onions. Cucumbers. Brussels sprouts. Grains Whole grains, such as whole-wheat or whole-grain bread, crackers, tortillas, cereal, and pasta. Unsweetened oatmeal. Quinoa. Brown or wild rice. Meats and other proteins Seafood. Poultry without skin. Lean cuts of poultry and beef. Tofu. Nuts. Seeds. Dairy Low-fat or fat-free dairy products such as milk, yogurt, and cheese. The items listed above may not be a complete list of foods and beverages you can eat. Contact a dietitian for more information. What foods should I avoid? Fruits Fruits canned with  syrup. Vegetables Canned vegetables. Frozen vegetables with butter or cream sauce. Grains Refined white flour and flour products such as bread, pasta, snack foods, and cereals. Avoid all processed foods. Meats and other proteins Fatty cuts of meat. Poultry with skin. Breaded or fried meats. Processed meat. Avoid saturated fats. Dairy Full-fat yogurt, cheese, or milk. Beverages Sweetened drinks, such as soda or iced tea. The items listed above may not be a complete list of foods and beverages you should avoid. Contact a dietitian for more information. Questions to ask a health care provider  Do I need to meet with a diabetes educator?  Do I need to meet with a dietitian?  What number can I call if I have questions?  When are the best times to check my blood glucose? Where to find more information:  American Diabetes Association: diabetes.org  Academy of Nutrition and Dietetics: www.eatright.org  National Institute of Diabetes and Digestive and Kidney Diseases: www.niddk.nih.gov  Association of Diabetes Care and Education Specialists: www.diabeteseducator.org Summary  It is important to have healthy eating   habits because your blood sugar (glucose) levels are greatly affected by what you eat and drink.  A healthy meal plan will help you control your blood glucose and maintain a healthy lifestyle.  Your health care provider may recommend that you work with a dietitian to make a meal plan that is best for you.  Keep in mind that carbohydrates (carbs) and alcohol have immediate effects on your blood glucose levels. It is important to count carbs and to use alcohol carefully. This information is not intended to replace advice given to you by your health care provider. Make sure you discuss any questions you have with your health care provider. Document Revised: 02/17/2019 Document Reviewed: 02/17/2019 Elsevier Patient Education  2021 Elsevier Inc.  

## 2020-05-09 NOTE — Assessment & Plan Note (Signed)
New diagnosis A1c 11.3 end of December, sugars are trending down via glucometer checks.  Urine ALB 150 last visit.  Continue Metformin and recommend she go to max dose 1000 MG BID.  Continue Losartan 25 MG daily for kidney protection and continue Repatha for cholesterol reduction.  Continue diabetes education.  Educated on blood sugar goals, less then 130 fasting in morning and <180 two hours after a meal.  Will have her return at end of March for A1c check.  If poor tolerance of Metformin at max dose could consider a GLP1, which would also benefit weight loss.

## 2020-05-09 NOTE — Progress Notes (Signed)
BP 120/74 (BP Location: Left Arm)   Pulse 87   Temp 98 F (36.7 C) (Oral)   Ht 5' 8.5" (1.74 m)   Wt 245 lb (111.1 kg)   SpO2 97%   BMI 36.71 kg/m    Subjective:    Patient ID: Stefanie Stewart, female    DOB: November 21, 1975, 46 y.o.   MRN: [id number]  HPI: Stefanie Stewart is a 45 y.o. female  Chief Complaint  Patient presents with  . Diabetes   DIABETES A1c in December was 11.3 and urine ALB 150 -- started on Metformin 1000 MG BID -- currently is taking 1000 MG in morning and 500 MG at night -- did have some diarrhea with this that is now improving.  She was also started on Losartan 25 MG for BP and albuminuria.  Has attended one diabetic education class. Hypoglycemic episodes:no Polydipsia/polyuria: no Visual disturbance: no Chest pain: no Paresthesias: no Glucose Monitoring: yes  Accucheck frequency: Daily  Fasting glucose: 170-180 -- are coming down.  Post prandial:  Evening:  Before meals: Taking Insulin?: no  Long acting insulin:  Short acting insulin: Blood Pressure Monitoring: not checking Retinal Examination: Up to Date -- in October Foot Exam: Up to Date Diabetic Education: went to one session Pneumovax: refused Influenza: refused Aspirin: no  Relevant past medical, surgical, family and social history reviewed and updated as indicated. Interim medical history since our last visit reviewed. Allergies and medications reviewed and updated.  Review of Systems  Constitutional: Negative for activity change, appetite change, diaphoresis, fatigue and fever.  Respiratory: Negative for cough, chest tightness and shortness of breath.   Cardiovascular: Negative for chest pain, palpitations and leg swelling.  Gastrointestinal: Negative.   Endocrine: Negative for polydipsia, polyphagia and polyuria.  Neurological: Negative.   Psychiatric/Behavioral: Negative.     Per HPI unless specifically indicated above     Objective:    BP 120/74 (BP Location: Left Arm)    Pulse 87   Temp 98 F (36.7 C) (Oral)   Ht 5' 8.5" (1.74 m)   Wt 245 lb (111.1 kg)   SpO2 97%   BMI 36.71 kg/m   Wt Readings from Last 3 Encounters:  05/09/20 245 lb (111.1 kg)  04/04/20 246 lb 3.2 oz (111.7 kg)  03/24/20 248 lb 4.8 oz (112.6 kg)    Physical Exam Vitals and nursing note reviewed.  Constitutional:      General: She is awake. She is not in acute distress.    Appearance: She is well-developed and well-groomed. She is obese. She is not ill-appearing.  HENT:     Head: Normocephalic.     Right Ear: Hearing normal.     Left Ear: Hearing normal.  Eyes:     General: Lids are normal.        Right eye: No discharge.        Left eye: No discharge.     Conjunctiva/sclera: Conjunctivae normal.     Pupils: Pupils are equal, round, and reactive to light.  Neck:     Thyroid: No thyromegaly.     Vascular: No carotid bruit.  Cardiovascular:     Rate and Rhythm: Normal rate and regular rhythm.     Heart sounds: Normal heart sounds. No murmur heard. No gallop.   Pulmonary:     Effort: Pulmonary effort is normal. No accessory muscle usage or respiratory distress.     Breath sounds: Normal breath sounds.  Abdominal:     General: Bowel  sounds are normal.     Palpations: Abdomen is soft. There is no hepatomegaly or splenomegaly.  Musculoskeletal:     Cervical back: Normal range of motion and neck supple.     Right lower leg: No edema.     Left lower leg: No edema.  Lymphadenopathy:     Cervical: No cervical adenopathy.  Skin:    General: Skin is warm and dry.  Neurological:     Mental Status: She is alert and oriented to person, place, and time.  Psychiatric:        Attention and Perception: Attention normal.        Mood and Affect: Mood normal.        Speech: Speech normal.        Behavior: Behavior normal. Behavior is cooperative.        Thought Content: Thought content normal.     Results for orders placed or performed in visit on 03/24/20  Comprehensive  metabolic panel  Result Value Ref Range   Glucose 290 (H) 65 - 99 mg/dL   BUN 15 6 - 24 mg/dL   Creatinine, Ser 0.64 0.57 - 1.00 mg/dL   GFR calc non Af Amer 107 >59 mL/min/1.73   GFR calc Af Amer 124 >59 mL/min/1.73   BUN/Creatinine Ratio 23 9 - 23   Sodium 134 134 - 144 mmol/L   Potassium 4.5 3.5 - 5.2 mmol/L   Chloride 97 96 - 106 mmol/L   CO2 21 20 - 29 mmol/L   Calcium 9.7 8.7 - 10.2 mg/dL   Total Protein 7.7 6.0 - 8.5 g/dL   Albumin 4.8 3.8 - 4.8 g/dL   Globulin, Total 2.9 1.5 - 4.5 g/dL   Albumin/Globulin Ratio 1.7 1.2 - 2.2   Bilirubin Total 0.9 0.0 - 1.2 mg/dL   Alkaline Phosphatase 67 44 - 121 IU/L   AST 37 0 - 40 IU/L   ALT 62 (H) 0 - 32 IU/L  Bayer DCA Hb A1c Waived  Result Value Ref Range   HB A1C (BAYER DCA - WAIVED) 11.3 (H) <7.0 %  Microalbumin, Urine Waived  Result Value Ref Range   Microalb, Ur Waived 150 (H) 0 - 19 mg/L   Creatinine, Urine Waived 200 10 - 300 mg/dL   Microalb/Creat Ratio >300 (H) <30 mg/g  Lipid Panel w/o Chol/HDL Ratio  Result Value Ref Range   Cholesterol, Total 223 (H) 100 - 199 mg/dL   Triglycerides 744 (HH) 0 - 149 mg/dL   HDL 25 (L) >39 mg/dL   VLDL Cholesterol Cal 119 (H) 5 - 40 mg/dL   LDL Chol Calc (NIH) 79 0 - 99 mg/dL      Assessment & Plan:   Problem List Items Addressed This Visit      Cardiovascular and Mediastinum   Hypertension associated with diabetes (HCC) (Chronic)    Chronic, stable with BP improving with Losartan addition -- urine ALB last visit 150.  At this continue Losartan 25 MG daily and then adjust in future as needed.  Discussed benefit of ARB for kidney protection with proteinuria.  Recommend she monitor BP at least a few mornings a week at home and document.  DASH diet at home.  Labs next visit, recent K+ 4.5.  Return in 3 months.       Relevant Medications   Evolocumab (REPATHA SURECLICK) 209 MG/ML SOAJ   losartan (COZAAR) 25 MG tablet   metFORMIN (GLUCOPHAGE) 500 MG tablet     Endocrine  Hyperlipidemia associated with type 2 diabetes mellitus (HCC) (Chronic)    Chronic, ongoing.  History of poor tolerance statin.  Continue Repatha at this time, which she is tolerating.  Lipid panel next visit.  Return in 3 months.      Relevant Medications   Evolocumab (REPATHA SURECLICK) 403 MG/ML SOAJ   losartan (COZAAR) 25 MG tablet   metFORMIN (GLUCOPHAGE) 500 MG tablet   Type 2 diabetes mellitus with morbid obesity ([place]) (Chronic)    Refer to diabetes with proteinuria plan.      Relevant Medications   losartan (COZAAR) 25 MG tablet   metFORMIN (GLUCOPHAGE) 500 MG tablet   Type 2 diabetes mellitus with proteinuria (HCC) - Primary (Chronic)    New diagnosis A1c 11.3 end of December, sugars are trending down via glucometer checks.  Urine ALB 150 last visit.  Continue Metformin and recommend she go to max dose 1000 MG BID.  Continue Losartan 25 MG daily for kidney protection and continue Repatha for cholesterol reduction.  Continue diabetes education.  Educated on blood sugar goals, less then 130 fasting in morning and <180 two hours after a meal.  Will have her return at end of March for A1c check.  If poor tolerance of Metformin at max dose could consider a GLP1, which would also benefit weight loss.      Relevant Medications   losartan (COZAAR) 25 MG tablet   metFORMIN (GLUCOPHAGE) 500 MG tablet     Other   Morbid obesity (HCC)    Current BMI 36.71 with T2DM and HTN.  She has lost 20 pounds over past year.  Recommended eating smaller high protein, low fat meals more frequently and exercising 30 mins a day 5 times a week with a goal of 10-15lb weight loss in the next 3 months. Patient voiced their understanding and motivation to adhere to these recommendations.       Relevant Medications   metFORMIN (GLUCOPHAGE) 500 MG tablet       Follow up plan: Return in about 6 weeks (around 06/22/2020) for T2DM, HTN/HLD, COPD.

## 2020-05-13 ENCOUNTER — Telehealth: Payer: Self-pay | Admitting: Nurse Practitioner

## 2020-05-13 ENCOUNTER — Ambulatory Visit (INDEPENDENT_AMBULATORY_CARE_PROVIDER_SITE_OTHER): Payer: 59 | Admitting: Psychiatry

## 2020-05-13 ENCOUNTER — Other Ambulatory Visit: Payer: Self-pay

## 2020-05-13 DIAGNOSIS — R69 Illness, unspecified: Secondary | ICD-10-CM

## 2020-05-13 DIAGNOSIS — Z636 Dependent relative needing care at home: Secondary | ICD-10-CM

## 2020-05-13 DIAGNOSIS — E119 Type 2 diabetes mellitus without complications: Secondary | ICD-10-CM | POA: Diagnosis not present

## 2020-05-13 DIAGNOSIS — Z0282 Encounter for adoption services: Secondary | ICD-10-CM

## 2020-05-13 DIAGNOSIS — F320 Major depressive disorder, single episode, mild: Secondary | ICD-10-CM

## 2020-05-13 DIAGNOSIS — Z630 Problems in relationship with spouse or partner: Secondary | ICD-10-CM

## 2020-05-13 NOTE — Telephone Encounter (Signed)
Pharmacy called to inform the doctor that the medication, Evolocumab (REPATHA SURECLICK) 818 MG/ML SOAJ, is requiring a prior authorization and is not going through.  Please advise and call pharmacy to discuss at [phone]

## 2020-05-13 NOTE — Progress Notes (Signed)
PROBLEM-FOCUSED INITIAL PSYCHOTHERAPY EVALUATION Stefanie Moore, PhD LP [facility]  Name: Stefanie Stewart Date: 05/13/2020 Time spent: 50 min MRN: [id number] DOB: July 10, 1975 Guardian/Payee: self  PCP: [name], NP Documentation requested on this visit: No  PROBLEM HISTORY Reason for Visit /Presenting Problem:  Chief Complaint  Patient presents with  . Establish Care  . Stress  . Depression    Narrative/History of Present Illness Self-referred, reports 5 years of a lot of changes, grief.  Mother recently passed after several years of daily care.  Had dementia for years, father hid it.  Mother fell, discovered a fast-growing cancer, died 64 days later in 11-28-2022.  Right now working through her taxes.  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  Had to be on unemployment for a while, back employed may 2021.  Brother Stefanie Stewart) lied about having quit his job to keep from being asked to help with father.  Learned in December she is diabetic, realizes it's come from a lot of comfort eating and stress.  7 weeks now into better self-care and Metformin, making a difference.  Lots of toxic people put out of her life (e.g., friend Hassan Rowan, who used to frequently ...).  Ex-BF Abe People a "squatter", put out after 20 years but showed up unannounced last Friday.  Twice has had to put him out of the house.  Christmas was dismal, first without both a living parent and an in-home partner.  Has tried to get him to respect the distance and not bother her so much but has not come out and said she wants the relationship over, only gives "not now" reasons.  Consequently, he keeps pestering.  A lot of anger about getting dumped on by parents, brother.  Son provided some respite one w/e a month with parent care.  No burden to support [place].  Nobody understands enough, best friend wants her to "move on" and "not be so negative".  Billy lobbies for attention without  respect for how she's pulled in other directions.  Wellbutrin for 20 years, plus sertraline.  Has never had suicidal depression.  Primary care NP urges her to stay on sertraline, it's what's keeping her well, but really wonders if it's lost effectiveness.  Also feels limiting to her to just settle for medication, as if she isn't an agent in her own life and capable of reassessing and improving her coping.  Wants to do so.  Currently walking daily, dedicated to treating her diabetes assertively and behaviorally and avoiding the stark consequences of uncontrolled blood sugar after being shocked to find out her A1C reached 11.4.  Might like to get back to swim class, will look into it.  Prior Psychiatric Assessment/Treatment:   Outpatient treatment: deferred Psychiatric hospitalization: none stated Psychological assessment/testing: none stated   Abuse/neglect screening: Victim of abuse: Not assessed at this time / none suspected.   Victim of neglect: Not assessed at this time / none suspected.   Perpetrator of abuse/neglect: Not assessed at this time / none suspected.   Witness / Exposure to Domestic Violence: Not assessed at this time / none suspected.   Witness to Community Violence:  Not assessed at this time / none suspected.   Protective Services Involvement: No.   Report needed: No.    Substance abuse screening: Current substance abuse: Not assessed at this time / none suspected.   History of impactful substance use/abuse: Not assessed at this time / none suspected.  FAMILY/SOCIAL HISTORY Family of origin -- Stefanie Stewart and B Stefanie Stewart both adopted as infants.  She did try to search for her birth mother earlier, but private eye costs prohibited following through.  Met her when pregnant, thankful upon learning about birth M's earlier life, enough to see that she has sense enough to give up parental rights when she could not fulfill them and got Stefanie Stewart to where she could be taken care of.  Felt she  had a happy, safe, nourishing childhood, all told. Family of intention/current living situation -- lives alone, recently separated from dysfunctional cohabitation.  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  Of note for psychotherapy at this time are current emphasis on treating diabetes and a range of stress-related and stressing health conditions.  Also may need to address lagging effect of longterm antidepressant.  Past Medical History:  Diagnosis Date  . Anxiety   . Cervical syndrome   . COPD (chronic obstructive pulmonary disease) ([place])   . Depression   . Edema 2013   legs  . Endometriosis   . Hyperlipidemia   . Hypertension   . IBS (irritable bowel syndrome)   . Lump or mass in breast    left   . Obesity   . Plantar fibromatosis   . Tobacco abuse   . Type 2 diabetes mellitus with morbid obesity ([place]) 03/24/2020     Past Surgical History:  Procedure Laterality Date  . BREAST BIOPSY Right   . CERVICAL BIOPSY  W/ LOOP ELECTRODE EXCISION    . CESAREAN SECTION  1996  . COLPOSCOPY  2007  . LEEP  2007    Allergies  Allergen Reactions  . Bactrim [Sulfamethoxazole-Trimethoprim] Itching    Itching (skin and throat). Denies SOB or throat closing.  . Banana Other (See Comments)    Reports lip swelling. Every now and then pt states she has nausea and vomiting.    Medications (as listed in Epic): Current Outpatient Medications  Medication Sig Dispense Refill  . acetaminophen (TYLENOL) 500 MG tablet Take 500  mg by mouth every 6 (six) hours as needed. Takes 2 QAM and 2 QPM (Patient not taking: Reported on 06/03/2020)    . aspirin-acetaminophen-caffeine (EXCEDRIN MIGRAINE) 250-250-65 MG tablet Take by mouth every 6 (six) hours as needed for headache.    . blood glucose meter kit and supplies KIT Dispense based on patient and insurance preference. Use up to four times daily as directed. (FOR ICD-9 250.00, 250.01). 1 each 0  . buPROPion (WELLBUTRIN XL) 150 MG 24 hr tablet Take 1 tablet (150 mg total) by mouth daily. 90 tablet 4  . cholecalciferol (VITAMIN D3) 25 MCG (1000 UNIT) tablet Take 1,000 Units by mouth daily.    . Evolocumab (REPATHA SURECLICK) 726 MG/ML SOAJ Inject 140 mg into the skin every 14 (fourteen) days. 1 mL 12  . losartan (COZAAR) 25 MG tablet Take 1 tablet (25 mg total) by mouth daily. 90 tablet 4  . metFORMIN (GLUCOPHAGE) 500 MG tablet Take 2 tablets (1,000 mg total) by mouth 2 (two) times daily with a meal. 360 tablet 4  . methocarbamol (  ROBAXIN) 500 MG tablet Take 1 tablet (500 mg total) by mouth every 8 (eight) hours as needed for muscle spasms. (Patient not taking: Reported on 06/03/2020) 30 tablet 1  . norethindrone (AYGESTIN) 5 MG tablet Take 1 tablet by mouth once daily 90 tablet 0  . peg 3350 powder (MOVIPREP) 100 g SOLR Take 1 kit (200 g total) by mouth once for 1 dose. moviprep as directed. No substitutions 1 kit 0  . sertraline (ZOLOFT) 50 MG tablet Take 1.5 tablets (75 mg total) by mouth daily. 90 tablet 4   Current Facility-Administered Medications  Medication Dose Route Frequency Provider Last Rate Last Admin  . 0.9 %  sodium chloride infusion  500 mL Intravenous Continuous Ladene Artist, MD        MENTAL STATUS AND OBSERVATIONS Appearance:   Casual     Behavior:  Appropriate  Motor:  Restlestness  Speech/Language:   Clear and Coherent  Affect:  Appropriate  Mood:  dysthymic  Thought process:  normal  Thought content:    WNL  Sensory/Perceptual disturbances:     WNL  Orientation:  Fully oriented  Attention:  Good  Concentration:  Good  Memory:  WNL  Fund of knowledge:   Good  Insight:    Good  Judgment:   Good  Impulse Control:  Good   Initial Risk Assessment: Danger to self: No Self-injurious behavior: No Danger to others: No Physical aggression / violence: No Duty to warn: No Access to firearms a concern: No Gang involvement: No Patient / guardian was educated about steps to take if suicide or homicide risk level increases between visits: yes . While future psychiatric events cannot be accurately predicted, the patient does not currently require acute inpatient psychiatric care and does not currently meet [facility] involuntary commitment criteria.   DIAGNOSIS:    ICD-10-CM   1. Current mild episode of major depressive disorder without prior episode ([place])  F32.0   2. Caregiver stress  Z63.6   3. Relationship problem between partners  Z63.0   4. Type 2 diabetes mellitus without complication, without long-term current use of insulin ([facility])  E11.9   5. Adopted  Z02.82   6. r/o SSRI poop-out syndrome  R69     INITIAL TREATMENT: . Support/validation provided for distressing symptoms and confirmed rapport . Ethical orientation and informed consent confirmed re: o privacy rights -- including but not limited to HIPAA, EMR and use of e-PHI o patient responsibilities -- scheduling, fair notice of changes, in-person vs. telehealth and regulatory and financial conditions affecting choice o expectations for working relationship in psychotherapy o needs and consents for working partnerships and exchange of information with other health care providers, especially any medication and other behavioral health providers . Initial orientation to cognitive-behavioral and solution-focused therapy approach . Psychoeducation and initial recommendations: o Interpreted a chronic stress syndrome in which high responsibilities and overwork by choice took a  toll, bargained with unhealthy convenience foods and lack of physical activity, created stress-related illness.  Affirmed turnaround in progress. o Advised with Billy -- don't rely on temporary-sounding reasons, OK to just tell him we tried, didn't work o Re. diabetes, endorsed calorie-burning, physical activity, and carb control as undertaken.  Possible benefit of more ketogenic food choices.  Educated on serotonin release as a likely mechanism for food compulsions she has indulged before, credited endorphin release from exercise. . Outlook for therapy -- scheduling constraints, availability of crisis service, inclusion of family member(s) as appropriate  Plan: .  Continue with diabetes management measures.  Option incorporate keto principles more  . Endorse walking habit, add swimming as desired and feasible . Use assertiveness tips for [place] situation . Option refer to psychiatry for review of antidepressant strategy . Maintain medication as prescribed and work faithfully with relevant prescriber(s) if any changes are desired or seem indicated . Call the clinic on-call service, present to ER, or call 911 if any life-threatening psychiatric crisis Return Q 2 wks.  Blanchie Serve, PhD  Stefanie Moore, PhD LP Clinical Psychologist, [facility] [address] [place], [place] [address] [phone]

## 2020-05-16 ENCOUNTER — Telehealth: Payer: Self-pay

## 2020-05-16 ENCOUNTER — Encounter: Payer: Self-pay | Admitting: Dietician

## 2020-05-16 ENCOUNTER — Encounter: Payer: 59 | Attending: Nurse Practitioner | Admitting: Dietician

## 2020-05-16 ENCOUNTER — Telehealth: Payer: Self-pay | Admitting: Pharmacy Technician

## 2020-05-16 ENCOUNTER — Other Ambulatory Visit: Payer: Self-pay

## 2020-05-16 DIAGNOSIS — E1169 Type 2 diabetes mellitus with other specified complication: Secondary | ICD-10-CM | POA: Diagnosis present

## 2020-05-16 NOTE — Progress Notes (Signed)
Diabetes Self-Management Education  Visit Type: Follow-up  Appt. Start Time: 1400 Appt. End Time: 1440  05/16/2020  [name], identified by name and date of birth, is a 45 y.o. female with a diagnosis of Diabetes: Type 2.   ASSESSMENT  Pt reports weight loss of 1 pound this past week. Pt reports walking regularly. Pt reports taking only 1 metformin in the morning and the evening when they were supposed to be taking 2 each time. Pt reports taking appropriate dose for the past week. Pt reports having a large problem with self control over unhealthy food. Pt reports "eating their feelings". Pt reports starting to see therapist , and states having a lot to get off their chest. Pt reports lots of family stresses and pandemic stress led to developing diabetes. Pt reports cravings have subsided some since our last visit. Pt reports chemical taste in their mouth at times. Pt reports eating gluten-free pizza instead of regular pizza. Pt reports being around [employer] cookies all the time at work and has been actively avoiding eating sweets. Pt reports skipping lunch and snacking a lot in the evening.  Pt reports not wanting to develop health problems related to diabetes including tooth extraction, kidney disease, and amputations.  FBG - 140-160  Height 5\' 8"  (1.727 m), weight 243 lb 11.2 oz (110.5 kg). Body mass index is 37.05 kg/m.   Diabetes Self-Management Education - 05/16/20 1424      Visit Information   Visit Type Follow-up      Initial Visit   Diabetes Type Type 2    Are you taking your medications as prescribed? Yes   Pt was under dosing metformin, now corrected dosage     Psychosocial Assessment   Patient Belief/Attitude about Diabetes Afraid      Pre-Education Assessment   Patient understands the diabetes disease and treatment process. Needs Review    Patient understands incorporating nutritional management into lifestyle. Needs Review    Patient undertands incorporating  physical activity into lifestyle. Needs Review    Patient understands using medications safely. Needs Review    Patient understands monitoring blood glucose, interpreting and using results Needs Review    Patient understands prevention, detection, and treatment of acute complications. Needs Review    Patient understands prevention, detection, and treatment of chronic complications. Needs Review    Patient understands how to develop strategies to address psychosocial issues. Needs Review    Patient understands how to develop strategies to promote health/change behavior. Needs Review      Complications   How often do you check your blood sugar? 1-2 times/day    Fasting Blood glucose range (mg/dL) 130-179      Dietary Intake   Breakfast Low Sodium V8, [name] sausage egg and cheese patty, cottage cheese    Snack (morning) mozzerella cheese stick    Lunch none    Snack (afternoon) none    Dinner broccoli, onion, chicken w/ sugar free BBQ sauce    Snack (evening) [place] and cheese sandwich, 1 personal bag of veggie sticks, 100 cal bag of pistachios, cottage cheese cup    Beverage(s) water      Exercise   Exercise Type ADL's;Light (walking / raking leaves)    How many days per week to you exercise? 3    How many minutes per day do you exercise? 30    Total minutes per week of exercise 90      Patient Self-Evaluation of Goals - Patient rates self  as meeting previously set goals (% of time)   Nutrition < 25%    Physical Activity 50 - 75 %    Medications 50 - 75 %    Monitoring 50 - 75 %    Problem Solving < 25%    Reducing Risk < 25%    Health Coping < 25%   Pt will begin seeing therapist in the next few weeks     Post-Education Assessment   Patient understands the diabetes disease and treatment process. Needs Review    Patient understands incorporating nutritional management into lifestyle. Needs Review    Patient undertands incorporating physical activity into lifestyle. Needs  Review    Patient understands using medications safely. Needs Review    Patient understands monitoring blood glucose, interpreting and using results Needs Review    Patient understands prevention, detection, and treatment of acute complications. Needs Review    Patient understands prevention, detection, and treatment of chronic complications. Needs Review    Patient understands how to develop strategies to address psychosocial issues. Needs Review    Patient understands how to develop strategies to promote health/change behavior. Needs Review      Outcomes   Expected Outcomes Demonstrated interest in learning. Expect positive outcomes    Future DMSE 2 months    Program Status Not Completed      Subsequent Visit   Since your last visit have you continued or begun to take your medications as prescribed? Yes    Since your last visit have you had your blood pressure checked? Yes    Is your most recent blood pressure lower, unchanged, or higher since your last visit? Unchanged    Since your last visit have you experienced any weight changes? Loss    Weight Loss (lbs) 1    Since your last visit, are you checking your blood glucose at least once a day? Yes   Only fasting          Individualized Plan for Diabetes Self-Management Training:   Learning Objective:  Patient will have a greater understanding of diabetes self-management. Patient education plan is to attend individual and/or group sessions per assessed needs and concerns.   Plan:   Patient Instructions  Have sugar free Jello as an evening snack.  Continue your healthy weight loss of one pound per week  Eat a balanced lunch EVERY DAY to prevent hunger in the evening  Have only half an avocado at a time. Cover the open side with a little lemon juice and plastic wrap it to keep it from spoiling.  Great job on working with your therapist!!   Expected Outcomes:  Demonstrated interest in learning. Expect positive outcomes  If  problems or questions, patient to contact team via:  Email  Future DSME appointment: 2 months

## 2020-05-16 NOTE — Telephone Encounter (Signed)
Patient called [facility] and stated that she no longer needed [facility]'s services, because she now has [employer] with prescription coverage.  [facility]

## 2020-05-16 NOTE — Telephone Encounter (Signed)
error 

## 2020-05-16 NOTE — Telephone Encounter (Signed)
PA for Repatha initiated and submitted via Cover My Meds. Key: [id number]

## 2020-05-16 NOTE — Patient Instructions (Addendum)
Have sugar free Jello as an evening snack.  Continue your healthy weight loss of one pound per week  Eat a balanced lunch EVERY DAY to prevent hunger in the evening  Have only half an avocado at a time. Cover the open side with a little lemon juice and plastic wrap it to keep it from spoiling.  Great job on working with your therapist!!

## 2020-05-20 ENCOUNTER — Other Ambulatory Visit: Payer: Self-pay

## 2020-05-20 ENCOUNTER — Ambulatory Visit (AMBULATORY_SURGERY_CENTER): Payer: Self-pay

## 2020-05-20 ENCOUNTER — Encounter: Payer: Self-pay | Admitting: Nurse Practitioner

## 2020-05-20 VITALS — Ht 68.5 in | Wt 247.0 lb

## 2020-05-20 DIAGNOSIS — Z1211 Encounter for screening for malignant neoplasm of colon: Secondary | ICD-10-CM

## 2020-05-20 MED ORDER — GOLYTELY 236 G PO SOLR: 4000.0000 mL | Freq: Once | ORAL | 0 refills | Status: AC

## 2020-05-20 NOTE — Telephone Encounter (Signed)
I will look at letter, but have also printed a letter to send to them as this medication is pertinent for patient health and lowering risk factors.

## 2020-05-20 NOTE — Progress Notes (Signed)

## 2020-05-20 NOTE — Telephone Encounter (Signed)
PA denied. See denial letter in folder.

## 2020-05-24 ENCOUNTER — Telehealth: Payer: Self-pay | Admitting: Gastroenterology

## 2020-05-24 DIAGNOSIS — Z1211 Encounter for screening for malignant neoplasm of colon: Secondary | ICD-10-CM

## 2020-05-24 MED ORDER — PEG-KCL-NACL-NASULF-NA ASC-C 100 G PO SOLR: 1.0000 | Freq: Once | ORAL | 0 refills | Status: DC

## 2020-05-24 NOTE — Telephone Encounter (Signed)
Appeal and letter submitted to patients insurance.

## 2020-05-24 NOTE — Telephone Encounter (Signed)
Generic Movi prep Is an Alternative to Golytely per plan description as [place] states they are unable to obtain a Golytely- sent in Movi prep generic to her [place] in Pt new Movi prep instructions to Pt's My Chart  Called pt- we went over Movi prep instructions- Explained to pt if the generic Movi Prep is not covered she can call us and we will send in Golytely script to a [facility] for her - she verbalized understanding

## 2020-05-24 NOTE — Telephone Encounter (Signed)
Pt states her insurance does not carry the prep for GOLYTELY (PEG-electrolytes) PREP.

## 2020-05-25 ENCOUNTER — Telehealth: Payer: Self-pay | Admitting: Nurse Practitioner

## 2020-05-25 NOTE — Telephone Encounter (Signed)
Spoke with patient to notified her that the PA for her Repatha prescription was denied, but Dr.[name] appealed the denial and sent a letter as well. Notified patient that once we have an update on the appeal, someone from our office will give her a call back. Patient verbalized understanding and has no further questions.

## 2020-05-25 NOTE — Telephone Encounter (Signed)
Pt calling to inquire if the PA was done on her Repatha again yesterday. Pt wants you to know she has tried for several days to contact her insurance company in order to help, but she cannot get anyone on the phone.

## 2020-05-26 ENCOUNTER — Other Ambulatory Visit: Payer: Self-pay | Admitting: Nurse Practitioner

## 2020-05-26 NOTE — Telephone Encounter (Signed)
Yes, she can go on Repatha website and obtain coupon card, if needs assist I can reach out to [place] our pharmacist too.

## 2020-05-26 NOTE — Telephone Encounter (Addendum)
Pt received paperwork from her insurance concerning the repatha . Pt also would like to know if jolene  recommends her going on the website [email or web address]  to see about getting a discount  And patient will only  5 dollars for medication. Pt would like jolene to look at website or does need to bring paperwork from the insurance company to be completed by office

## 2020-05-27 ENCOUNTER — Telehealth: Payer: Self-pay | Admitting: *Deleted

## 2020-05-27 ENCOUNTER — Other Ambulatory Visit: Payer: Self-pay | Admitting: Nurse Practitioner

## 2020-05-27 ENCOUNTER — Telehealth: Payer: Self-pay | Admitting: Gastroenterology

## 2020-05-27 DIAGNOSIS — E1169 Type 2 diabetes mellitus with other specified complication: Secondary | ICD-10-CM

## 2020-05-27 NOTE — Telephone Encounter (Signed)
Sent urgent referral to [place]

## 2020-05-27 NOTE — Chronic Care Management (AMB) (Signed)
  Chronic Care Management   Note  05/27/2020 Name: Selia Wareing MRN: [id number] DOB: 1976-02-08  Katrinia Straker is a 45 y.o. year old female who is a primary care patient of [name], [name], NP. Zaylynn Rickett is currently enrolled in care management services. An additional referral for Pharmacy was placed.   Follow up plan: Unsuccessful telephone outreach attempt made. A HIPAA compliant phone message was left for the patient providing contact information and requesting a return call.  The care management team will reach out to the patient again over the next 3-5 days.  If patient returns call to provider office, please advise to call [place] at [phone].  [place] Management

## 2020-05-27 NOTE — Telephone Encounter (Signed)
Pt called to go over the movi prep instructions again to be sure she has it all correct- Questions verified and answered about Prep- pt will call with any further questions

## 2020-05-27 NOTE — Chronic Care Management (AMB) (Signed)
  Care Management   Note  05/27/2020 Name: Boots Mcglown MRN: [id number] DOB: 07-21-1975  Leaira Fullam is a 45 y.o. year old female who is a primary care patient of [name], [name], NP. I reached out to [name] by phone today in response to a referral sent by Ms. Pleas Koch [place]'s PCP, [name], NP.  Ms. Deangelo was given information about care management services today including:  1. Care management services include personalized support from designated clinical staff supervised by her physician, including individualized plan of care and coordination with other care providers 2. 24/7 contact phone numbers for assistance for urgent and routine care needs. 3. The patient may stop care management services at any time by phone call to the office staff.  Patient agreed to services and verbal consent obtained.   Follow up plan: Telephone appointment with care management team member scheduled for:05/30/2020  [place] [facility]

## 2020-05-27 NOTE — Telephone Encounter (Signed)
Pt called back advised her of Jolene's message. She does want Julie's assistance.

## 2020-05-27 NOTE — Telephone Encounter (Signed)
Called pt to relay Stefanie Stewart's message no message left vm

## 2020-05-30 ENCOUNTER — Ambulatory Visit (INDEPENDENT_AMBULATORY_CARE_PROVIDER_SITE_OTHER): Payer: 59 | Admitting: Psychiatry

## 2020-05-30 ENCOUNTER — Other Ambulatory Visit: Payer: Self-pay

## 2020-05-30 ENCOUNTER — Telehealth: Payer: Self-pay | Admitting: Pharmacist

## 2020-05-30 ENCOUNTER — Ambulatory Visit: Payer: 59 | Admitting: Pharmacist

## 2020-05-30 DIAGNOSIS — F320 Major depressive disorder, single episode, mild: Secondary | ICD-10-CM

## 2020-05-30 DIAGNOSIS — E1129 Type 2 diabetes mellitus with other diabetic kidney complication: Secondary | ICD-10-CM

## 2020-05-30 DIAGNOSIS — Z630 Problems in relationship with spouse or partner: Secondary | ICD-10-CM

## 2020-05-30 DIAGNOSIS — Z636 Dependent relative needing care at home: Secondary | ICD-10-CM | POA: Diagnosis not present

## 2020-05-30 DIAGNOSIS — E1169 Type 2 diabetes mellitus with other specified complication: Secondary | ICD-10-CM

## 2020-05-30 DIAGNOSIS — E119 Type 2 diabetes mellitus without complications: Secondary | ICD-10-CM

## 2020-05-30 DIAGNOSIS — R69 Illness, unspecified: Secondary | ICD-10-CM

## 2020-05-30 NOTE — Progress Notes (Signed)
Psychotherapy Progress Note [facility] [name], PhD LP  Patient ID: Stefanie Stewart     MRN: [id number] Therapy format: Individual psychotherapy Date: 05/30/2020      Start: 1:10p     Stop: 2:00p     Time Spent: 50 min Location: In-person   Session narrative (presenting needs, interim history, self-report of stressors and symptoms, applications of prior therapy, status changes, and interventions made in session) Working through [employer] and taxes, trying to avoid the hassle of opening a formal estate.  Needs to work with anger at the complications left to her by international stocks M had and having her own ID tied to obligations that rightfully belong to her brother, who has dragged his feet on his need of administrative tasks.  Has put him on notice.  Working with Optometrist for first time to run taxes, worth it, just tense having so many moving parts and features of finances she's never know about, trying to make sure she does the right things.  For health, feels she is reliable about walking and diet control now.  Colonoscopy coming.  Trying to manage a hurt shoulder, takes Excedrin a couple times a day, but it's irritating her stomach.  Discussed   Re family, mostly wants to transcend complaining about how she got dumped on by F, M, B, and her "squatter" ex-BF [place].  Working on complaining less, living on more with the possibilities she has now.  Affirmed and encouraged, offered that persistent complaining usually means lack of satisfaction that one has been heard out (by others, or self).  Encouraged consider writing out her grievances as a personal testimonial if moving on isn't enough.  Once done, can also write out gratitude for the positives, then consider it said, keep the letters as evidence of settled business or burn them as a gesture of letting go.  Therapeutic modalities: Cognitive Behavioral Therapy, Solution-Oriented/Positive Psychology,  Ego-Supportive and Grief Therapy  Mental Status/Observations:  Appearance:   Casual     Behavior:  Appropriate  Motor:  Normal  Speech/Language:   Clear and Coherent  Affect:  Appropriate  Mood:  normal  Thought process:  normal  Thought content:    WNL  Sensory/Perceptual disturbances:    WNL  Orientation:  Fully oriented  Attention:  Good    Concentration:  Good  Memory:  WNL  Insight:    Fair  Judgment:   Good  Impulse Control:  Good   Risk Assessment: Danger to Self: No Self-injurious Behavior: No Danger to Others: No Physical Aggression / Violence: No Duty to Warn: No Access to Firearms a concern: No  Assessment of progress:  progressing  Diagnosis:   ICD-10-CM   1. Current mild episode of major depressive disorder without prior episode ([place])  F32.0   2. Caregiver stress  Z63.6   3. Relationship problem between partners  Z63.0   4. Type 2 diabetes mellitus without complication, without long-term current use of insulin (HCC)  E11.9   5. r/o SSRI poop-out syndrome  R69    Plan:  . As motivated, work on Cur[employer] about M, F, B, Billy dumping on her.  Option to write thank you notes, as well, for balance. . 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 Recommend get off Excedrin -- regular caffeine dosing and ASA effects on GI tract.  Consult physician or try alternate OTC pain relievers.  Topicals like Tiger  Balm OK but may need antiinflammatory/analgesic, too. . Continuing option to review meds with psychiatry . Other recommendations/advice as may be noted above . Continue to utilize previously learned skills ad lib . Maintain medication as prescribed and work faithfully with relevant prescriber(s) if any changes are desired or seem indicated . Call the clinic on-call service, present to ER, or call 911 if any life-threatening psychiatric crisis Return in about 2 weeks (around 06/13/2020) for  session(s) already scheduled. . Already scheduled visit in this office 06/17/2020.  Blanchie Serve, PhD [name], PhD LP Clinical Psychologist, [facility] [address], [place], [place] [address] [phone]

## 2020-05-30 NOTE — Telephone Encounter (Signed)
Received confirmation from Langley Adie regarding [place] patient assistance approval. Patient approved through 10/24/20.  Inez Catalina contacted [employer] and they will be shipping a three month supply to patient's home on 06/01/20. One refill remaining.

## 2020-05-30 NOTE — Progress Notes (Signed)
  Chronic Care Management   Note  05/30/2020 Name: Stefanie Stewart MRN: [id number] DOB: May 27, 1975  Reason for referral: Medication assistance Repatha  Referral source: PCP  Referral medication(s): Repatha Current insurance:[employer]  PMHx: H/o Myalgias to multiple statins.   HPI: Insurance denied Repatha PA stating patient doesn't meet criteria.   Objective: Allergies  Allergen Reactions  . Bactrim [Sulfamethoxazole-Trimethoprim] Itching    Itching (skin and throat). Denies SOB or throat closing.  . Banana Other (See Comments)    Reports lip swelling. Every now and then pt states she has nausea and vomiting.       Medication Review Findings:   patient unable to afford Repatha  Medication Assistance Findings:  Extra Help:   []  Already receiving Full Extra Help  []  Already receiving Partial Extra Help  []  Eligible based on reported income and assets  [x]  Not Eligible based on reported income and assets  Patient Assistance Programs:  1.)  Patient eligible for Repatha copay discount card. She has completed info and printed from website. Patient ineligible to use copay card while her prescription is unapproved by insurance.   Additional medication assistance options reviewed with patient as warranted:  Environmental education officer patient assistance application will be mailed to patient for completion of her portion.  She previously received help with obtaining assistance for this medication through the medication management clinic.     Plan: Patient is to provide additional documents as necessary. Patient advised this process can take 2-4 weeks.  Follow up:  CPA to follow up in 2 weeks  Junita Push. Kenton Kingfisher PharmD, [facility] [phone]

## 2020-06-02 ENCOUNTER — Telehealth: Payer: Self-pay | Admitting: Gastroenterology

## 2020-06-02 NOTE — Telephone Encounter (Signed)
Pt asked if she can have her Repatha Injection today for cholesterol- Informed yes- no issues- we did discuss Metformin - she can take it today as normal, but no Metformin tomorrow am before Colon - pt verbalized understanding

## 2020-06-03 ENCOUNTER — Encounter: Payer: Self-pay | Admitting: Gastroenterology

## 2020-06-03 ENCOUNTER — Other Ambulatory Visit: Payer: Self-pay | Admitting: Gastroenterology

## 2020-06-03 ENCOUNTER — Other Ambulatory Visit: Payer: Self-pay

## 2020-06-03 ENCOUNTER — Ambulatory Visit (AMBULATORY_SURGERY_CENTER): Payer: 59 | Admitting: Gastroenterology

## 2020-06-03 VITALS — BP 124/90 | HR 86 | Temp 97.3°F | Resp 17 | Ht 68.0 in | Wt 247.0 lb

## 2020-06-03 DIAGNOSIS — Z1211 Encounter for screening for malignant neoplasm of colon: Secondary | ICD-10-CM | POA: Diagnosis not present

## 2020-06-03 DIAGNOSIS — K621 Rectal polyp: Secondary | ICD-10-CM

## 2020-06-03 DIAGNOSIS — K635 Polyp of colon: Secondary | ICD-10-CM | POA: Diagnosis not present

## 2020-06-03 DIAGNOSIS — D125 Benign neoplasm of sigmoid colon: Secondary | ICD-10-CM

## 2020-06-03 DIAGNOSIS — D128 Benign neoplasm of rectum: Secondary | ICD-10-CM

## 2020-06-03 DIAGNOSIS — D127 Benign neoplasm of rectosigmoid junction: Secondary | ICD-10-CM | POA: Diagnosis not present

## 2020-06-03 MED ORDER — SODIUM CHLORIDE 0.9 % IV SOLN: 500.0000 mL | INTRAVENOUS | Status: DC

## 2020-06-03 NOTE — Progress Notes (Signed)
Called to room to assist during endoscopic procedure.  Patient ID and intended procedure confirmed with present staff. Received instructions for my participation in the procedure from the performing physician.  

## 2020-06-03 NOTE — Progress Notes (Signed)
PT taken to PACU. Monitors in place. VSS. Report given to RN. 

## 2020-06-03 NOTE — Op Note (Signed)
[facility] [place] Patient Name: Stefanie Stewart Procedure Date: 06/03/2020 9:46 AM MRN: [id number] Endoscopist: Ladene Artist , MD Age: 45 Referring MD:  Date of Birth: 1975-06-08 Gender: Female Account #: [id number] Procedure:                Colonoscopy Indications:              Screening for colorectal malignant neoplasm Medicines:                Monitored Anesthesia Care Procedure:                Pre-Anesthesia Assessment:                           - Prior to the procedure, a History and Physical                            was performed, and patient medications and                            allergies were reviewed. The patient's tolerance of                            previous anesthesia was also reviewed. The risks                            and benefits of the procedure and the sedation                            options and risks were discussed with the patient.                            All questions were answered, and informed consent                            was obtained. Prior Anticoagulants: The patient has                            taken no previous anticoagulant or antiplatelet                            agents. ASA Grade Assessment: II - A patient with                            mild systemic disease. After reviewing the risks                            and benefits, the patient was deemed in                            satisfactory condition to undergo the procedure.                           After obtaining informed consent, the colonoscope  was passed under direct vision. Throughout the                            procedure, the patient's blood pressure, pulse, and                            oxygen saturations were monitored continuously. The                            Colonoscope was introduced through the anus and                            advanced to the the cecum, identified by                            appendiceal orifice and  ileocecal valve. The                            ileocecal valve, appendiceal orifice, and rectum                            were photographed. The quality of the bowel                            preparation was good. The colonoscopy was performed                            without difficulty. The patient tolerated the                            procedure well. Scope In: 9:54:36 AM Scope Out: 10:10:06 AM Scope Withdrawal Time: 0 hours 13 minutes 17 seconds  Total Procedure Duration: 0 hours 15 minutes 30 seconds  Findings:                 The perianal and digital rectal examinations were                            normal.                           Three sessile polyps were found in the rectum (1)                            and sigmoid colon (2). The polyps were 5 to 6 mm in                            size. These polyps were removed with a cold snare.                            Resection and retrieval were complete.                           Internal hemorrhoids were found during  retroflexion. The hemorrhoids were small and Grade                            I (internal hemorrhoids that do not prolapse).                           The exam was otherwise without abnormality on                            direct and retroflexion views. Complications:            No immediate complications. Estimated blood loss:                            None. Estimated Blood Loss:     Estimated blood loss: none. Impression:               - Three 5 to 6 mm polyps in the rectum and in the                            sigmoid colon, removed with a cold snare. Resected                            and retrieved.                           - Internal hemorrhoids.                           - The examination was otherwise normal on direct                            and retroflexion views. Recommendation:           - Repeat colonoscopy date to be determined after                             pending pathology results are reviewed for                            surveillance based on pathology results.                           - Patient has a contact number available for                            emergencies. The signs and symptoms of potential                            delayed complications were discussed with the                            patient. Return to normal activities tomorrow.                            Written discharge instructions were provided to the  patient.                           - Resume previous diet.                           - Continue present medications.                           - Await pathology results. Ladene Artist, MD 06/03/2020 10:14:30 AM This report has been signed electronically.

## 2020-06-03 NOTE — Patient Instructions (Signed)
Information on polyps and hemorrhoids given to you today.  Await pathology results.  Resume previous diet and medications.   YOU HAD AN ENDOSCOPIC PROCEDURE TODAY AT THE [facility]:   Refer to the procedure report that was given to you for any specific questions about what was found during the examination.  If the procedure report does not answer your questions, please call your gastroenterologist to clarify.  If you requested that your care partner not be given the details of your procedure findings, then the procedure report has been included in a sealed envelope for you to review at your convenience later.  YOU SHOULD EXPECT: Some feelings of bloating in the abdomen. Passage of more gas than usual.  Walking can help get rid of the air that was put into your GI tract during the procedure and reduce the bloating. If you had a lower endoscopy (such as a colonoscopy or flexible sigmoidoscopy) you may notice spotting of blood in your stool or on the toilet paper. If you underwent a bowel prep for your procedure, you may not have a normal bowel movement for a few days.  Please Note:  You might notice some irritation and congestion in your nose or some drainage.  This is from the oxygen used during your procedure.  There is no need for concern and it should clear up in a day or so.  SYMPTOMS TO REPORT IMMEDIATELY:  Following lower endoscopy (colonoscopy or flexible sigmoidoscopy):  Excessive amounts of blood in the stool  Significant tenderness or worsening of abdominal pains  Swelling of the abdomen that is new, acute  Fever of 100F or higher   For urgent or emergent issues, a gastroenterologist can be reached at any hour by calling [phone]. Do not use MyChart messaging for urgent concerns.    DIET:  We do recommend a small meal at first, but then you may proceed to your regular diet.  Drink plenty of fluids but you should avoid alcoholic beverages for 24  hours.  ACTIVITY:  You should plan to take it easy for the rest of today and you should NOT DRIVE or use heavy machinery until tomorrow (because of the sedation medicines used during the test).    FOLLOW UP: Our staff will call the number listed on your records 48-72 hours following your procedure to check on you and address any questions or concerns that you may have regarding the information given to you following your procedure. If we do not reach you, we will leave a message.  We will attempt to reach you two times.  During this call, we will ask if you have developed any symptoms of COVID 19. If you develop any symptoms (ie: fever, flu-like symptoms, shortness of breath, cough etc.) before then, please call [phone].  If you test positive for Covid 19 in the 2 weeks post procedure, please call and report this information to us.    If any biopsies were taken you will be contacted by phone or by letter within the next 1-3 weeks.  Please call us at [phone] if you have not heard about the biopsies in 3 weeks.    SIGNATURES/CONFIDENTIALITY: You and/or your care partner have signed paperwork which will be entered into your electronic medical record.  These signatures attest to the fact that that the information above on your After Visit Summary has been reviewed and is understood.  Full responsibility of the confidentiality of this discharge information lies with you and/or   your care-partner. 

## 2020-06-08 ENCOUNTER — Telehealth: Payer: Self-pay

## 2020-06-08 NOTE — Telephone Encounter (Signed)
LVM

## 2020-06-15 ENCOUNTER — Encounter: Payer: Self-pay | Admitting: Gastroenterology

## 2020-06-17 ENCOUNTER — Ambulatory Visit (INDEPENDENT_AMBULATORY_CARE_PROVIDER_SITE_OTHER): Payer: 59 | Admitting: Psychiatry

## 2020-06-17 ENCOUNTER — Other Ambulatory Visit: Payer: Self-pay | Admitting: Nurse Practitioner

## 2020-06-17 ENCOUNTER — Telehealth: Payer: Self-pay | Admitting: Gastroenterology

## 2020-06-17 ENCOUNTER — Other Ambulatory Visit: Payer: Self-pay

## 2020-06-17 DIAGNOSIS — Z634 Disappearance and death of family member: Secondary | ICD-10-CM | POA: Diagnosis not present

## 2020-06-17 DIAGNOSIS — E119 Type 2 diabetes mellitus without complications: Secondary | ICD-10-CM | POA: Diagnosis not present

## 2020-06-17 DIAGNOSIS — F325 Major depressive disorder, single episode, in full remission: Secondary | ICD-10-CM

## 2020-06-17 DIAGNOSIS — Z1231 Encounter for screening mammogram for malignant neoplasm of breast: Secondary | ICD-10-CM

## 2020-06-17 DIAGNOSIS — R69 Illness, unspecified: Secondary | ICD-10-CM | POA: Diagnosis not present

## 2020-06-17 NOTE — Progress Notes (Signed)
Psychotherapy Progress Note [facility] [name], PhD LP  Patient ID: Stefanie Stewart     MRN: [id number] Therapy format: Individual psychotherapy Date: 06/17/2020      Start: 10:09a     Stop: 11:00a     Time Spent: 51 min Location: In-person   Session narrative (presenting needs, interim history, self-report of stressors and symptoms, applications of prior therapy, status changes, and interventions made in session) Chatty today, various subjects  Finds herself running late a good bit.  Has fallen off her beginning of the year program.  Has successfully kicked caffeine when pandemic interrupted rushed work life.  Worth staying off since she repeatedly gets told she has caffeine-related fibroids in her mammogram.  Worries about walking in a wealthy neighborhood and offending people, compulsively apologizes, but always kind to her.  Gets concerned about dogs and attackers, figures she needs to get pepper spray.  Likes to watch serial killer docudramas, gripped by how survivors got away and how well many have been able to hide in plain sight in society.  Acknowledged entertainment value, lightly cautioned to be aware if it makes her lose sleep, crave a weapon, or mistrust people she really could trust.  Today, finalizing taxes, a big relief and last real work to do for [employer].  Still to see whether brother works through his responsibilities to make sure his stock inheritance is in his name instead of hers.  Considering side work and ways to get out and circulate more, freed now by not having to reckon with [facility] or care for mother any more.  Discussed possibilities.  EHR notes recent colonoscopy w/ removal of three polyps.  Has not been able to find her results in patient portal.  Blood sugar.  Has cut back Excedrin, still uses lots of Tiger Balm.  Hx of overuse of ibuprofen years ago, no known damage to kidneys.  No faith in Tylenol.  Educated about antiinflammatory topicals  like Voltaren.  Did not cover homework last time about processing grievances -- presumably no longer an issue.  Therapeutic modalities: Cognitive Behavioral Therapy, Solution-Oriented/Positive Psychology, Ego-Supportive and health care problemsolving  Mental Status/Observations:  Appearance:   Casual     Behavior:  Appropriate  Motor:  Normal  Speech/Language:   Clear and Coherent  Affect:  Appropriate  Mood:  normal  Thought process:  normal, mild flight  Thought content:    WNL  Sensory/Perceptual disturbances:    WNL  Orientation:  Fully oriented  Attention:  Good    Concentration:  Good  Memory:  WNL  Insight:    Good  Judgment:   Good  Impulse Control:  Good   Risk Assessment: Danger to Self: No Self-injurious Behavior: No Danger to Others: No Physical Aggression / Violence: No Duty to Warn: No Access to Firearms a concern: No  Assessment of progress:  progressing  Diagnosis:   ICD-10-CM   1. Major depressive disorder, single episode, in remission ([place])  F32.5   2. Type 2 diabetes mellitus without complication, without long-term current use of insulin (HCC)  E11.9   3. r/o SSRI poop-out syndrome  R69   4. Uncomplicated bereavement  Z63.4    Plan:  . Continue physical program for fitness and diabetes control . Look further into combination diabetes, weigh-loss, and antiinflammatory diet  Recommend get off Excedrin -- regular caffeine dosing and ASA effects on GI tract.  Consult physician or try alternate OTC pain relievers.  Consider Voltaren OTC.  Continuing option to review meds with psychiatry . Other recommendations/advice as may be noted above . Continue to utilize previously learned skills ad lib . Maintain medication as prescribed and work faithfully with relevant prescriber(s) if any changes are desired or seem indicated . Call the clinic on-call service, present to ER, or call 911 if any life-threatening psychiatric crisis Return in about 1 month  (around 07/18/2020). . Already scheduled visit in this office 07/01/2020.  Blanchie Serve, PhD [name], PhD LP Clinical Psychologist, [facility] [address] [place], [place] [address] [phone]

## 2020-06-17 NOTE — Telephone Encounter (Signed)
The patient has been notified of this information and all questions answered.    06/15/2020 MRN: [id number]   Stefanie Stewart [address] [place]  [address]  Dear [name],  I am writing to inform you that the polyps removed from your colon were NOT precancerous.   I recommend that you have a repeat colonoscopy exam in 10 years for routine colorectal screening.   Should you develop new or worsening symptoms of abdominal pain, bowel habit changes, or bleeding form the rectum or bowels, please schedule an evaluation either with me or your primary care physician.   Please call us if you have persistent problems or have questions about your condition that have not been fully answered at this time.  Sincerely,   Ladene Artist, MD

## 2020-06-20 ENCOUNTER — Encounter: Payer: Self-pay | Admitting: Nurse Practitioner

## 2020-06-20 ENCOUNTER — Other Ambulatory Visit: Payer: Self-pay

## 2020-06-20 ENCOUNTER — Ambulatory Visit (INDEPENDENT_AMBULATORY_CARE_PROVIDER_SITE_OTHER): Payer: 59 | Admitting: Nurse Practitioner

## 2020-06-20 VITALS — BP 127/80 | HR 80 | Temp 97.4°F | Wt 240.0 lb

## 2020-06-20 DIAGNOSIS — E1129 Type 2 diabetes mellitus with other diabetic kidney complication: Secondary | ICD-10-CM | POA: Diagnosis not present

## 2020-06-20 DIAGNOSIS — R809 Proteinuria, unspecified: Secondary | ICD-10-CM

## 2020-06-20 DIAGNOSIS — E1169 Type 2 diabetes mellitus with other specified complication: Secondary | ICD-10-CM

## 2020-06-20 DIAGNOSIS — M25512 Pain in left shoulder: Secondary | ICD-10-CM

## 2020-06-20 DIAGNOSIS — T466X5A Adverse effect of antihyperlipidemic and antiarteriosclerotic drugs, initial encounter: Secondary | ICD-10-CM

## 2020-06-20 DIAGNOSIS — E785 Hyperlipidemia, unspecified: Secondary | ICD-10-CM

## 2020-06-20 DIAGNOSIS — F320 Major depressive disorder, single episode, mild: Secondary | ICD-10-CM

## 2020-06-20 DIAGNOSIS — M791 Myalgia, unspecified site: Secondary | ICD-10-CM

## 2020-06-20 DIAGNOSIS — E1159 Type 2 diabetes mellitus with other circulatory complications: Secondary | ICD-10-CM

## 2020-06-20 DIAGNOSIS — F17210 Nicotine dependence, cigarettes, uncomplicated: Secondary | ICD-10-CM

## 2020-06-20 DIAGNOSIS — I152 Hypertension secondary to endocrine disorders: Secondary | ICD-10-CM

## 2020-06-20 NOTE — Progress Notes (Signed)
BP 127/80   Pulse 80   Temp (!) 97.4 F (36.3 C) (Oral)   Wt 240 lb (108.9 kg)   LMP  (LMP Unknown) Comment: does not have cycles  SpO2 97%   BMI 36.49 kg/m    Subjective:    Patient ID: Stefanie Stewart, female    DOB: 01-05-1976, 45 y.o.   MRN: [id number]  HPI: Stefanie Stewart is a 45 y.o. female  Chief Complaint  Patient presents with  . Diabetes  . Hyperlipidemia  . Hypertension  . Shoulder Pain    Patient states she would like to discuss being referred to a specialist for shoulder pain (left). Patient states she wants to stop taking so much Excedrin.   DIABETES A1c in December was 11.3 and urine ALB 150 -- started on Metformin 1000 MG BID -- currently is taking 1000 MG in morning and 1000 MG at night -- did have some diarrhea with this that is now improving.  She was also started on Losartan 25 MG for BP and albuminuria.  Has attended diabetic education class x 2. Hypoglycemic episodes:no Polydipsia/polyuria: no Visual disturbance: no Chest pain: no Paresthesias: no Glucose Monitoring: yes  Accucheck frequency: Daily  Fasting glucose: Averaging 137 to 155  Post prandial:  Evening:  Before meals: Taking Insulin?: no  Long acting insulin:  Short acting insulin: Blood Pressure Monitoring: not checking Retinal Examination: Up to Date -- in October Foot Exam: Up to Date Diabetic Education: yes has attended 2 Pneumovax: refused Influenza: refused Aspirin: no   HYPERTENSION / HYPERLIPIDEMIA Currently no blood pressure medications -- was originally given HCTZ due to edema, she is taking Repatha for HLD -- as did not tolerate statins, even on 1 or 3 day a week schedule.  Repatha has shown to be very beneficial at lowering levels and she has been tolerating well.  She does continue to smoke cigarettes daily. Satisfied with current treatment? yes Duration of hypertension: chronic BP monitoring frequency: not checking BP range:  BP medication side effects:  no Duration of hyperlipidemia: chronic Cholesterol medication side effects: no Cholesterol supplements: none Medication compliance: fair compliance - at times forgets Aspirin: no Recent stressors: no Recurrent headaches: no Visual changes: no Palpitations: no Dyspnea: no Chest pain: no Lower extremity edema: no Dizzy/lightheaded: no  The 10-year ASCVD risk score Mikey Bussing DC Jr., et al., 2013) is: 8.6%   Values used to calculate the score:     Age: 66 years     Sex: Female     Is Non-Hispanic African American: No     Diabetic: Yes     Tobacco smoker: No     Systolic Blood Pressure: 532 mmHg     Is BP treated: Yes     HDL Cholesterol: 25 mg/dL     Total Cholesterol: 223 mg/dL   SHOULDER PAIN Reports ongoing left shoulder pain for several months with lifting arm up.  Her muscle is very tight through there.  Fluctuates in discomfort.  Taking Excedrin frequently.  Injured it in August 2021 -- handing a heavy tub of supplies at home and it caused irritation to left shoulder. Duration: months Involved shoulder: left Mechanism of injury: unknown Location: posterior Onset:gradual Severity: 10/10 at worst Quality: aching and dull Frequency:intermittent Radiation: no Aggravating factors: movement  Alleviating factors: massage, muscle relaxer Status: stable Treatments attempted: muscle relaxer, Tylenol, Ibuprofen, OTC creams, massage therapy, chiropractor Relief with NSAIDs?:  moderate Weakness: no Numbness: no Decreased grip strength: no Redness: no Swelling:  no Bruising: no Fevers: no  Relevant past medical, surgical, family and social history reviewed and updated as indicated. Interim medical history since our last visit reviewed. Allergies and medications reviewed and updated.  Review of Systems  Constitutional: Negative for activity change, appetite change, diaphoresis, fatigue and fever.  Respiratory: Negative for cough, chest tightness and shortness of breath.    Cardiovascular: Negative for chest pain, palpitations and leg swelling.  Gastrointestinal: Negative.   Endocrine: Negative for polydipsia, polyphagia and polyuria.  Neurological: Negative.   Psychiatric/Behavioral: Negative.     Per HPI unless specifically indicated above     Objective:    BP 127/80   Pulse 80   Temp (!) 97.4 F (36.3 C) (Oral)   Wt 240 lb (108.9 kg)   LMP  (LMP Unknown) Comment: does not have cycles  SpO2 97%   BMI 36.49 kg/m   Wt Readings from Last 3 Encounters:  06/20/20 240 lb (108.9 kg)  06/03/20 247 lb (112 kg)  05/20/20 247 lb (112 kg)    Physical Exam Vitals and nursing note reviewed.  Constitutional:      General: She is awake. She is not in acute distress.    Appearance: She is well-developed and well-groomed. She is obese. She is not ill-appearing.  HENT:     Head: Normocephalic.     Right Ear: Hearing normal.     Left Ear: Hearing normal.  Eyes:     General: Lids are normal.        Right eye: No discharge.        Left eye: No discharge.     Conjunctiva/sclera: Conjunctivae normal.     Pupils: Pupils are equal, round, and reactive to light.  Neck:     Thyroid: No thyromegaly.     Vascular: No carotid bruit.  Cardiovascular:     Rate and Rhythm: Normal rate and regular rhythm.     Heart sounds: Normal heart sounds. No murmur heard. No gallop.   Pulmonary:     Effort: Pulmonary effort is normal. No accessory muscle usage or respiratory distress.     Breath sounds: Normal breath sounds.  Abdominal:     General: Bowel sounds are normal.     Palpations: Abdomen is soft. There is no hepatomegaly or splenomegaly.  Musculoskeletal:     Right shoulder: Normal.     Left shoulder: No swelling, effusion, laceration, tenderness, bony tenderness or crepitus. Normal range of motion. Normal strength.     Cervical back: Normal range of motion and neck supple.     Right lower leg: No edema.     Left lower leg: No edema.  Lymphadenopathy:      Cervical: No cervical adenopathy.  Skin:    General: Skin is warm and dry.  Neurological:     Mental Status: She is alert and oriented to person, place, and time.  Psychiatric:        Attention and Perception: Attention normal.        Mood and Affect: Mood normal.        Speech: Speech normal.        Behavior: Behavior normal. Behavior is cooperative.        Thought Content: Thought content normal.    Results for orders placed or performed in visit on 03/24/20  Comprehensive metabolic panel  Result Value Ref Range   Glucose 290 (H) 65 - 99 mg/dL   BUN 15 6 - 24 mg/dL   Creatinine, Ser 0.64 0.57 -  1.00 mg/dL   GFR calc non Af Amer 107 >59 mL/min/1.73   GFR calc Af Amer 124 >59 mL/min/1.73   BUN/Creatinine Ratio 23 9 - 23   Sodium 134 134 - 144 mmol/L   Potassium 4.5 3.5 - 5.2 mmol/L   Chloride 97 96 - 106 mmol/L   CO2 21 20 - 29 mmol/L   Calcium 9.7 8.7 - 10.2 mg/dL   Total Protein 7.7 6.0 - 8.5 g/dL   Albumin 4.8 3.8 - 4.8 g/dL   Globulin, Total 2.9 1.5 - 4.5 g/dL   Albumin/Globulin Ratio 1.7 1.2 - 2.2   Bilirubin Total 0.9 0.0 - 1.2 mg/dL   Alkaline Phosphatase 67 44 - 121 IU/L   AST 37 0 - 40 IU/L   ALT 62 (H) 0 - 32 IU/L  Bayer DCA Hb A1c Waived  Result Value Ref Range   HB A1C (BAYER DCA - WAIVED) 11.3 (H) <7.0 %  Microalbumin, Urine Waived  Result Value Ref Range   Microalb, Ur Waived 150 (H) 0 - 19 mg/L   Creatinine, Urine Waived 200 10 - 300 mg/dL   Microalb/Creat Ratio >300 (H) <30 mg/g  Lipid Panel w/o Chol/HDL Ratio  Result Value Ref Range   Cholesterol, Total 223 (H) 100 - 199 mg/dL   Triglycerides 744 (HH) 0 - 149 mg/dL   HDL 25 (L) >39 mg/dL   VLDL Cholesterol Cal 119 (H) 5 - 40 mg/dL   LDL Chol Calc (NIH) 79 0 - 99 mg/dL      Assessment & Plan:   Problem List Items Addressed This Visit      Cardiovascular and Mediastinum   Hypertension associated with diabetes (HCC) (Chronic)    Chronic, stable with BP improving with Losartan addition -- urine  ALB 80 today, downward from previous 150.  At this continue Losartan 25 MG daily and then adjust in future as needed.  Discussed benefit of ARB for kidney protection with proteinuria.  Recommend she monitor BP at least a few mornings a week at home and document.  DASH diet at home.  Labs today CMP, recent K+ 4.5.  Return in 3 months.       Relevant Orders   Bayer DCA Hb A1c Waived   Microalbumin, Urine Waived     Endocrine   Hyperlipidemia associated with type 2 diabetes mellitus (HCC) (Chronic)    Chronic, ongoing.  History of poor tolerance statin.  Continue Repatha at this time, which she is tolerating.  Lipid panel today.  Return in 3 months.      Relevant Orders   Comprehensive metabolic panel   Lipid Panel w/o Chol/HDL Ratio   Type 2 diabetes mellitus with morbid obesity (HCC) (Chronic)    Refer to diabetes with proteinuria plan.      Type 2 diabetes mellitus with proteinuria (HCC) - Primary (Chronic)    New diagnosis A1c 11.3 end of December, today A1c much improved at 7.3%, praised for success.  Urine ALB 150 last visit and today improved at 80.  Continue Metformin at max dose 1000 MG BID.  Continue Losartan 25 MG daily for kidney protection and continue Repatha for cholesterol reduction.  Continue diabetes education.  Educated on blood sugar goals, less then 130 fasting in morning and <180 two hours after a meal.  Will have her return in June for A1c check.  If poor tolerance of Metformin at max dose could consider a GLP1, which would also benefit weight loss.  Referral to weight management.  Return in 3 months.      Relevant Orders   Bayer DCA Hb A1c Waived   Microalbumin, Urine Waived     Other   Depression (Chronic)    Chronic, ongoing.  Continue current medication regimen and adjust as needed.  Denies SI/HI. Return to office in 3 months.      Morbid obesity ([place])    Current BMI 36.49 with T2DM and HTN.  She has lost over 20 pounds over past year.  Recommended eating  smaller high protein, low fat meals more frequently and exercising 30 mins a day 5 times a week with a goal of 10-15lb weight loss in the next 3 months. Patient voiced their understanding and motivation to adhere to these recommendations. Weight management referral as she wishes to discuss bariatric surgery.       Relevant Orders   Amb Ref to Medical Weight Management   Nicotine dependence, cigarettes, uncomplicated    I have recommended complete cessation of tobacco use. I have discussed various options available for assistance with tobacco cessation including over the counter methods (Nicotine gum, patch and lozenges). We also discussed prescription options (Chantix, Nicotine Inhaler / Nasal Spray). The patient is not interested in pursuing any prescription tobacco cessation options at this time.  Determine whether to start lung CT screening annually at age 46.       Myalgia due to statin    History of myalgia, even with 3 day a week Crestor.  At this time continue Repatha, which she is tolerating well, for lipid lowering and stroke prevention.  Have seen great benefit in LDL levels with this.      Acute pain of left shoulder    Acute with waxing and waning over past months. Normal exam today with no weakness and full ROM present.  Recommend continue OTC medication regimen, including Voltaren gel and Icy/Hot as needed.  Continue massage therapy and alternating heat and ice as needed.  Referral to ortho.        Relevant Orders   Ambulatory referral to Orthopedics       Follow up plan: Return in about 3 months (around 09/20/2020) for T2DM, HTN/hLD, COPD.

## 2020-06-20 NOTE — Assessment & Plan Note (Signed)
History of myalgia, even with 3 day a week Crestor.  At this time continue Repatha, which she is tolerating well, for lipid lowering and stroke prevention.  Have seen great benefit in LDL levels with this.

## 2020-06-20 NOTE — Assessment & Plan Note (Signed)
Chronic, stable with BP improving with Losartan addition -- urine ALB 80 today, downward from previous 150.  At this continue Losartan 25 MG daily and then adjust in future as needed.  Discussed benefit of ARB for kidney protection with proteinuria.  Recommend she monitor BP at least a few mornings a week at home and document.  DASH diet at home.  Labs today CMP, recent K+ 4.5.  Return in 3 months.

## 2020-06-20 NOTE — Assessment & Plan Note (Signed)
Refer to diabetes with proteinuria plan. 

## 2020-06-20 NOTE — Assessment & Plan Note (Signed)
Chronic, ongoing.  History of poor tolerance statin.  Continue Repatha at this time, which she is tolerating.  Lipid panel today.  Return in 3 months.

## 2020-06-20 NOTE — Assessment & Plan Note (Signed)
Acute with waxing and waning over past months. Normal exam today with no weakness and full ROM present.  Recommend continue OTC medication regimen, including Voltaren gel and Icy/Hot as needed.  Continue massage therapy and alternating heat and ice as needed.  Referral to ortho.

## 2020-06-20 NOTE — Assessment & Plan Note (Signed)
New diagnosis A1c 11.3 end of December, today A1c much improved at 7.3%, praised for success.  Urine ALB 150 last visit and today improved at 80.  Continue Metformin at max dose 1000 MG BID.  Continue Losartan 25 MG daily for kidney protection and continue Repatha for cholesterol reduction.  Continue diabetes education.  Educated on blood sugar goals, less then 130 fasting in morning and <180 two hours after a meal.  Will have her return in June for A1c check.  If poor tolerance of Metformin at max dose could consider a GLP1, which would also benefit weight loss.  Referral to weight management.  Return in 3 months.

## 2020-06-20 NOTE — Assessment & Plan Note (Signed)
I have recommended complete cessation of tobacco use. I have discussed various options available for assistance with tobacco cessation including over the counter methods (Nicotine gum, patch and lozenges). We also discussed prescription options (Chantix, Nicotine Inhaler / Nasal Spray). The patient is not interested in pursuing any prescription tobacco cessation options at this time.  Determine whether to start lung CT screening annually at age 45.  

## 2020-06-20 NOTE — Assessment & Plan Note (Addendum)
Current BMI 36.49 with T2DM and HTN.  She has lost over 20 pounds over past year.  Recommended eating smaller high protein, low fat meals more frequently and exercising 30 mins a day 5 times a week with a goal of 10-15lb weight loss in the next 3 months. Patient voiced their understanding and motivation to adhere to these recommendations. Weight management referral as she wishes to discuss bariatric surgery.

## 2020-06-20 NOTE — Patient Instructions (Signed)
Diabetes Mellitus and Nutrition, Adult When you have diabetes, or diabetes mellitus, it is very important to have healthy eating habits because your blood sugar (glucose) levels are greatly affected by what you eat and drink. Eating healthy foods in the right amounts, at about the same times every day, can help you:  Control your blood glucose.  Lower your risk of heart disease.  Improve your blood pressure.  Reach or maintain a healthy weight. What can affect my meal plan? Every person with diabetes is different, and each person has different needs for a meal plan. Your health care provider may recommend that you work with a dietitian to make a meal plan that is best for you. Your meal plan may vary depending on factors such as:  The calories you need.  The medicines you take.  Your weight.  Your blood glucose, blood pressure, and cholesterol levels.  Your activity level.  Other health conditions you have, such as heart or kidney disease. How do carbohydrates affect me? Carbohydrates, also called carbs, affect your blood glucose level more than any other type of food. Eating carbs naturally raises the amount of glucose in your blood. Carb counting is a method for keeping track of how many carbs you eat. Counting carbs is important to keep your blood glucose at a healthy level, especially if you use insulin or take certain oral diabetes medicines. It is important to know how many carbs you can safely have in each meal. This is different for every person. Your dietitian can help you calculate how many carbs you should have at each meal and for each snack. How does alcohol affect me? Alcohol can cause a sudden decrease in blood glucose (hypoglycemia), especially if you use insulin or take certain oral diabetes medicines. Hypoglycemia can be a life-threatening condition. Symptoms of hypoglycemia, such as sleepiness, dizziness, and confusion, are similar to symptoms of having too much  alcohol.  Do not drink alcohol if: ? Your health care provider tells you not to drink. ? You are pregnant, may be pregnant, or are planning to become pregnant.  If you drink alcohol: ? Do not drink on an empty stomach. ? Limit how much you use to:  0-1 drink a day for women.  0-2 drinks a day for men. ? Be aware of how much alcohol is in your drink. In the [place] one drink equals one 12 oz bottle of beer (355 mL), one 5 oz glass of wine (148 mL), or one 1 oz glass of hard liquor (44 mL). ? Keep yourself hydrated with water, diet soda, or unsweetened iced tea.  Keep in mind that regular soda, juice, and other mixers may contain a lot of sugar and must be counted as carbs. What are tips for following this plan? Reading food labels  Start by checking the serving size on the "Nutrition Facts" label of packaged foods and drinks. The amount of calories, carbs, fats, and other nutrients listed on the label is based on one serving of the item. Many items contain more than one serving per package.  Check the total grams (g) of carbs in one serving. You can calculate the number of servings of carbs in one serving by dividing the total carbs by 15. For example, if a food has 30 g of total carbs per serving, it would be equal to 2 servings of carbs.  Check the number of grams (g) of saturated fats and trans fats in one serving. Choose foods that have   a low amount or none of these fats.  Check the number of milligrams (mg) of salt (sodium) in one serving. Most people should limit total sodium intake to less than 2,300 mg per day.  Always check the nutrition information of foods labeled as "low-fat" or "nonfat." These foods may be higher in added sugar or refined carbs and should be avoided.  Talk to your dietitian to identify your daily goals for nutrients listed on the label. Shopping  Avoid buying canned, pre-made, or processed foods. These foods tend to be high in fat, sodium, and added  sugar.  Shop around the outside edge of the grocery store. This is where you will most often find fresh fruits and vegetables, bulk grains, fresh meats, and fresh dairy. Cooking  Use low-heat cooking methods, such as baking, instead of high-heat cooking methods like deep frying.  Cook using healthy oils, such as olive, canola, or sunflower oil.  Avoid cooking with butter, cream, or high-fat meats. Meal planning  Eat meals and snacks regularly, preferably at the same times every day. Avoid going long periods of time without eating.  Eat foods that are high in fiber, such as fresh fruits, vegetables, beans, and whole grains. Talk with your dietitian about how many servings of carbs you can eat at each meal.  Eat 4-6 oz (112-168 g) of lean protein each day, such as lean meat, chicken, fish, eggs, or tofu. One ounce (oz) of lean protein is equal to: ? 1 oz (28 g) of meat, chicken, or fish. ? 1 egg. ?  cup (62 g) of tofu.  Eat some foods each day that contain healthy fats, such as avocado, nuts, seeds, and fish.   What foods should I eat? Fruits Berries. Apples. Oranges. Peaches. Apricots. Plums. Grapes. Mango. Papaya. Pomegranate. Kiwi. Cherries. Vegetables Lettuce. Spinach. Leafy greens, including kale, chard, collard greens, and mustard greens. Beets. Cauliflower. Cabbage. Broccoli. Carrots. Green beans. Tomatoes. Peppers. Onions. Cucumbers. Brussels sprouts. Grains Whole grains, such as whole-wheat or whole-grain bread, crackers, tortillas, cereal, and pasta. Unsweetened oatmeal. Quinoa. Brown or wild rice. Meats and other proteins Seafood. Poultry without skin. Lean cuts of poultry and beef. Tofu. Nuts. Seeds. Dairy Low-fat or fat-free dairy products such as milk, yogurt, and cheese. The items listed above may not be a complete list of foods and beverages you can eat. Contact a dietitian for more information. What foods should I avoid? Fruits Fruits canned with  syrup. Vegetables Canned vegetables. Frozen vegetables with butter or cream sauce. Grains Refined white flour and flour products such as bread, pasta, snack foods, and cereals. Avoid all processed foods. Meats and other proteins Fatty cuts of meat. Poultry with skin. Breaded or fried meats. Processed meat. Avoid saturated fats. Dairy Full-fat yogurt, cheese, or milk. Beverages Sweetened drinks, such as soda or iced tea. The items listed above may not be a complete list of foods and beverages you should avoid. Contact a dietitian for more information. Questions to ask a health care provider  Do I need to meet with a diabetes educator?  Do I need to meet with a dietitian?  What number can I call if I have questions?  When are the best times to check my blood glucose? Where to find more information:  American Diabetes Association: diabetes.org  Academy of Nutrition and Dietetics: www.eatright.org  National Institute of Diabetes and Digestive and Kidney Diseases: www.niddk.nih.gov  Association of Diabetes Care and Education Specialists: www.diabeteseducator.org Summary  It is important to have healthy eating   habits because your blood sugar (glucose) levels are greatly affected by what you eat and drink.  A healthy meal plan will help you control your blood glucose and maintain a healthy lifestyle.  Your health care provider may recommend that you work with a dietitian to make a meal plan that is best for you.  Keep in mind that carbohydrates (carbs) and alcohol have immediate effects on your blood glucose levels. It is important to count carbs and to use alcohol carefully. This information is not intended to replace advice given to you by your health care provider. Make sure you discuss any questions you have with your health care provider. Document Revised: 02/17/2019 Document Reviewed: 02/17/2019 Elsevier Patient Education  2021 Elsevier Inc.  

## 2020-06-20 NOTE — Assessment & Plan Note (Signed)
Chronic, ongoing.  Continue current medication regimen and adjust as needed.  Denies SI/HI. Return to office in 3 months. 

## 2020-06-21 LAB — COMPREHENSIVE METABOLIC PANEL
ALT: 25 IU/L (ref 0–32)
AST: 9 IU/L (ref 0–40)
Albumin/Globulin Ratio: 1.7 (ref 1.2–2.2)
Albumin: 4.5 g/dL (ref 3.8–4.8)
Alkaline Phosphatase: 49 IU/L (ref 44–121)
BUN/Creatinine Ratio: 17 (ref 9–23)
BUN: 12 mg/dL (ref 6–24)
Bilirubin Total: 0.4 mg/dL (ref 0.0–1.2)
CO2: 21 mmol/L (ref 20–29)
Calcium: 9.6 mg/dL (ref 8.7–10.2)
Chloride: 104 mmol/L (ref 96–106)
Creatinine, Ser: 0.72 mg/dL (ref 0.57–1.00)
Globulin, Total: 2.7 g/dL (ref 1.5–4.5)
Glucose: 175 mg/dL — ABNORMAL HIGH (ref 65–99)
Potassium: 4.5 mmol/L (ref 3.5–5.2)
Sodium: 142 mmol/L (ref 134–144)
Total Protein: 7.2 g/dL (ref 6.0–8.5)
eGFR: 104 mL/min/{1.73_m2} (ref >59)

## 2020-06-21 LAB — LIPID PANEL W/O CHOL/HDL RATIO
Cholesterol, Total: 114 mg/dL (ref 100–199)
HDL: 25 mg/dL — ABNORMAL LOW (ref >39)
LDL Chol Calc (NIH): 55 mg/dL (ref 0–99)
Triglycerides: 210 mg/dL — ABNORMAL HIGH (ref 0–149)
VLDL Cholesterol Cal: 34 mg/dL (ref 5–40)

## 2020-06-21 LAB — MICROALBUMIN, URINE WAIVED
Creatinine, Urine Waived: 300 mg/dL (ref 10–300)
Microalb, Ur Waived: 80 mg/L — ABNORMAL HIGH (ref 0–19)

## 2020-06-21 LAB — BAYER DCA HB A1C WAIVED: HB A1C (BAYER DCA - WAIVED): 7.3 % — ABNORMAL HIGH (ref <7.0)

## 2020-06-21 NOTE — Progress Notes (Signed)
Contacted via [place] afternoon [place], your labs have returned and wow!!  Liver function testing, AST and ALT, now normal.  Glucose is trending down with Metformin on board.  Kidney function, creatinine and eGFR, are normal.  Look at those cholesterol levels with Repatha though!!  LDL is now 55, triglycerides are coming down to 210, total cholesterol 114.  This is working Retail banker for you for prevention of stroke or heart issues.  Great job!! Keep being awesome!!  Thank you for allowing me to participate in your care. Kindest regards, Emira Eubanks

## 2020-06-27 ENCOUNTER — Encounter: Payer: 59 | Attending: Nurse Practitioner | Admitting: Dietician

## 2020-06-27 ENCOUNTER — Other Ambulatory Visit: Payer: Self-pay

## 2020-06-27 ENCOUNTER — Encounter: Payer: Self-pay | Admitting: Dietician

## 2020-06-27 DIAGNOSIS — E1169 Type 2 diabetes mellitus with other specified complication: Secondary | ICD-10-CM | POA: Diagnosis present

## 2020-06-27 NOTE — Patient Instructions (Addendum)
Consider bringing fruit and sugar-freecool whip to your gathering for a healthier dessert item.  Buy pre-portioned snack packs of nuts.  Choose low or reduced fat dairy products to lower saturated fat and calories.  Look into zucchini lasagna recipes.  MyFitnessPal app!

## 2020-06-27 NOTE — Progress Notes (Signed)
Diabetes Self-Management Education  Visit Type: Follow-up  Appt. Start Time: 1400 Appt. End Time: 1440  06/27/2020  [name], identified by name and date of birth, is a 45 y.o. female with a diagnosis of Diabetes: Type 2.   ASSESSMENT Pt reports being discouraged about weight loss. Pt reports increasing walking, yard work, and changed their diet. Pt reports getting targeted ads on their weight loss on their phone and feel like they should have lost more weight. Pt reports still emotional eating. Pt reports stress eating at 2 am last night due to back pain keeping them awake.Pt reports feeling like a "drug addict" in the evenings when cravings for carbs, and is concerned that those cravings will never go away. Pt reports avoiding carbs still. Pt reports multiple upcoming gatherings that will be involving unhealthy foods and is concerned about being able to eat healthy there.  Pt reports snacking on 100 calorie veggie sticks to control portions. Pt reports having spaghetti squash and really enjoying it. FBGs - 140s Pt A1c down to 7.3 from 11.3 3 months ago. Pt also reports dramatic improvements in blood lipids and liver enzyme labs.   Height 5\' 8"  (1.727 m), weight 240 lb 9.6 oz (109.1 kg). Body mass index is 36.58 kg/m.   Diabetes Self-Management Education - 06/27/20 1400      Visit Information   Visit Type Follow-up      Initial Visit   Diabetes Type Type 2    Are you taking your medications as prescribed? Yes      Psychosocial Assessment   Patient Belief/Attitude about Diabetes Other (comment)   confusion as to how to eat     Pre-Education Assessment   Patient understands the diabetes disease and treatment process. Needs Review    Patient understands incorporating nutritional management into lifestyle. Needs Review    Patient undertands incorporating physical activity into lifestyle. Needs Review    Patient understands using medications safely. Needs Review    Patient  understands monitoring blood glucose, interpreting and using results Needs Review    Patient understands prevention, detection, and treatment of acute complications. Needs Review    Patient understands prevention, detection, and treatment of chronic complications. Needs Review    Patient understands how to develop strategies to address psychosocial issues. Needs Review    Patient understands how to develop strategies to promote health/change behavior. Needs Review      Complications   Last HgB A1C per patient/outside source 7.3 %      Subsequent Visit   Since your last visit have you continued or begun to take your medications as prescribed? Yes    Since your last visit have you had your blood pressure checked? Yes    Is your most recent blood pressure lower, unchanged, or higher since your last visit? Unchanged    Since your last visit have you experienced any weight changes? Loss    Weight Loss (lbs) 3    Since your last visit, are you checking your blood glucose at least once a day? Yes           Individualized Plan for Diabetes Self-Management Training:   Learning Objective:  Patient will have a greater understanding of diabetes self-management. Patient education plan is to attend individual and/or group sessions per assessed needs and concerns.   Plan:   Patient Instructions  Consider bringing fruit and sugar-freecool whip to your gathering for a healthier dessert item.  Buy pre-portioned snack packs of nuts.  Choose  low or reduced fat dairy products to lower saturated fat and calories.  Look into zucchini lasagna recipes.  MyFitnessPal app!   Expected Outcomes:     Education material provided: Saturated Fats Worksheet  If problems or questions, patient to contact team via:  Email  Future DSME appointment:

## 2020-07-01 ENCOUNTER — Ambulatory Visit: Payer: 59 | Admitting: Psychiatry

## 2020-07-11 ENCOUNTER — Ambulatory Visit (INDEPENDENT_AMBULATORY_CARE_PROVIDER_SITE_OTHER): Payer: 59 | Admitting: Family Medicine

## 2020-07-11 ENCOUNTER — Other Ambulatory Visit: Payer: Self-pay

## 2020-07-11 ENCOUNTER — Encounter: Payer: Self-pay | Admitting: Family Medicine

## 2020-07-11 ENCOUNTER — Ambulatory Visit: Payer: 59 | Admitting: Orthopaedic Surgery

## 2020-07-11 ENCOUNTER — Ambulatory Visit: Payer: Self-pay

## 2020-07-11 DIAGNOSIS — G8929 Other chronic pain: Secondary | ICD-10-CM | POA: Diagnosis not present

## 2020-07-11 DIAGNOSIS — M25512 Pain in left shoulder: Secondary | ICD-10-CM | POA: Diagnosis not present

## 2020-07-11 NOTE — Progress Notes (Signed)
Office Visit Note   Patient: Stefanie Stewart           Date of Birth: Feb 14, 1976           MRN: [id number] Visit Date: 07/11/2020 Requested by: [name], NP [place],  [place] [id number] PCP: [name], NP  Subjective: Chief Complaint  Patient presents with  . Left Shoulder - Injury, Pain    In August of 2021, she was on the receiving end of a heavy container being handed down from the attic. She did not have insurance back then, plus she had just been diagnosed with DM, so it went on the back burner. Continues to have pain in the shoulder. The ROM is better, but not pain-free to reach across her front. Doing yard work irritates the shoulder. Has tried otc oral meds and topical meds. Works as a Art therapist. Right-hand dominant. Sees a chiropractor and gets monthly massages.     HPI: She is here with left shoulder pain.  She is right-hand dominant.  In August she was reaching up to catch a container being handed down to her as she felt immediate pain in her shoulder.  It has been bothering her since then.  She has been working with her chiropractor mostly on neck and back, but it does not seem to be helping her shoulder.  It hurts to reach across her body.  It hurts to do yard work and also hurts when she works as a Art therapist.  Anti-inflammatories give only temporary relief.              ROS:   All other systems were reviewed and are negative.  Objective: Vital Signs: There were no vitals taken for this visit.  Physical Exam:  General:  Alert and oriented, in no acute distress. Pulm:  Breathing unlabored. Psy:  Normal mood, congruent affect. Skin: No rash Left shoulder: Full active range of motion.  Negative AC crossover test.  Speeds test negative.  Isometric rotator cuff strength is 5/5 throughout.  She does have some pain with empty can test.  Imaging: US Guided Needle Placement  Result Date: 07/11/2020 Diagnostic ultrasound reveals intact long  head biceps tendon.  Subscap tendon is intact with no tears.  Supraspinatus is slightly thickened and there is some fluid in the subacromial/subdeltoid bursa.  Infraspinatus looks intact.  AC joint is slightly arthritic.   Assessment & Plan: 1.  Left shoulder impingement -Discussed with patient and she wants a subacromial injection today.  If this does not help, then physical therapy.  If that does not help, then MRI scan.  If shoulder MRI is unremarkable, then cervical spine work-up.  Home exercises given as well.     Procedures: After sterile prep with Betadine, injected 3 cc 0.25% bupivacaine and 40 mg Depo-Medrol using ultrasound to guide the needle into the subacromial bursa.    PMFS History: Patient Active Problem List   Diagnosis Date Noted  . Type 2 diabetes mellitus with morbid obesity ([place]) 03/24/2020  . Type 2 diabetes mellitus with proteinuria ([place]) 03/24/2020  . Acute pain of left shoulder 03/24/2020  . Elevated ALT measurement 10/18/2017  . Morbid obesity ([place]) 07/11/2015  . Depression 07/11/2015  . Hyperlipidemia associated with type 2 diabetes mellitus ([place]) 07/11/2015  . IBS (irritable bowel syndrome) 07/11/2015  . Hypertension associated with diabetes ([place]) 07/11/2015  . Nicotine dependence, cigarettes, uncomplicated 19/14/7829  . Myalgia due to statin 07/11/2015  . Endometriosis  05/14/2014   Past Medical History:  Diagnosis Date  . Anxiety   . Cervical syndrome   . COPD (chronic obstructive pulmonary disease) ([place])   . Depression   . Edema 2013   legs  . Endometriosis   . Hyperlipidemia   . Hypertension   . IBS (irritable bowel syndrome)   . Lump or mass in breast    left   . Obesity   . Plantar fibromatosis   . Tobacco abuse   . Type 2 diabetes mellitus with morbid obesity ([place]) 03/24/2020    Family History  Adopted: Yes  Problem Relation Age of Onset  . COPD Mother   . Breast cancer Neg Hx     Past Surgical History:  Procedure Laterality  Date  . BREAST BIOPSY Right   . CERVICAL BIOPSY  W/ LOOP ELECTRODE EXCISION    . CESAREAN SECTION  1996  . COLPOSCOPY  2007  . LEEP  2007   Social History   Occupational History  . Not on file  Tobacco Use  . Smoking status: Former Smoker    Packs/day: 0.00    Years: 20.00    Pack years: 0.00  . Smokeless tobacco: Never Used  Vaping Use  . Vaping Use: Every day  . Substances: Nicotine, Flavoring  . Devices: Zero Renvo  Substance and Sexual Activity  . Alcohol use: Never  . Drug use: Yes    Types: Marijuana    Comment: "from time to time"  . Sexual activity: Not Currently    Birth control/protection: None

## 2020-07-15 ENCOUNTER — Other Ambulatory Visit: Payer: Self-pay

## 2020-07-15 ENCOUNTER — Ambulatory Visit (INDEPENDENT_AMBULATORY_CARE_PROVIDER_SITE_OTHER): Payer: 59 | Admitting: Psychiatry

## 2020-07-15 DIAGNOSIS — E119 Type 2 diabetes mellitus without complications: Secondary | ICD-10-CM | POA: Diagnosis not present

## 2020-07-15 DIAGNOSIS — F325 Major depressive disorder, single episode, in full remission: Secondary | ICD-10-CM

## 2020-07-15 DIAGNOSIS — F411 Generalized anxiety disorder: Secondary | ICD-10-CM | POA: Diagnosis not present

## 2020-07-15 DIAGNOSIS — Z634 Disappearance and death of family member: Secondary | ICD-10-CM

## 2020-07-15 NOTE — Progress Notes (Signed)
Psychotherapy Progress Note [facility] [name], PhD LP  Patient ID: Stefanie Stewart     MRN: [id number] Therapy format: Individual psychotherapy Date: 07/15/2020      Start: 2:10p     Stop: 3:00p     Time Spent: 50 min Location: In-person   Session narrative (presenting needs, interim history, self-report of stressors and symptoms, applications of prior therapy, status changes, and interventions made in session) B.s. coming down well enough, walking daily after work.  Frustrated with belly fat.  Discussed insulin resistance as a possibility and outlined nutritional approaches (keto, carb timing), aerobic exercise, and possible medicinal.  Willing to ask her nutritionist next month.  Thoughts of trying aquatic center, [facility], but lots of concerns about appearance in a bathing suit, contamination in the water, things she needs to do at the house, etc.    Stress of a friend's nephew dying.  Stress of son Veronia Beets bringing up [place], and Christmas, very uncomfortable for her, kind of reacted to it.  Worries about [place]'s lack of foresight.  Needs handyman help at the house.  Car may need some extensive work.  Mother's Day coming up, portends to be a lonely time.  Support/empathy provided.  Discussed options for responding to these and affirmed taking a body-first approach to these stresses.  Therapeutic modalities: Cognitive Behavioral Therapy, Solution-Oriented/Positive Psychology, and Ego-Supportive  Mental Status/Observations:  Appearance:   Casual     Behavior:  talkative  Motor:  Normal  Speech/Language:   Clear and Coherent and mild pressure  Affect:  Appropriate  Mood:  anxious  Thought process:  Some flight  Thought content:    WNL and worries  Sensory/Perceptual disturbances:    WNL  Orientation:  Fully oriented  Attention:  Good    Concentration:  Fair  Memory:  WNL  Insight:    Good  Judgment:   Good  Impulse Control:  Fair   Risk Assessment: Danger  to Self: No Self-injurious Behavior: No Danger to Others: No Physical Aggression / Violence: No Duty to Warn: No Access to Firearms a concern: No  Assessment of progress:  progressing  Diagnosis:   ICD-10-CM   1. Generalized anxiety disorder  F41.1     2. Type 2 diabetes mellitus without complication, without long-term current use of insulin (HCC)  E11.9     3. Major depressive disorder, single episode, in remission ([place])  F32.5     4. Uncomplicated bereavement  Z63.4      Plan:  Look into keto approaches for diabetes and information on breaking insulin resistance as most likely nutritional needs Continue walking actively.  Assess possibilities for cardio.   Other recommendations/advice as may be noted above Continue to utilize previously learned skills ad lib Maintain medication as prescribed and work faithfully with relevant prescriber(s) if any changes are desired or seem indicated Call the clinic on-call service, present to ER, or call 911 if any life-threatening psychiatric crisis Return for session(s) already scheduled. Already scheduled visit in this office 07/29/2020.  Blanchie Serve, PhD [name], PhD LP Clinical Psychologist, [facility] [address], [place], [place] [address] [phone]

## 2020-07-29 ENCOUNTER — Ambulatory Visit (INDEPENDENT_AMBULATORY_CARE_PROVIDER_SITE_OTHER): Payer: 59 | Admitting: Psychiatry

## 2020-07-29 ENCOUNTER — Other Ambulatory Visit: Payer: Self-pay

## 2020-07-29 DIAGNOSIS — R69 Illness, unspecified: Secondary | ICD-10-CM | POA: Diagnosis not present

## 2020-07-29 DIAGNOSIS — F411 Generalized anxiety disorder: Secondary | ICD-10-CM | POA: Diagnosis not present

## 2020-07-29 DIAGNOSIS — E119 Type 2 diabetes mellitus without complications: Secondary | ICD-10-CM

## 2020-07-29 DIAGNOSIS — F325 Major depressive disorder, single episode, in full remission: Secondary | ICD-10-CM

## 2020-07-29 NOTE — Progress Notes (Signed)
Psychotherapy Progress Note [facility] 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  Sometimes the atmosphere can be toxic, with office manager (and Dr. Tanna Furry wife) negativity, though she has retired now.  Dr. Tanna Furry "dinosaur" ways can still be drag, but good enough.  Feels dilemma whether to address overscheduling problem personally with the scheduler or don't risk an altercation.  Affirmed choice, ability to talk through if she needs to, just try to slow it down so her audience can feel unpressured, treat it as the reasonable request it is without bracing to defend herself.  First Mother's Day without M, plans to spend some [place] with [place] the zoo Sunday.  Affirmed and encouraged.  Continues walking daily, helping with all things.  B.s. has broken under 100 more reliably.  Looked into getting a Clinical research associate, may join [employer] or [employer] for [employer].  Impatient to get to 200lbs (weighed 240 on 4/4).  Enjoys walking, pretty much daily.  Got mace for security.  Affirmed and encouraged.  Therapeutic modalities: Cognitive Behavioral Therapy, Solution-Oriented/Positive Psychology, and Ego-Supportive  Mental Status/Observations:  Appearance:   Casual     Behavior:  Appropriate  Motor:  Normal  Speech/Language:   Clear and Coherent and mild pressure  Affect:  Appropriate  Mood:  anxious  Thought process:  normal  Thought content:    WNL   Sensory/Perceptual disturbances:    WNL  Orientation:  Fully oriented  Attention:  Good    Concentration:  Fair  Memory:  WNL  Insight:    Good  Judgment:   Good  Impulse Control:  Fair   Risk Assessment: Danger to Self: No Self-injurious Behavior: No Danger to Others: No Physical Aggression / Violence: No Duty to Warn: No Access to Firearms a concern: No  Assessment of progress:  progressing  Diagnosis:   ICD-10-CM   1. Generalized anxiety disorder  F41.1     2. Type 2 diabetes mellitus without complication, without long-term current use of insulin (HCC)  E11.9     3. Major depressive disorder, single episode, in remission ([place])  F32.5     4. r/o SSRI poop-out syndrome  R69      Plan:  Resolved call the [facility], to know better how it would work.  Encourage after that to try a class or two -- water, yoga, or Zumba most likely.  Self-remind permission to modify.  Resolved to go [facility] a water class at [facility] Option to check into rumored [employer] exercise program -- [facility]  Other recommendations/advice as may be noted above Continue to utilize previously learned skills ad lib Maintain medication as prescribed and work faithfully with relevant prescriber(s) if any changes are desired or seem indicated Call the clinic on-call service, present to ER, or call 911 if any life-threatening psychiatric crisis Return for time as available. Already scheduled visit in this office 08/26/2020.  Blanchie Serve, PhD Jonni Sanger  Beila Purdie, PhD LP Clinical Psychologist, [place] [facility] [address] [place], [place] [address] [phone]

## 2020-08-11 ENCOUNTER — Other Ambulatory Visit: Payer: Self-pay

## 2020-08-11 ENCOUNTER — Ambulatory Visit
Admission: RE | Admit: 2020-08-11 | Discharge: 2020-08-11 | Disposition: A | Discharge status: Home / Self Care | Payer: 59 | Source: Ambulatory Visit | Attending: Nurse Practitioner | Admitting: Nurse Practitioner

## 2020-08-11 DIAGNOSIS — Z1231 Encounter for screening mammogram for malignant neoplasm of breast: Secondary | ICD-10-CM

## 2020-08-12 ENCOUNTER — Ambulatory Visit: Payer: 59 | Admitting: Psychiatry

## 2020-08-12 ENCOUNTER — Ambulatory Visit: Payer: 59

## 2020-08-15 ENCOUNTER — Encounter: Payer: 59 | Attending: Nurse Practitioner | Admitting: Dietician

## 2020-08-15 ENCOUNTER — Encounter: Payer: Self-pay | Admitting: Dietician

## 2020-08-15 ENCOUNTER — Other Ambulatory Visit: Payer: Self-pay

## 2020-08-15 DIAGNOSIS — E1169 Type 2 diabetes mellitus with other specified complication: Secondary | ICD-10-CM | POA: Diagnosis not present

## 2020-08-15 NOTE — Patient Instructions (Addendum)
Start to work in some fruit to [employer]. Try fresh strawberries, blueberries, watermelon, or cantaloupe.  Have a 1 cup serving when you have these fruits.  Look into trying out some Hokas walking shoes. Treat yourself to some great walking shoes.  Check out second hand treadmills so that you can keep up the great work walking when the weather is bad.  Check out the "[employer]" for a new way to enjoy your vegetables.  Look into Emerald 100 calorie packs for a snack of nuts and fruit

## 2020-08-15 NOTE — Progress Notes (Signed)
Diabetes Self-Management Education  Visit Type: Follow-up  Appt. Start Time: 1445 Appt. End Time: 1520  08/15/2020  [name], identified by name and date of birth, is a 45 y.o. female with a diagnosis of Diabetes: Type 2.   ASSESSMENT  Height 5\' 8"  (1.727 m), weight 228 lb (103.4 kg). Body mass index is 34.67 kg/m.   Pt has lost 12 pounds since our last visit. Pt reports hitting a standstill on their weight loss and got frustrated. Pt decided to increase the length of their walk to help continue weight loss.  Pt states they have increased their walking to 4-5 miles a day. Pt walks 2 miles a day at lunch. Pt really enjoys going for walks, has tried to jog in spurts a few times. FBGs - 95-125 Pt reports carb cravings are subsiding from the level they were previously. Pt has found that using single serving bags of snacks to control portions has been very effective in weight loss and glycemic control. Pt has also allowed them selves to eat certain foods without guilt, which has helped them with moderation. Pt states their ultimate goal is to get off of their diabetes medication and be under 200 lbs.  Pt is very aware of their role in managing their diabetes and is very determined to make lifelong changes.   Diabetes Self-Management Education - 08/15/20 1500      Visit Information   Visit Type Follow-up      Initial Visit   Diabetes Type Type 2    Are you currently following a meal plan? Yes    Are you taking your medications as prescribed? Yes      Complications   How often do you check your blood sugar? 1-2 times/day    Fasting Blood glucose range (mg/dL) 70-129      Dietary Intake   Breakfast Low sodium V8, low fat cottage cheese, sunflower seeds    Lunch low carb wraps with lettuce, thin sliced swiss cheese, [place], mayonnaise    Dinner Leftover zucchini, squash, peppers, chicken breast      Exercise   Exercise Type Light (walking / raking leaves);ADL's    How many days  per week to you exercise? 5    How many minutes per day do you exercise? 60    Total minutes per week of exercise 300      Patient Self-Evaluation of Goals - Patient rates self as meeting previously set goals (% of time)   Nutrition 25 - 50%    Physical Activity >75%    Medications >75%    Monitoring >75%    Problem Solving 50 - 75 %    Reducing Risk 50 - 75 %    Health Coping 50 - 75 %      Post-Education Assessment   Patient understands the diabetes disease and treatment process. Needs Review    Patient understands incorporating nutritional management into lifestyle. Needs Review    Patient undertands incorporating physical activity into lifestyle. Needs Review    Patient understands using medications safely. Needs Review    Patient understands monitoring blood glucose, interpreting and using results Needs Review    Patient understands prevention, detection, and treatment of acute complications. Needs Review    Patient understands prevention, detection, and treatment of chronic complications. Needs Review    Patient understands how to develop strategies to address psychosocial issues. Needs Review    Patient understands how to develop strategies to promote health/change behavior. Needs Review  Outcomes   Expected Outcomes Demonstrated interest in learning. Expect positive outcomes    Future DMSE 4-6 wks    Program Status Not Completed      Subsequent Visit   Since your last visit have you continued or begun to take your medications as prescribed? Yes    Since your last visit have you had your blood pressure checked? Yes    Is your most recent blood pressure lower, unchanged, or higher since your last visit? Unchanged    Since your last visit have you experienced any weight changes? Loss    Weight Loss (lbs) 12    Since your last visit, are you checking your blood glucose at least once a day? Yes           Individualized Plan for Diabetes Self-Management Training:    Learning Objective:  Patient will have a greater understanding of diabetes self-management. Patient education plan is to attend individual and/or group sessions per assessed needs and concerns.   Plan:   Patient Instructions  Start to work in some fruit to your diet. Try fresh strawberries, blueberries, watermelon, or cantaloupe.  Have a 1 cup serving when you have these fruits.  Look into trying out some Hokas walking shoes. Treat yourself to some great walking shoes.  Check out second hand treadmills so that you can keep up the great work walking when the weather is bad.  Check out the "[employer]" for a new way to enjoy your vegetables.  Look into Emerald 100 calorie packs for a snack of nuts and fruit   Expected Outcomes:  Demonstrated interest in learning. Expect positive outcomes  If problems or questions, patient to contact team via:  Phone and Email  Future DSME appointment: 4-6 wks

## 2020-08-22 ENCOUNTER — Other Ambulatory Visit: Payer: Self-pay | Admitting: Nurse Practitioner

## 2020-08-26 ENCOUNTER — Ambulatory Visit: Payer: 59 | Admitting: Psychiatry

## 2020-09-09 ENCOUNTER — Other Ambulatory Visit: Payer: Self-pay

## 2020-09-09 ENCOUNTER — Ambulatory Visit (INDEPENDENT_AMBULATORY_CARE_PROVIDER_SITE_OTHER): Payer: 59 | Admitting: Psychiatry

## 2020-09-09 DIAGNOSIS — F411 Generalized anxiety disorder: Secondary | ICD-10-CM

## 2020-09-09 DIAGNOSIS — F325 Major depressive disorder, single episode, in full remission: Secondary | ICD-10-CM

## 2020-09-09 DIAGNOSIS — E119 Type 2 diabetes mellitus without complications: Secondary | ICD-10-CM

## 2020-09-09 DIAGNOSIS — Z638 Other specified problems related to primary support group: Secondary | ICD-10-CM | POA: Diagnosis not present

## 2020-09-09 NOTE — Progress Notes (Signed)
Psychotherapy Progress Note [facility] Stefanie Moore, PhD LP  Patient ID: Stefanie Stewart     MRN: [id number] Therapy format: Individual psychotherapy Date: 09/09/2020      Start: 11:15a     Stop: 12:05p     Time Spent: 50 min Location: In-person   Session narrative (presenting needs, interim history, self-report of stressors and symptoms, applications of prior therapy, status changes, and interventions made in session) Stress still from lingering estate business, particularly brother, who blew up on her for asking about him taking care of moving stock into his name that she has been paying tax on for months, called her names, tried to make it about getting her paid off for the expenses she's gone to.  Hung up on him.  Eventual apologies, unilateral decision to just keep paying the taxes.  Has tried to offer to Stefanie Stewart that it's hard but doable to get the stock moved, to no avail.  Back story on Stefanie Stewart that he is in a low-wage job, chronically depressed, on Prozac and Klonopin, and for a long time it's been an ordeal trying to get him to take action.  Stress of home renovations, too, with kitchen floor problems (floating floor buckling) and nonresponse from the reno company, eventually found out the man who was handling her job committed suicide, and his replacement found that they can't take out the flooring without undoing the cabinets.    Found out she offended her best friend in April with a flippant-sounding comment, answering a question about taking an annual girls' trip.  Reviewed for regrets, brainstormed how to make amends.  Friend at work commented that she's "changed", as in she's become brusque.  Acknowledged she may be a lot quicker to act, and react, than she was while depressed and burning out taking care of mother and enduring bad relationship.  Discussed the scope of what she would want to disclose about herself, ways of representing herself as having "hit some  crucial stresses" and "had to get some unhealthy influences" out of her life recently, and be willing to joke that she's still learning how to balance it.    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  Caution about serving multiple roles and working one of them off the books.  Weight loss going well, with walking routine near daily.  Joined a program called [employer] (kick boxing based 30-minute circuit training, like [employer]).  Affirmed it should help with fitness, weight loss, blood sugar control, mood, and resiliency all at the same time.  Therapeutic modalities: Cognitive Behavioral Therapy, Solution-Oriented/Positive Psychology, Ego-Supportive, and Assertiveness/Communication  Mental Status/Observations:  Appearance:   Casual     Behavior:  Appropriate  Motor:  Normal  Speech/Language:   Clear and Coherent and mild pressure  Affect:  Appropriate  Mood:  anxious  Thought process:  normal  Thought content:    WNL, worries  Sensory/Perceptual disturbances:    WNL  Orientation:  Fully oriented  Attention:  Good    Concentration:  Fair  Memory:  WNL  Insight:    Good  Judgment:   Good  Impulse Control:  Good   Risk Assessment: Danger to Self: No Self-injurious Behavior: No Danger to Others: No Physical Aggression / Violence: No Duty to Warn: No Access to Firearms a concern: No  Assessment of progress:  progressing  Diagnosis:   ICD-10-CM   1. Generalized anxiety disorder  F41.1  2. Type 2 diabetes mellitus without complication, without long-term current use of insulin (HCC)  E11.9     3. Major depressive disorder, single episode, in remission ([place])  F32.5     4. Relationship problem with family member  Z63.8      Plan:  Encourage exercise program Caution about managing boundaries with multiple roles and off-books work Work through tasks of estate and home Other recommendations/advice as may be noted  above Continue to utilize previously learned skills ad lib Maintain medication as prescribed and work faithfully with relevant prescriber(s) if any changes are desired or seem indicated Call the clinic on-call service, present to ER, or call 911 if any life-threatening psychiatric crisis Return for time as available. Already scheduled visit in this office 09/30/2020.  Blanchie Serve, PhD Stefanie Moore, PhD LP Clinical Psychologist, [facility] [address], [place], [place] [address] [phone]

## 2020-09-16 ENCOUNTER — Encounter: Payer: Self-pay | Admitting: Internal Medicine

## 2020-09-16 ENCOUNTER — Other Ambulatory Visit (HOSPITAL_COMMUNITY): Payer: Self-pay

## 2020-09-16 ENCOUNTER — Other Ambulatory Visit: Payer: Self-pay

## 2020-09-16 ENCOUNTER — Telehealth (INDEPENDENT_AMBULATORY_CARE_PROVIDER_SITE_OTHER): Payer: 59 | Admitting: Internal Medicine

## 2020-09-16 ENCOUNTER — Telehealth: Payer: Self-pay

## 2020-09-16 DIAGNOSIS — U071 COVID-19: Secondary | ICD-10-CM | POA: Diagnosis not present

## 2020-09-16 MED ORDER — BENZONATATE 100 MG PO CAPS: 100.0000 mg | ORAL_CAPSULE | Freq: Two times a day (BID) | ORAL | 0 refills | Status: DC | PRN

## 2020-09-16 MED ORDER — FEXOFENADINE HCL 180 MG PO TABS: 180.0000 mg | ORAL_TABLET | Freq: Every day | ORAL | 1 refills | Status: DC

## 2020-09-16 MED ORDER — MOLNUPIRAVIR EUA 200MG CAPSULE
4.0000 | ORAL_CAPSULE | Freq: Two times a day (BID) | ORAL | 0 refills | Status: AC
  Filled 2020-09-16: qty 40, 5d supply, fill #0

## 2020-09-16 NOTE — Progress Notes (Signed)
There were no vitals taken for this visit.   Subjective:    Patient ID: Stefanie Stewart, female    DOB: 02-03-1976, 45 y.o.   MRN: [id number]  Chief Complaint  Patient presents with   Covid Positive    Pt states she tested positive for covid this morning. States she started having symptoms on Wednesday. States she has body aches, headache, cough, and a little bit of a sore throat    HPI: Stefanie Stewart is a 45 y.o. female  Myalgias   Headache  This is a new (excedrin helps with headache , no fevers 96 or 97 feels hot and sweaty) problem. The current episode started in the past 7 days. The problem occurs intermittently. The problem has been unchanged. The pain is moderate. Associated symptoms include coughing, rhinorrhea and a sore throat. Pertinent negatives include no abdominal pain, abnormal behavior, anorexia, drainage, ear pain, facial sweating, hearing loss, insomnia, muscle aches, phonophobia, scalp tenderness, sinus pressure, swollen glands or tinnitus.  URI  This is a new (symptoms started wednesday) problem. Associated symptoms include coughing, rhinorrhea and a sore throat. Pertinent negatives include no abdominal pain, ear pain, sinus pain, sneezing or swollen glands.   Chief Complaint  Patient presents with   Covid Positive    Pt states she tested positive for covid this morning. States she started having symptoms on Wednesday. States she has body aches, headache, cough, and a little bit of a sore throat    Relevant past medical, surgical, family and social history reviewed and updated as indicated. Interim medical history since our last visit reviewed. Allergies and medications reviewed and updated.  Review of Systems  HENT:  Positive for rhinorrhea and sore throat. Negative for ear pain, hearing loss, sinus pressure, sinus pain, sneezing and tinnitus.   Respiratory:  Positive for cough.   Gastrointestinal:  Negative for abdominal pain and anorexia.   Psychiatric/Behavioral:  The patient does not have insomnia.    Per HPI unless specifically indicated above     Objective:    There were no vitals taken for this visit.  Wt Readings from Last 3 Encounters:  08/15/20 228 lb (103.4 kg)  06/27/20 240 lb 9.6 oz (109.1 kg)  06/20/20 240 lb (108.9 kg)    Physical Exam  Unable to peform sec to virtual visit.   Results for orders placed or performed in visit on 06/20/20  Bayer DCA Hb A1c Waived  Result Value Ref Range   HB A1C (BAYER DCA - WAIVED) 7.3 (H) <7.0 %  Microalbumin, Urine Waived  Result Value Ref Range   Microalb, Ur Waived 80 (H) 0 - 19 mg/L   Creatinine, Urine Waived 300 10 - 300 mg/dL   Microalb/Creat Ratio 30-300 (H) <30 mg/g  Comprehensive metabolic panel  Result Value Ref Range   Glucose 175 (H) 65 - 99 mg/dL   BUN 12 6 - 24 mg/dL   Creatinine, Ser 0.72 0.57 - 1.00 mg/dL   eGFR 104 >59 mL/min/1.73   BUN/Creatinine Ratio 17 9 - 23   Sodium 142 134 - 144 mmol/L   Potassium 4.5 3.5 - 5.2 mmol/L   Chloride 104 96 - 106 mmol/L   CO2 21 20 - 29 mmol/L   Calcium 9.6 8.7 - 10.2 mg/dL   Total Protein 7.2 6.0 - 8.5 g/dL   Albumin 4.5 3.8 - 4.8 g/dL   Globulin, Total 2.7 1.5 - 4.5 g/dL   Albumin/Globulin Ratio 1.7 1.2 - 2.2   Bilirubin  Total 0.4 0.0 - 1.2 mg/dL   Alkaline Phosphatase 49 44 - 121 IU/L   AST 9 0 - 40 IU/L   ALT 25 0 - 32 IU/L  Lipid Panel w/o Chol/HDL Ratio  Result Value Ref Range   Cholesterol, Total 114 100 - 199 mg/dL   Triglycerides 210 (H) 0 - 149 mg/dL   HDL 25 (L) >39 mg/dL   VLDL Cholesterol Cal 34 5 - 40 mg/dL   LDL Chol Calc (NIH) 55 0 - 99 mg/dL        Current Outpatient Medications:    aspirin-acetaminophen-caffeine (EXCEDRIN MIGRAINE) 250-250-65 MG tablet, Take by mouth every 6 (six) hours as needed for headache., Disp: , Rfl:    blood glucose meter kit and supplies KIT, Dispense based on patient and insurance preference. Use up to four times daily as directed. (FOR ICD-9 250.00,  250.01)., Disp: 1 each, Rfl: 0   buPROPion (WELLBUTRIN XL) 150 MG 24 hr tablet, Take 1 tablet (150 mg total) by mouth daily., Disp: 90 tablet, Rfl: 4   cholecalciferol (VITAMIN D3) 25 MCG (1000 UNIT) tablet, Take 1,000 Units by mouth daily., Disp: , Rfl:    Cinnamon 500 MG capsule, Take 500 mg by mouth 2 (two) times daily., Disp: , Rfl:    Evolocumab (REPATHA SURECLICK) 789 MG/ML SOAJ, Inject 140 mg into the skin every 14 (fourteen) days., Disp: 1 mL, Rfl: 12   losartan (COZAAR) 25 MG tablet, Take 1 tablet (25 mg total) by mouth daily., Disp: 90 tablet, Rfl: 4   metFORMIN (GLUCOPHAGE) 500 MG tablet, Take 2 tablets (1,000 mg total) by mouth 2 (two) times daily with a meal., Disp: 360 tablet, Rfl: 4   norethindrone (AYGESTIN) 5 MG tablet, Take 1 tablet by mouth once daily, Disp: 90 tablet, Rfl: 0   sertraline (ZOLOFT) 50 MG tablet, Take 1.5 tablets (75 mg total) by mouth daily., Disp: 90 tablet, Rfl: 4   methocarbamol (ROBAXIN) 500 MG tablet, Take 1 tablet (500 mg total) by mouth every 8 (eight) hours as needed for muscle spasms. (Patient not taking: No sig reported), Disp: 30 tablet, Rfl: 1   peg 3350 powder (MOVIPREP) 100 g SOLR, Take 1 kit (200 g total) by mouth once for 1 dose. moviprep as directed. No substitutions, Disp: 1 kit, Rfl: 0  Current Facility-Administered Medications:    0.9 %  sodium chloride infusion, 500 mL, Intravenous, Continuous, Ladene Artist, MD    Assessment & Plan:  COVID : positive : Increase fluid intake. Headahce - tyelnol every 4-6 hrs prn and alternate this with ibubrufen 800 mg q 8 hrly.Sinus pressure: use steam inhalation. OTC -  Allegra / claritin. 5 days quarantine.  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    Meds  ordered this encounter  Medications   benzonatate (TESSALON) 100 MG capsule    Sig: Take 1 capsule (100 mg total) by mouth 2 (two) times daily as needed for cough.    Dispense:  20 capsule    Refill:  0   fexofenadine (ALLEGRA ALLERGY) 180 MG tablet    Sig: Take 1 tablet (180 mg total) by mouth daily.    Dispense:  10  tablet    Refill:  1   molnupiravir EUA 200 mg CAPS    Sig: Take 4 capsules (800 mg total) by mouth 2 (two) times daily for 5 days.    Dispense:  40 capsule    Refill:  0     Follow up plan: No follow-ups on file.

## 2020-09-16 NOTE — Telephone Encounter (Signed)
Called pt scheduled virtual today with Dr Neomia Dear  Copied from [place] [phone]. Topic: General - Other >> Sep 16, 2020  7:20 AM Leward Quan A wrote: Reason for CRM: Patient called in to inform Jolene that she tested positive for Covid after being sick for a few days. Say that it is very important to call her this morning. Can be reached at Ph#  [phone]

## 2020-09-19 ENCOUNTER — Ambulatory Visit: Payer: 59 | Admitting: Nurse Practitioner

## 2020-09-22 ENCOUNTER — Ambulatory Visit: Payer: 59 | Admitting: Dietician

## 2020-09-30 ENCOUNTER — Ambulatory Visit (INDEPENDENT_AMBULATORY_CARE_PROVIDER_SITE_OTHER): Payer: 59 | Admitting: Psychiatry

## 2020-09-30 ENCOUNTER — Other Ambulatory Visit: Payer: Self-pay

## 2020-09-30 DIAGNOSIS — F325 Major depressive disorder, single episode, in full remission: Secondary | ICD-10-CM | POA: Diagnosis not present

## 2020-09-30 DIAGNOSIS — F411 Generalized anxiety disorder: Secondary | ICD-10-CM | POA: Diagnosis not present

## 2020-09-30 DIAGNOSIS — R69 Illness, unspecified: Secondary | ICD-10-CM | POA: Diagnosis not present

## 2020-09-30 DIAGNOSIS — E119 Type 2 diabetes mellitus without complications: Secondary | ICD-10-CM | POA: Diagnosis not present

## 2020-09-30 DIAGNOSIS — Z8616 Personal history of COVID-19: Secondary | ICD-10-CM

## 2020-09-30 NOTE — Progress Notes (Signed)
Psychotherapy Progress Note [facility] [name], PhD LP  Patient ID: Stefanie Stewart     MRN: [id number] Therapy format: Individual psychotherapy Date: 09/30/2020      Start: 10:14a     Stop: 11:00a     Time Spent: 46 min Location: In-person   Session narrative (presenting needs, interim history, self-report of stressors and symptoms, applications of prior therapy, status changes, and interventions made in session) COVID 2 wks ago, had cough and felt punk, tested positive.  No loss of taste/smell but did have body aches, chills, sore throat, loss of appetite.  Antiviral treatment (molnupavir) helped, even though she initially mistrusted government issue medication.  Blessed to have a friend bring her food.  Affirmed and encouraged.  Back to gym ([employer]) and walking now.  Affirmed and encouraged.  Priority on making sure she doesn't have to be home alone for Xmas this year -- was really hard last Xmas.    Feels she needs to find and make a friend, or date, or something.  Discussed options for locating new people.  Therapeutic modalities: Cognitive Behavioral Therapy and Solution-Oriented/Positive Psychology  Mental Status/Observations:  Appearance:   Casual     Behavior:  Appropriate  Motor:  Normal  Speech/Language:   Clear and Coherent  Affect:  Appropriate  Mood:  anxious  Thought process:  normal and mild pressure  Thought content:    WNL and worries  Sensory/Perceptual disturbances:    WNL  Orientation:  Fully oriented  Attention:  Good    Concentration:  Fair  Memory:  WNL  Insight:    Good  Judgment:   Good  Impulse Control:  Good   Risk Assessment: Danger to Self: No Self-injurious Behavior: No Danger to Others: No Physical Aggression / Violence: No Duty to Warn: No Access to Firearms a concern: No  Assessment of progress:  progressing  Diagnosis:   ICD-10-CM   1. Generalized anxiety disorder  F41.1     2. Type 2 diabetes mellitus  without complication, without long-term current use of insulin (HCC)  E11.9     3. Major depressive disorder, single episode, in remission ([place])  F32.5     4. r/o SSRI poop-out syndrome  R69     5. History of COVID-19  Z86.16      Plan:  Check with [facility] for caregiver and former caregiver support groups Check with Meetup for activities that may be of interest and people who may share her interests and also want to find others Option to try [email or web address] Continue physical fitness and diabetes care measures Other recommendations/advice as may be noted above Continue to utilize previously learned skills ad lib Maintain medication as prescribed and work faithfully with relevant prescriber(s) if any changes are desired or seem indicated Call the clinic on-call service, present to ER, or call 911 if any life-threatening psychiatric crisis No follow-ups on file. Already scheduled visit in this office 10/14/2020.  Blanchie Serve, PhD [name], PhD LP Clinical Psychologist, [facility] [address], [place], [place] [address] [phone]

## 2020-10-07 ENCOUNTER — Encounter: Payer: Self-pay | Admitting: Nurse Practitioner

## 2020-10-07 ENCOUNTER — Other Ambulatory Visit: Payer: Self-pay

## 2020-10-07 ENCOUNTER — Ambulatory Visit (INDEPENDENT_AMBULATORY_CARE_PROVIDER_SITE_OTHER): Payer: 59 | Admitting: Nurse Practitioner

## 2020-10-07 DIAGNOSIS — Z23 Encounter for immunization: Secondary | ICD-10-CM

## 2020-10-07 DIAGNOSIS — E785 Hyperlipidemia, unspecified: Secondary | ICD-10-CM

## 2020-10-07 DIAGNOSIS — E1159 Type 2 diabetes mellitus with other circulatory complications: Secondary | ICD-10-CM | POA: Diagnosis not present

## 2020-10-07 DIAGNOSIS — E1169 Type 2 diabetes mellitus with other specified complication: Secondary | ICD-10-CM

## 2020-10-07 DIAGNOSIS — I152 Hypertension secondary to endocrine disorders: Secondary | ICD-10-CM

## 2020-10-07 DIAGNOSIS — E1129 Type 2 diabetes mellitus with other diabetic kidney complication: Secondary | ICD-10-CM | POA: Diagnosis not present

## 2020-10-07 DIAGNOSIS — T466X5A Adverse effect of antihyperlipidemic and antiarteriosclerotic drugs, initial encounter: Secondary | ICD-10-CM

## 2020-10-07 DIAGNOSIS — R809 Proteinuria, unspecified: Secondary | ICD-10-CM

## 2020-10-07 DIAGNOSIS — M791 Myalgia, unspecified site: Secondary | ICD-10-CM

## 2020-10-07 DIAGNOSIS — F17210 Nicotine dependence, cigarettes, uncomplicated: Secondary | ICD-10-CM

## 2020-10-07 DIAGNOSIS — Z6833 Body mass index (BMI) 33.0-33.9, adult: Secondary | ICD-10-CM

## 2020-10-07 DIAGNOSIS — E6609 Other obesity due to excess calories: Secondary | ICD-10-CM

## 2020-10-07 LAB — BAYER DCA HB A1C WAIVED: HB A1C (BAYER DCA - WAIVED): 5.8 % (ref <7.0)

## 2020-10-07 NOTE — Assessment & Plan Note (Signed)
Chronic, stable with BP improving with Losartan addition -- urine ALB 80 last visit, downward from previous 150.  At this continue Losartan 25 MG daily and then adjust in future as needed -- may consider discontinuation in future if levels improve further.  Discussed benefit of ARB for kidney protection with proteinuria.  Recommend she monitor BP at least a few mornings a week at home and document.  DASH diet at home.  Labs today CMP.  Return in 3 months.

## 2020-10-07 NOTE — Assessment & Plan Note (Signed)
BMI 33.60, praised for ongoing weight loss.  Recommended eating smaller high protein, low fat meals more frequently and exercising 30 mins a day 5 times a week with a goal of 10-15lb weight loss in the next 3 months. Patient voiced their understanding and motivation to adhere to these recommendations.

## 2020-10-07 NOTE — Assessment & Plan Note (Signed)
New diagnosis A1c 11.3 end of December 2021, today A1c much improved at 5.8%, praised for continued success.  Urine ALB 150 last visit and recent improved at 80.  Continue Metformin, but reduce to 500 MG BID -- will further reduce to discontinue if ongoing improvement next visit.  Continue Losartan 25 MG daily for kidney protection and continue Repatha for cholesterol reduction. Educated on blood sugar goals, less then 130 fasting in morning and <180 two hours after a meal.  Return in 3 months.

## 2020-10-07 NOTE — Patient Instructions (Signed)
Diabetes Mellitus and Nutrition, Adult When you have diabetes, or diabetes mellitus, it is very important to have healthy eating habits because your blood sugar (glucose) levels are greatly affected by what you eat and drink. Eating healthy foods in the right amounts, at about the same times every day, can help you:  Control your blood glucose.  Lower your risk of heart disease.  Improve your blood pressure.  Reach or maintain a healthy weight. What can affect my meal plan? Every person with diabetes is different, and each person has different needs for a meal plan. Your health care provider may recommend that you work with a dietitian to make a meal plan that is best for you. Your meal plan may vary depending on factors such as:  The calories you need.  The medicines you take.  Your weight.  Your blood glucose, blood pressure, and cholesterol levels.  Your activity level.  Other health conditions you have, such as heart or kidney disease. How do carbohydrates affect me? Carbohydrates, also called carbs, affect your blood glucose level more than any other type of food. Eating carbs naturally raises the amount of glucose in your blood. Carb counting is a method for keeping track of how many carbs you eat. Counting carbs is important to keep your blood glucose at a healthy level, especially if you use insulin or take certain oral diabetes medicines. It is important to know how many carbs you can safely have in each meal. This is different for every person. Your dietitian can help you calculate how many carbs you should have at each meal and for each snack. How does alcohol affect me? Alcohol can cause a sudden decrease in blood glucose (hypoglycemia), especially if you use insulin or take certain oral diabetes medicines. Hypoglycemia can be a life-threatening condition. Symptoms of hypoglycemia, such as sleepiness, dizziness, and confusion, are similar to symptoms of having too much  alcohol.  Do not drink alcohol if: ? Your health care provider tells you not to drink. ? You are pregnant, may be pregnant, or are planning to become pregnant.  If you drink alcohol: ? Do not drink on an empty stomach. ? Limit how much you use to:  0-1 drink a day for women.  0-2 drinks a day for men. ? Be aware of how much alcohol is in your drink. In the [place] one drink equals one 12 oz bottle of beer (355 mL), one 5 oz glass of wine (148 mL), or one 1 oz glass of hard liquor (44 mL). ? Keep yourself hydrated with water, diet soda, or unsweetened iced tea.  Keep in mind that regular soda, juice, and other mixers may contain a lot of sugar and must be counted as carbs. What are tips for following this plan? Reading food labels  Start by checking the serving size on the "Nutrition Facts" label of packaged foods and drinks. The amount of calories, carbs, fats, and other nutrients listed on the label is based on one serving of the item. Many items contain more than one serving per package.  Check the total grams (g) of carbs in one serving. You can calculate the number of servings of carbs in one serving by dividing the total carbs by 15. For example, if a food has 30 g of total carbs per serving, it would be equal to 2 servings of carbs.  Check the number of grams (g) of saturated fats and trans fats in one serving. Choose foods that have   a low amount or none of these fats.  Check the number of milligrams (mg) of salt (sodium) in one serving. Most people should limit total sodium intake to less than 2,300 mg per day.  Always check the nutrition information of foods labeled as "low-fat" or "nonfat." These foods may be higher in added sugar or refined carbs and should be avoided.  Talk to your dietitian to identify your daily goals for nutrients listed on the label. Shopping  Avoid buying canned, pre-made, or processed foods. These foods tend to be high in fat, sodium, and added  sugar.  Shop around the outside edge of the grocery store. This is where you will most often find fresh fruits and vegetables, bulk grains, fresh meats, and fresh dairy. Cooking  Use low-heat cooking methods, such as baking, instead of high-heat cooking methods like deep frying.  Cook using healthy oils, such as olive, canola, or sunflower oil.  Avoid cooking with butter, cream, or high-fat meats. Meal planning  Eat meals and snacks regularly, preferably at the same times every day. Avoid going long periods of time without eating.  Eat foods that are high in fiber, such as fresh fruits, vegetables, beans, and whole grains. Talk with your dietitian about how many servings of carbs you can eat at each meal.  Eat 4-6 oz (112-168 g) of lean protein each day, such as lean meat, chicken, fish, eggs, or tofu. One ounce (oz) of lean protein is equal to: ? 1 oz (28 g) of meat, chicken, or fish. ? 1 egg. ?  cup (62 g) of tofu.  Eat some foods each day that contain healthy fats, such as avocado, nuts, seeds, and fish.   What foods should I eat? Fruits Berries. Apples. Oranges. Peaches. Apricots. Plums. Grapes. Mango. Papaya. Pomegranate. Kiwi. Cherries. Vegetables Lettuce. Spinach. Leafy greens, including kale, chard, collard greens, and mustard greens. Beets. Cauliflower. Cabbage. Broccoli. Carrots. Green beans. Tomatoes. Peppers. Onions. Cucumbers. Brussels sprouts. Grains Whole grains, such as whole-wheat or whole-grain bread, crackers, tortillas, cereal, and pasta. Unsweetened oatmeal. Quinoa. Brown or wild rice. Meats and other proteins Seafood. Poultry without skin. Lean cuts of poultry and beef. Tofu. Nuts. Seeds. Dairy Low-fat or fat-free dairy products such as milk, yogurt, and cheese. The items listed above may not be a complete list of foods and beverages you can eat. Contact a dietitian for more information. What foods should I avoid? Fruits Fruits canned with  syrup. Vegetables Canned vegetables. Frozen vegetables with butter or cream sauce. Grains Refined white flour and flour products such as bread, pasta, snack foods, and cereals. Avoid all processed foods. Meats and other proteins Fatty cuts of meat. Poultry with skin. Breaded or fried meats. Processed meat. Avoid saturated fats. Dairy Full-fat yogurt, cheese, or milk. Beverages Sweetened drinks, such as soda or iced tea. The items listed above may not be a complete list of foods and beverages you should avoid. Contact a dietitian for more information. Questions to ask a health care provider  Do I need to meet with a diabetes educator?  Do I need to meet with a dietitian?  What number can I call if I have questions?  When are the best times to check my blood glucose? Where to find more information:  American Diabetes Association: diabetes.org  Academy of Nutrition and Dietetics: www.eatright.org  National Institute of Diabetes and Digestive and Kidney Diseases: www.niddk.nih.gov  Association of Diabetes Care and Education Specialists: www.diabeteseducator.org Summary  It is important to have healthy eating   habits because your blood sugar (glucose) levels are greatly affected by what you eat and drink.  A healthy meal plan will help you control your blood glucose and maintain a healthy lifestyle.  Your health care provider may recommend that you work with a dietitian to make a meal plan that is best for you.  Keep in mind that carbohydrates (carbs) and alcohol have immediate effects on your blood glucose levels. It is important to count carbs and to use alcohol carefully. This information is not intended to replace advice given to you by your health care provider. Make sure you discuss any questions you have with your health care provider. Document Revised: 02/17/2019 Document Reviewed: 02/17/2019 Elsevier Patient Education  2021 Elsevier Inc.  

## 2020-10-07 NOTE — Assessment & Plan Note (Signed)
Chronic, ongoing.  History of poor tolerance statin.  Continue Repatha at this time, which she is tolerating.  Lipid panel today.  Return in 3 months.  Referral to cardiology per insurance recommendations to continue coverage of Repatha, as this is offering her benefit.

## 2020-10-07 NOTE — Assessment & Plan Note (Signed)
Refer to diabetes with proteinuria plan. 

## 2020-10-07 NOTE — Assessment & Plan Note (Signed)
I have recommended complete cessation of tobacco use. I have discussed various options available for assistance with tobacco cessation including over the counter methods (Nicotine gum, patch and lozenges). We also discussed prescription options (Chantix, Nicotine Inhaler / Nasal Spray). The patient is not interested in pursuing any prescription tobacco cessation options at this time.  

## 2020-10-07 NOTE — Assessment & Plan Note (Signed)
History of myalgia, even with 3 day a week Crestor.  At this time continue Repatha, which she is tolerating well, for lipid lowering and stroke prevention.  Have seen great benefit in LDL levels with this.  Will place referral to cardiology per insurance request to continue coverage of this.

## 2020-10-07 NOTE — Progress Notes (Signed)
BP 113/74   Pulse 73   Temp 99.1 F (37.3 C) (Oral)   Wt 221 lb (100.2 kg)   SpO2 97%   BMI 33.60 kg/m    Subjective:    Patient ID: Stefanie Stewart, female    DOB: 01/28/1976, 45 y.o.   MRN: [id number]  HPI: Stefanie Stewart is a 45 y.o. female  Chief Complaint  Patient presents with   Diabetes   Hyperlipidemia   Hypertension   Ear Problem    Patient states she wants her to discuss an issues she is having with her ears. Patient states she is having itching and states it feels as if something is moving around in her R ear.    Medication Problem    Patient states she would like to discuss cutting back or stopping her medications at today's visit. Patient states that is the reason for her wanting to lose weight.    DIABETES A1c in December 2021 was 11.3 and urine ALB 150 -- started on Metformin 1000 MG BID -- currently is taking 1000 MG in morning and 1000 MG at night.  She was also started on Losartan 25 MG for BP and albuminuria.  Has attended diabetic education class x 2.  Last A1c in March was 7.3% and urine ALB 80.  She reports she is walking daily 3.2 to 5 miles + doing kick boxing classes.   Hypoglycemic episodes:no Polydipsia/polyuria: no Visual disturbance: no Chest pain: no Paresthesias: no Glucose Monitoring: yes  Accucheck frequency: Daily  Fasting glucose: Averaging 100 to 125  Post prandial:  Evening:  Before meals: Taking Insulin?: no  Long acting insulin:  Short acting insulin: Blood Pressure Monitoring: not checking Retinal Examination: Up to Date -- in November Foot Exam: Up to Date Diabetic Education: yes has attended 2 Pneumovax:  refused Influenza:  refused Aspirin: no   HYPERTENSION / HYPERLIPIDEMIA Taking Losartan 25 MG for BP and kidney protection.  Taking Repatha for HLD -- as did not tolerate statins, even on 1 or 3 day a week schedule.  Repatha has shown to be very beneficial at lowering levels and she has been tolerating well.  Insurance  is wanting patient to go to cardiology to continue Haakon and cover.  She does continue to vape, but has cut back to every other -- vapes nicotine.   Satisfied with current treatment? yes Duration of hypertension: chronic BP monitoring frequency: not checking BP range:  BP medication side effects: no Duration of hyperlipidemia: chronic Cholesterol medication side effects: no Cholesterol supplements: none Medication compliance: good compliance Aspirin: no Recent stressors: no Recurrent headaches: no Visual changes: no Palpitations: no Dyspnea: no Chest pain: no Lower extremity edema: no Dizzy/lightheaded: no  The ASCVD Risk score Mikey Bussing DC Jr., et al., 2013) failed to calculate for the following reasons:   The valid total cholesterol range is 130 to 320 mg/dL   EAR ITCHING Both eyes are really itchy -- R>L.   Duration: weeks Involved ear(s): bilateral Severity:  mild  Quality:  itchy Fever: no Otorrhea: no Upper respiratory infection symptoms:  recent Covid Pruritus: yes Hearing loss: no Water immersion no Using Q-tips: yes Recurrent otitis media: no Status: stable Treatments attempted: none   Relevant past medical, surgical, family and social history reviewed and updated as indicated. Interim medical history since our last visit reviewed. Allergies and medications reviewed and updated.  Review of Systems  Constitutional:  Negative for activity change, appetite change, diaphoresis, fatigue and fever.  Respiratory:  Negative for cough, chest tightness and shortness of breath.   Cardiovascular:  Negative for chest pain, palpitations and leg swelling.  Gastrointestinal: Negative.   Endocrine: Negative for polydipsia, polyphagia and polyuria.  Neurological: Negative.   Psychiatric/Behavioral: Negative.     Per HPI unless specifically indicated above     Objective:    BP 113/74   Pulse 73   Temp 99.1 F (37.3 C) (Oral)   Wt 221 lb (100.2 kg)   SpO2 97%   BMI  33.60 kg/m   Wt Readings from Last 3 Encounters:  10/07/20 221 lb (100.2 kg)  08/15/20 228 lb (103.4 kg)  06/27/20 240 lb 9.6 oz (109.1 kg)    Physical Exam Vitals and nursing note reviewed.  Constitutional:      General: She is awake. She is not in acute distress.    Appearance: She is well-developed and well-groomed. She is obese. She is not ill-appearing.  HENT:     Head: Normocephalic.     Right Ear: Hearing normal.     Left Ear: Hearing normal.  Eyes:     General: Lids are normal.        Right eye: No discharge.        Left eye: No discharge.     Conjunctiva/sclera: Conjunctivae normal.     Pupils: Pupils are equal, round, and reactive to light.  Neck:     Thyroid: No thyromegaly.     Vascular: No carotid bruit.  Cardiovascular:     Rate and Rhythm: Normal rate and regular rhythm.     Heart sounds: Normal heart sounds. No murmur heard.   No gallop.  Pulmonary:     Effort: Pulmonary effort is normal. No accessory muscle usage or respiratory distress.     Breath sounds: Normal breath sounds.  Abdominal:     General: Bowel sounds are normal.     Palpations: Abdomen is soft. There is no hepatomegaly or splenomegaly.  Musculoskeletal:     Right shoulder: Normal.     Left shoulder: No swelling, effusion, laceration, tenderness, bony tenderness or crepitus. Normal range of motion. Normal strength.     Cervical back: Normal range of motion and neck supple.     Right lower leg: No edema.     Left lower leg: No edema.  Lymphadenopathy:     Cervical: No cervical adenopathy.  Skin:    General: Skin is warm and dry.  Neurological:     Mental Status: She is alert and oriented to person, place, and time.  Psychiatric:        Attention and Perception: Attention normal.        Mood and Affect: Mood normal.        Speech: Speech normal.        Behavior: Behavior normal. Behavior is cooperative.        Thought Content: Thought content normal.   Results for orders placed or  performed in visit on 06/20/20  Bayer DCA Hb A1c Waived  Result Value Ref Range   HB A1C (BAYER DCA - WAIVED) 7.3 (H) <7.0 %  Microalbumin, Urine Waived  Result Value Ref Range   Microalb, Ur Waived 80 (H) 0 - 19 mg/L   Creatinine, Urine Waived 300 10 - 300 mg/dL   Microalb/Creat Ratio 30-300 (H) <30 mg/g  Comprehensive metabolic panel  Result Value Ref Range   Glucose 175 (H) 65 - 99 mg/dL   BUN 12 6 - 24 mg/dL   Creatinine, Ser  0.72 0.57 - 1.00 mg/dL   eGFR 104 >59 mL/min/1.73   BUN/Creatinine Ratio 17 9 - 23   Sodium 142 134 - 144 mmol/L   Potassium 4.5 3.5 - 5.2 mmol/L   Chloride 104 96 - 106 mmol/L   CO2 21 20 - 29 mmol/L   Calcium 9.6 8.7 - 10.2 mg/dL   Total Protein 7.2 6.0 - 8.5 g/dL   Albumin 4.5 3.8 - 4.8 g/dL   Globulin, Total 2.7 1.5 - 4.5 g/dL   Albumin/Globulin Ratio 1.7 1.2 - 2.2   Bilirubin Total 0.4 0.0 - 1.2 mg/dL   Alkaline Phosphatase 49 44 - 121 IU/L   AST 9 0 - 40 IU/L   ALT 25 0 - 32 IU/L  Lipid Panel w/o Chol/HDL Ratio  Result Value Ref Range   Cholesterol, Total 114 100 - 199 mg/dL   Triglycerides 210 (H) 0 - 149 mg/dL   HDL 25 (L) >39 mg/dL   VLDL Cholesterol Cal 34 5 - 40 mg/dL   LDL Chol Calc (NIH) 55 0 - 99 mg/dL      Assessment & Plan:   Problem List Items Addressed This Visit       Cardiovascular and Mediastinum   Hypertension associated with diabetes (HCC) (Chronic)    Chronic, stable with BP improving with Losartan addition -- urine ALB 80 last visit, downward from previous 150.  At this continue Losartan 25 MG daily and then adjust in future as needed -- may consider discontinuation in future if levels improve further.  Discussed benefit of ARB for kidney protection with proteinuria.  Recommend she monitor BP at least a few mornings a week at home and document.  DASH diet at home.  Labs today CMP.  Return in 3 months.        Relevant Orders   Bayer DCA Hb A1c Waived   Comprehensive metabolic panel   Ambulatory referral to  Cardiology     Endocrine   Hyperlipidemia associated with type 2 diabetes mellitus (HCC) (Chronic)    Chronic, ongoing.  History of poor tolerance statin.  Continue Repatha at this time, which she is tolerating.  Lipid panel today.  Return in 3 months.  Referral to cardiology per insurance recommendations to continue coverage of Repatha, as this is offering her benefit.       Relevant Orders   Bayer DCA Hb A1c Waived   Lipid Panel w/o Chol/HDL Ratio   Ambulatory referral to Cardiology   Type 2 diabetes mellitus with morbid obesity ([place]) - Primary (Chronic)    Refer to diabetes with proteinuria plan.       Relevant Orders   Bayer [place] Hb A1c Waived   Ambulatory referral to Cardiology   Type 2 diabetes mellitus with proteinuria ([place]) (Chronic)    New diagnosis A1c 11.3 end of December 2021, today A1c much improved at 5.8%, praised for continued success.  Urine ALB 150 last visit and recent improved at 80.  Continue Metformin, but reduce to 500 MG BID -- will further reduce to discontinue if ongoing improvement next visit.  Continue Losartan 25 MG daily for kidney protection and continue Repatha for cholesterol reduction. Educated on blood sugar goals, less then 130 fasting in morning and <180 two hours after a meal.  Return in 3 months.         Other   Obesity    BMI 33.60, praised for ongoing weight loss.  Recommended eating smaller high protein, low fat meals more frequently and  exercising 30 mins a day 5 times a week with a goal of 10-15lb weight loss in the next 3 months. Patient voiced their understanding and motivation to adhere to these recommendations.        Nicotine dependence, cigarettes, uncomplicated    I have recommended complete cessation of tobacco use. I have discussed various options available for assistance with tobacco cessation including over the counter methods (Nicotine gum, patch and lozenges). We also discussed prescription options (Chantix, Nicotine Inhaler /  Nasal Spray). The patient is not interested in pursuing any prescription tobacco cessation options at this time.        Myalgia due to statin    History of myalgia, even with 3 day a week Crestor.  At this time continue Repatha, which she is tolerating well, for lipid lowering and stroke prevention.  Have seen great benefit in LDL levels with this.  Will place referral to cardiology per insurance request to continue coverage of this.       Other Visit Diagnoses     Pneumococcal vaccination given       PPSV23 provided today due to diabetes.   Relevant Orders   Pneumococcal polysaccharide vaccine 23-valent greater than or equal to 2yo subcutaneous/IM (Completed)        Follow up plan: Return in about 3 months (around 01/07/2021) for T2DM, HTN/HLD, MOOD.

## 2020-10-08 LAB — COMPREHENSIVE METABOLIC PANEL WITH GFR
ALT: 17 IU/L (ref 0–32)
AST: 10 IU/L (ref 0–40)
Albumin/Globulin Ratio: 2.3 — ABNORMAL HIGH (ref 1.2–2.2)
Albumin: 4.6 g/dL (ref 3.8–4.8)
Alkaline Phosphatase: 42 IU/L — ABNORMAL LOW (ref 44–121)
BUN/Creatinine Ratio: 20 (ref 9–23)
BUN: 14 mg/dL (ref 6–24)
Bilirubin Total: 0.6 mg/dL (ref 0.0–1.2)
CO2: 23 mmol/L (ref 20–29)
Calcium: 9.6 mg/dL (ref 8.7–10.2)
Chloride: 105 mmol/L (ref 96–106)
Creatinine, Ser: 0.69 mg/dL (ref 0.57–1.00)
Globulin, Total: 2 g/dL (ref 1.5–4.5)
Glucose: 119 mg/dL — ABNORMAL HIGH (ref 65–99)
Potassium: 4.9 mmol/L (ref 3.5–5.2)
Sodium: 140 mmol/L (ref 134–144)
Total Protein: 6.6 g/dL (ref 6.0–8.5)
eGFR: 108 mL/min/1.73 (ref >59)

## 2020-10-08 LAB — LIPID PANEL W/O CHOL/HDL RATIO
Cholesterol, Total: 81 mg/dL — ABNORMAL LOW (ref 100–199)
HDL: 26 mg/dL — ABNORMAL LOW (ref >39)
LDL Chol Calc (NIH): 31 mg/dL (ref 0–99)
Triglycerides: 134 mg/dL (ref 0–149)
VLDL Cholesterol Cal: 24 mg/dL (ref 5–40)

## 2020-10-08 NOTE — Progress Notes (Signed)
Contacted via [place] morning Stefanie Stewart, your labs have returned and looked at that LDL -- it continues to be very well controlled with your Repatha.  LDL is 31 and total cholesterol 81 -- tightly controlled and protected against stroke or heart attack.  Kidney function, creatinine and eGFR, and liver function, AST and ALT, are all normal.  Great job!!  Any questions? Keep being awesome!!  Thank you for allowing me to participate in your care.  I appreciate you. Kindest regards, Jalil Lorusso

## 2020-10-13 ENCOUNTER — Ambulatory Visit: Payer: 59 | Admitting: Dietician

## 2020-10-14 ENCOUNTER — Ambulatory Visit: Payer: 59 | Admitting: Psychiatry

## 2020-10-28 ENCOUNTER — Ambulatory Visit (INDEPENDENT_AMBULATORY_CARE_PROVIDER_SITE_OTHER): Payer: 59 | Admitting: Psychiatry

## 2020-10-28 ENCOUNTER — Other Ambulatory Visit: Payer: Self-pay

## 2020-10-28 DIAGNOSIS — E119 Type 2 diabetes mellitus without complications: Secondary | ICD-10-CM | POA: Diagnosis not present

## 2020-10-28 DIAGNOSIS — F411 Generalized anxiety disorder: Secondary | ICD-10-CM

## 2020-10-28 DIAGNOSIS — F325 Major depressive disorder, single episode, in full remission: Secondary | ICD-10-CM | POA: Diagnosis not present

## 2020-10-28 NOTE — Progress Notes (Signed)
Psychotherapy Progress Note [facility] [name], PhD LP  Patient ID: Rhianon Sliger     MRN: [id number] Therapy format: Individual psychotherapy Date: 10/28/2020      Start: 10:15a     Stop: 11:00a     Time Spent: 45 min Location: In-person   Session narrative (presenting needs, interim history, self-report of stressors and symptoms, applications of prior therapy, status changes, and interventions made in session) Doing very well with diabetes care.  Meds will be decreasing, including Metformin, but stunningly low cholesterol.  Exercise working, weight loss working.  Son coming over this weekend.  Getting practice saying no to some things, like not staying for a carb-loaded meal with friend Earnest Bailey, for whom she is making a lasagna (in bereavement).  Earnest Bailey also has a cardiac history but remains in carb addiction herself.  Denies she is any temptation or difficulty adhering to her own food plan.  Tried [employer] but balked at the $300 cost.  Discussed pros/cons.  Interested in travel.  Possible beach trip soon, would like to go to [place] in the coming years.    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  Still much relieved by Dr. Tanna Furry wife not being in the office.    Therapeutic modalities: Cognitive Behavioral Therapy, Solution-Oriented/Positive Psychology, and Ego-Supportive  Mental Status/Observations:  Appearance:   Casual     Behavior:  Appropriate  Motor:  Normal  Speech/Language:   Clear and Coherent  Affect:  Appropriate  Mood:  anxious, less  Thought process:  Mild flight  Thought content:    WNL  Sensory/Perceptual disturbances:    WNL  Orientation:  Fully oriented  Attention:  Good    Concentration:  Fair  Memory:  WNL  Insight:    Good  Judgment:   Good  Impulse Control:  Fair   Risk Assessment: Danger to Self: No Self-injurious Behavior: No Danger to  Others: No Physical Aggression / Violence: No Duty to Warn: No Access to Firearms a concern: No  Assessment of progress:  progressing  Diagnosis:   ICD-10-CM   1. Generalized anxiety disorder  F41.1     2. Type 2 diabetes mellitus without complication, without long-term current use of insulin (HCC)  E11.9     3. 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 Already scheduled visit in this office 11/11/2020.  Blanchie Serve, PhD [name], PhD LP Clinical Psychologist, [facility] [address] [place], [place] [address] [phone]

## 2020-10-31 ENCOUNTER — Telehealth: Payer: Self-pay | Admitting: Nurse Practitioner

## 2020-10-31 NOTE — Telephone Encounter (Signed)
Will complete once receive

## 2020-10-31 NOTE — Telephone Encounter (Signed)
Pt called and stated that [place] will be sending a fax for RX for Evolocumab (REPATHA SURECLICK) [id number] MG/ML SOAJ/ please fill and send back to them / please advise

## 2020-10-31 NOTE — Telephone Encounter (Signed)
FYI

## 2020-11-01 ENCOUNTER — Telehealth: Payer: Self-pay | Admitting: Pharmacist

## 2020-11-01 ENCOUNTER — Other Ambulatory Visit: Payer: Self-pay

## 2020-11-01 MED ORDER — REPATHA SURECLICK 140 MG/ML ~~LOC~~ SOAJ: 140.0000 mg | SUBCUTANEOUS | 12 refills | Status: DC

## 2020-11-01 NOTE — Telephone Encounter (Signed)
Received a fax from [place] for a script for Repatha--I have faxed to [facility], NP--this patient is no longer eligible for [facility] since she has insurance.

## 2020-11-07 NOTE — Telephone Encounter (Signed)
Prescription was faxed to company, never received paperwork from company. Will close this encounter.

## 2020-11-09 ENCOUNTER — Other Ambulatory Visit: Payer: Self-pay | Admitting: Nurse Practitioner

## 2020-11-09 NOTE — Telephone Encounter (Signed)
Requested Prescriptions  Pending Prescriptions Disp Refills  . norethindrone (AYGESTIN) 5 MG tablet [Pharmacy Med Name: Norethindrone Acetate 5 MG Oral Tablet] 90 tablet 0    Sig: Take 1 tablet by mouth once daily     OB/GYN: Contraceptives - Progestins Passed - 11/09/2020  7:40 PM      Passed - Valid encounter within last 12 months    Recent Outpatient Visits          1 month ago Type 2 diabetes mellitus with morbid obesity ([place])   [place], Henrine Screws T, NP   1 month ago COVID-19   [employer], Avanti, MD   4 months ago Type 2 diabetes mellitus with proteinuria ([place])   [place], Jolene T, NP   6 months ago Type 2 diabetes mellitus with proteinuria ([place])   [place], Jolene T, NP   7 months ago Type 2 diabetes mellitus with morbid obesity ([place])   [place], [name], NP      Future Appointments            In 1 month [place], Kathlene November, MD [place]   In 2 months [place], [name], NP [employer], [facility]

## 2020-11-11 ENCOUNTER — Ambulatory Visit: Payer: 59 | Admitting: Psychiatry

## 2020-11-25 ENCOUNTER — Ambulatory Visit: Payer: 59 | Admitting: Cardiovascular Disease

## 2020-11-30 ENCOUNTER — Other Ambulatory Visit: Payer: Self-pay

## 2020-11-30 ENCOUNTER — Encounter (HOSPITAL_COMMUNITY): Payer: Self-pay

## 2020-11-30 ENCOUNTER — Emergency Department (HOSPITAL_COMMUNITY): Payer: 59

## 2020-11-30 ENCOUNTER — Emergency Department (HOSPITAL_COMMUNITY)
Admission: EM | Admit: 2020-11-30 | Discharge: 2020-11-30 | Disposition: A | Discharge status: Home / Self Care | Payer: 59 | Attending: Emergency Medicine | Admitting: Emergency Medicine

## 2020-11-30 DIAGNOSIS — S99922A Unspecified injury of left foot, initial encounter: Secondary | ICD-10-CM | POA: Diagnosis present

## 2020-11-30 DIAGNOSIS — Z79899 Other long term (current) drug therapy: Secondary | ICD-10-CM | POA: Diagnosis not present

## 2020-11-30 DIAGNOSIS — W228XXA Striking against or struck by other objects, initial encounter: Secondary | ICD-10-CM | POA: Insufficient documentation

## 2020-11-30 DIAGNOSIS — J449 Chronic obstructive pulmonary disease, unspecified: Secondary | ICD-10-CM | POA: Insufficient documentation

## 2020-11-30 DIAGNOSIS — Z7984 Long term (current) use of oral hypoglycemic drugs: Secondary | ICD-10-CM | POA: Diagnosis not present

## 2020-11-30 DIAGNOSIS — E119 Type 2 diabetes mellitus without complications: Secondary | ICD-10-CM | POA: Diagnosis not present

## 2020-11-30 DIAGNOSIS — Z87891 Personal history of nicotine dependence: Secondary | ICD-10-CM | POA: Insufficient documentation

## 2020-11-30 DIAGNOSIS — I10 Essential (primary) hypertension: Secondary | ICD-10-CM | POA: Diagnosis not present

## 2020-11-30 DIAGNOSIS — Y9343 Activity, gymnastics: Secondary | ICD-10-CM | POA: Insufficient documentation

## 2020-11-30 DIAGNOSIS — S9032XA Contusion of left foot, initial encounter: Secondary | ICD-10-CM | POA: Diagnosis not present

## 2020-11-30 MED ORDER — IBUPROFEN 600 MG PO TABS: 600.0000 mg | ORAL_TABLET | Freq: Four times a day (QID) | ORAL | 0 refills | Status: AC | PRN

## 2020-11-30 NOTE — ED Triage Notes (Signed)
Pt reports left foot injury today at 1715 at the gym after doing "donkey kicks".

## 2020-11-30 NOTE — ED Provider Notes (Signed)
[facility] DEPT Provider Note   CSN: [id number] Arrival date & time: 11/30/20  2145     History Chief Complaint  Patient presents with   Foot Pain    Stefanie Stewart is a 44 y.o. female.  The history is provided by the patient. No language interpreter was used.  Foot Pain   45 year old female with history of diabetes, obesity, hypertension, plantar fibromatosis who presents for evaluation of foot injury.  Patient reported she injured her left foot today at the gym approximately 2 hours ago when she was doing "donkey kicks" as part of the exercise.  She accidentally kicked the metal bar that was holding up the punching bag.  She reported cute onset of sharp throbbing pain to the dorsum of her left foot but she was able to continue with her exercises for an additional 30 minutes and see if she went home.  After going home she noticed increasing throbbing pain about the foot.  Pain is nonradiating but moderate in severity.  She was able to ambulate afterward.  She denies any numbness no ankle pain or knee pain.  She did tried an Advertising copywriter as well as over-the-counter ibuprofen with some improvement.  Past Medical History:  Diagnosis Date   Anxiety    Cervical syndrome    COPD (chronic obstructive pulmonary disease) ([place])    Depression    Edema 2013   legs   Endometriosis    Hyperlipidemia    Hypertension    IBS (irritable bowel syndrome)    Lump or mass in breast    left    Obesity    Plantar fibromatosis    Tobacco abuse    Type 2 diabetes mellitus with morbid obesity ([place]) 03/24/2020    Patient Active Problem List   Diagnosis Date Noted   Type 2 diabetes mellitus with morbid obesity ([place]) 03/24/2020   Type 2 diabetes mellitus with proteinuria ([place]) 03/24/2020   Acute pain of left shoulder 03/24/2020   Elevated ALT measurement 10/18/2017   Obesity 07/11/2015   Depression 07/11/2015   Hyperlipidemia associated with type 2 diabetes  mellitus ([place]) 07/11/2015   IBS (irritable bowel syndrome) 07/11/2015   Hypertension associated with diabetes ([place]) 07/11/2015   Nicotine dependence, cigarettes, uncomplicated 79/89/2119   Myalgia due to statin 07/11/2015   Endometriosis 05/14/2014    Past Surgical History:  Procedure Laterality Date   BREAST BIOPSY Left 02/03/2016   apocrine metaplasia    CERVICAL BIOPSY  W/ LOOP ELECTRODE EXCISION     CESAREAN SECTION  1996   COLPOSCOPY  2007   LEEP  2007     OB History     Gravida  1   Para  1   Term      Preterm      AB      Living  1      SAB      IAB      Ectopic      Multiple      Live Births           Obstetric Comments  First pregnancy 18 First menstrual 10         Family History  Adopted: Yes  Problem Relation Age of Onset   COPD Mother    Breast cancer Neg Hx     Social History   Tobacco Use   Smoking status: Former    Packs/day: 0.00    Years: 20.00    Pack  years: 0.00    Types: Cigarettes   Smokeless tobacco: Never  Vaping Use   Vaping Use: Every day   Substances: Nicotine, Flavoring   Devices: Zero Renvo  Substance Use Topics   Alcohol use: Never   Drug use: Not Currently    Types: Marijuana    Comment: "from time to time"    Home Medications Prior to Admission medications   Medication Sig Start Date End Date Taking? Authorizing Provider  aspirin-acetaminophen-caffeine (EXCEDRIN MIGRAINE) [phone] MG tablet Take by mouth every 6 (six) hours as needed for headache.    [provider]  benzonatate (TESSALON) 100 MG capsule Take 1 capsule (100 mg total) by mouth 2 (two) times daily as needed for cough. 09/16/20   Charlynne Cousins, MD  blood glucose meter kit and supplies KIT Dispense based on patient and insurance preference. Use up to four times daily as directed. (FOR ICD-9 250.00, 250.01). 03/24/20   [name], Henrine Screws T, NP  buPROPion (WELLBUTRIN XL) 150 MG 24 hr tablet Take 1 tablet (150 mg total) by mouth  daily. 05/09/20   [name], Henrine Screws T, NP  cholecalciferol (VITAMIN D3) 25 MCG (1000 UNIT) tablet Take 1,000 Units by mouth daily.    [provider]  Cinnamon 500 MG capsule Take 500 mg by mouth 2 (two) times daily.    [provider]  Evolocumab (REPATHA SURECLICK) 403 MG/ML SOAJ Inject 140 mg into the skin every 14 (fourteen) days. 11/01/20   [name], Henrine Screws T, NP  fexofenadine (ALLEGRA ALLERGY) 180 MG tablet Take 1 tablet (180 mg total) by mouth daily. 09/16/20   Vigg, Avanti, MD  losartan (COZAAR) 25 MG tablet Take 1 tablet (25 mg total) by mouth daily. 05/09/20   Marnee Guarneri T, NP  metFORMIN (GLUCOPHAGE) 500 MG tablet Take 2 tablets (1,000 mg total) by mouth 2 (two) times daily with a meal. 05/09/20   [name], Henrine Screws T, NP  norethindrone (AYGESTIN) 5 MG tablet Take 1 tablet by mouth once daily 11/09/20   [name], Jolene T, NP  peg 3350 powder (MOVIPREP) 100 g SOLR Take 1 kit (200 g total) by mouth once for 1 dose. moviprep as directed. No substitutions 05/24/20 05/24/20  Ladene Artist, MD  sertraline (ZOLOFT) 50 MG tablet Take 1.5 tablets (75 mg total) by mouth daily. 05/09/20   [name], NP    Allergies    Bactrim [sulfamethoxazole-trimethoprim] and Banana  Review of Systems   Review of Systems  Constitutional:  Negative for fever.  Musculoskeletal:  Positive for arthralgias.  Skin:  Negative for wound.  Neurological:  Negative for numbness.   Physical Exam Updated Vital Signs BP (!) 168/101 (BP Location: Left Arm)   Pulse 90   Temp 98.3 F (36.8 C) (Oral)   Resp 18   Ht $R'5\' 8"'ew$  (1.727 m)   Wt 98.4 kg   SpO2 97%   BMI 32.99 kg/m   Physical Exam Vitals and nursing note reviewed.  Constitutional:      General: She is not in acute distress.    Appearance: She is well-developed.  HENT:     Head: Atraumatic.  Eyes:     Conjunctiva/sclera: Conjunctivae normal.  Pulmonary:     Effort: Pulmonary effort is normal.  Musculoskeletal:        General:  Tenderness (Left foot: Tenderness to dorsum of midfoot on palpation without any crepitus or ecchymosis noted.  No deformity.  Dorsalis pedis pulse palpable brisk cap refill.  No tenderness about left ankle) present.  Cervical back: Neck supple.  Skin:    Findings: No rash.  Neurological:     Mental Status: She is alert.  Psychiatric:        Mood and Affect: Mood normal.    ED Results / Procedures / Treatments   Labs (all labs ordered are listed, but only abnormal results are displayed) Labs Reviewed - No data to display  EKG None  Radiology No results found.  Procedures Procedures   Medications Ordered in ED Medications - No data to display  ED Course  I have reviewed the triage vital signs and the nursing notes.  Pertinent labs & imaging results that were available during my care of the patient were reviewed by me and considered in my medical decision making (see chart for details).    MDM Rules/Calculators/A&P                           BP (!) 168/101 (BP Location: Left Arm)   Pulse 90   Temp 98.3 F (36.8 C) (Oral)   Resp 18   Ht $R'5\' 8"'zU$  (1.727 m)   Wt 98.4 kg   SpO2 97%   BMI 32.99 kg/m   Final Clinical Impression(s) / ED Diagnoses Final diagnoses:  Contusion of left foot, initial encounter    Rx / DC Orders ED Discharge Orders          Ordered    ibuprofen (ADVIL) 600 MG tablet  Every 6 hours PRN        11/30/20 2316           11:14 PM Patient injured her left foot after she accidentally kicked a metal bar while exercising earlier today.  X-ray of the left foot without any acute finding.  This is likely to be a bone contusion.  Ace wrap provided and rice therapy discussed patient otherwise stable for discharge.  Orthopedic referral given as needed   Doy Hutching 11/30/20 2317    Isla Pence, MD 12/03/20 1510

## 2020-12-02 ENCOUNTER — Ambulatory Visit (INDEPENDENT_AMBULATORY_CARE_PROVIDER_SITE_OTHER): Payer: 59 | Admitting: Psychiatry

## 2020-12-02 ENCOUNTER — Other Ambulatory Visit: Payer: Self-pay

## 2020-12-02 DIAGNOSIS — Z566 Other physical and mental strain related to work: Secondary | ICD-10-CM | POA: Diagnosis not present

## 2020-12-02 DIAGNOSIS — F411 Generalized anxiety disorder: Secondary | ICD-10-CM

## 2020-12-02 DIAGNOSIS — E119 Type 2 diabetes mellitus without complications: Secondary | ICD-10-CM

## 2020-12-02 DIAGNOSIS — F325 Major depressive disorder, single episode, in full remission: Secondary | ICD-10-CM
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
## 2020-12-05 ENCOUNTER — Encounter: Payer: Self-pay | Admitting: Dietician

## 2020-12-05 ENCOUNTER — Encounter: Payer: 59 | Attending: Nurse Practitioner | Admitting: Dietician

## 2020-12-05 ENCOUNTER — Other Ambulatory Visit: Payer: Self-pay

## 2020-12-05 VITALS — Ht 68.0 in | Wt 216.2 lb

## 2020-12-05 DIAGNOSIS — E119 Type 2 diabetes mellitus without complications: Secondary | ICD-10-CM

## 2020-12-05 NOTE — Progress Notes (Signed)
Diabetes Self-Management Education  Visit Type: Follow-up  Appt. Start Time: 1450 Appt. End Time: [id number]  12/05/2020  [name], identified by name and date of birth, is a 45 y.o. female with a diagnosis of Diabetes:  .   ASSESSMENT Pt A1c is now at 5.8! Cholesterol is also well controlled.  Pt is down to 1,000 mg of metformin BID from 2,000 mg BID. Pt has lost another 12 pounds since our last visit and still has a weight loss goal of getting to 200 lbs. Pt reports consistently exercising and watching what they eat, less sugars and fats. Pt reports getting up early and walking 2-3 miles, walks at lunch, and goes to the gym in the evenings. Pt reports constantly working on controlling cravings, states it takes daily effort. Pt reports injuring their foot while exercising recently, but has recovered.  Pt is concerned that there may be a stressful period at work in the near future, and they are not sure how they are going to handle it.  Height '5\' 8"'$  (1.727 m), weight 216 lb 3.2 oz (98.1 kg). Body mass index is 32.87 kg/m.   Diabetes Self-Management Education - 12/05/20 1512       Visit Information   Visit Type Follow-up      Complications   Last HgB A1C per patient/outside source 5.8 %   10/07/2020   How often do you check your blood sugar? 1-2 times/day    Fasting Blood glucose range (mg/dL) 70-129      Dietary Intake   Breakfast 2 egg frittata, 1 low sodium V8    Lunch Low carb spinach wrap, Dukes, mayo, spriong mix, low fat swiss, 3 slices [place], 4 small dill pickles, ranch veggie sticks    Dinner [employer], brussel sprouts, carrots, 3 sugar free PB cups      Exercise   Exercise Type ADL's;Strenuous (running)    How many days per week to you exercise? 6    How many minutes per day do you exercise? 90    Total minutes per week of exercise 540      Patient Education   Physical activity and exercise  Identified with patient nutritional and/or medication changes  necessary with exercise.    Monitoring Identified appropriate SMBG and/or A1C goals.    Psychosocial adjustment Role of stress on diabetes      Individualized Goals (developed by patient)   Nutrition General guidelines for healthy choices and portions discussed    Physical Activity Exercise 5-7 days per week    Medications take my medication as prescribed    Monitoring  test my blood glucose as discussed      Patient Self-Evaluation of Goals - Patient rates self as meeting previously set goals (% of time)   Nutrition >75%    Physical Activity >75%    Medications >75%    Monitoring >75%    Problem Solving >75%    Reducing Risk >75%    Health Coping 25 - 50%   Pt tends to dwell on worst case scenario     Post-Education Assessment   Patient understands the diabetes disease and treatment process. Demonstrates understanding / competency    Patient understands incorporating nutritional management into lifestyle. Demonstrates understanding / competency    Patient undertands incorporating physical activity into lifestyle. Demonstrates understanding / competency    Patient understands using medications safely. Demonstrates understanding / competency    Patient understands monitoring blood glucose, interpreting and using results Needs Review  Patient understands prevention, detection, and treatment of acute complications. Needs Review    Patient understands prevention, detection, and treatment of chronic complications. Needs Review    Patient understands how to develop strategies to address psychosocial issues. Needs Review    Patient understands how to develop strategies to promote health/change behavior. Demonstrates understanding / competency      Outcomes   Expected Outcomes Demonstrated interest in learning. Expect positive outcomes    Future DMSE 3-4 months    Program Status Not Completed      Subsequent Visit   Since your last visit have you continued or begun to take your  medications as prescribed? Yes    Since your last visit have you had your blood pressure checked? Yes    Is your most recent blood pressure lower, unchanged, or higher since your last visit? Unchanged    Since your last visit have you experienced any weight changes? Loss    Weight Loss (lbs) 12    Since your last visit, are you checking your blood glucose at least once a day? Yes             Individualized Plan for Diabetes Self-Management Training:   Learning Objective:  Patient will have a greater understanding of diabetes self-management. Patient education plan is to attend individual and/or group sessions per assessed needs and concerns.   Plan:   Patient Instructions  Continue to check your blood sugar each morning. Look for your numbers to stay between 70-100. Check your blood sugar 2 hours after you start a meal. Look for your numbers to stay under 140-150.  Continue to moderate your snacks and cravings. Choose higher fiber carbohydrates.  Keep up the great work exercising!  Congratulations on all your success! You are doing awesome!     Expected Outcomes:  Demonstrated interest in learning. Expect positive outcomes  Education material provided: N/A  If problems or questions, patient to contact team via:  Phone and Email  Future DSME appointment: 3-4 months

## 2020-12-05 NOTE — Patient Instructions (Addendum)
Continue to check your blood sugar each morning. Look for your numbers to stay between 70-100. Check your blood sugar 2 hours after you start a meal. Look for your numbers to stay under 140-150.  Continue to moderate your snacks and cravings. Choose higher fiber carbohydrates.  Keep up the great work exercising!  Congratulations on all your success! You are doing awesome!

## 2020-12-24 NOTE — Progress Notes (Signed)
Cardiology Office Note  Date:  12/26/2020   ID:  [name], Stefanie Stewart 11-Jun-1975, MRN [id number]  PCP:  [name], NP   Chief Complaint  Patient presents with   New Patient (Initial Visit)    HTN - hyperlipids    HPI:  [name] is a 45 year old woman with past medical history of Hyperlipidemia Hypertension with diabetes Morbid obesity smoking Covid 09/16/20 Who presents by referral from [facility] for consultation of her hyperlipidemia  Discussed prior history, she reports many years of smoking for at least 20 years, now does vape cigarettes  Prior history of poorly controlled diabetes, at the time was not on medications, eating poorly, had significant stressors at the time taking care of her mother On medications and with improved diet numbers are much better  Hyperlipidemia Possibly familial, reports trying statins in the past, had myalgias on Crestor Numbers much improved on PCSK9 inhibitor  Review of lab work A1c 11.3 in December 2021,  Medication changes,now  last A1c 5.8  Prior total cholesterol close to 300 Started on Repatha.  LDL is 31 and total cholesterol 81  insurance is requesting patient to see cardiology to continue coverage of Repatha -  Adopted Does not know family history  EKG personally reviewed by myself on todays visit Normal sinus rhythm rate 67 bpm no significant ST-T wave changes  PMH:   has a past medical history of Anxiety, Cervical syndrome, COPD (chronic obstructive pulmonary disease) ([place]), Depression, Edema (2013), Endometriosis, Hyperlipidemia, Hypertension, IBS (irritable bowel syndrome), Lump or mass in breast, Obesity, Plantar fibromatosis, Tobacco abuse, and Type 2 diabetes mellitus with morbid obesity ([place]) (03/24/2020).  PSH:    Past Surgical History:  Procedure Laterality Date   BREAST BIOPSY Left 02/03/2016   apocrine metaplasia    CERVICAL BIOPSY  W/ LOOP ELECTRODE EXCISION     CESAREAN SECTION  1996    COLPOSCOPY  2007   LEEP  2007    Current Outpatient Medications  Medication Sig Dispense Refill   aspirin-acetaminophen-caffeine (EXCEDRIN MIGRAINE) 250-250-65 MG tablet Take by mouth every 6 (six) hours as needed for headache.     blood glucose meter kit and supplies KIT Dispense based on patient and insurance preference. Use up to four times daily as directed. (FOR ICD-9 250.00, 250.01). 1 each 0   buPROPion (WELLBUTRIN XL) 150 MG 24 hr tablet Take 1 tablet (150 mg total) by mouth daily. 90 tablet 4   cholecalciferol (VITAMIN D3) 25 MCG (1000 UNIT) tablet Take 1,000 Units by mouth daily.     Cinnamon 500 MG capsule Take 500 mg by mouth 2 (two) times daily.     Evolocumab (REPATHA SURECLICK) 030 MG/ML SOAJ Inject 140 mg into the skin every 14 (fourteen) days. 1 mL 12   fexofenadine (ALLEGRA ALLERGY) 180 MG tablet Take 1 tablet (180 mg total) by mouth daily. 10 tablet 1   ibuprofen (ADVIL) 600 MG tablet Take 1 tablet (600 mg total) by mouth every 6 (six) hours as needed. 30 tablet 0   losartan (COZAAR) 25 MG tablet Take 1 tablet (25 mg total) by mouth daily. 90 tablet 4   metFORMIN (GLUCOPHAGE) 500 MG tablet Take 2 tablets (1,000 mg total) by mouth 2 (two) times daily with a meal. 360 tablet 4   methocarbamol (ROBAXIN) 500 MG tablet Take 500 mg by mouth 4 (four) times daily.     norethindrone (AYGESTIN) 5 MG tablet Take 1 tablet by mouth once daily 90 tablet 0  Omega-3 1000 MG CAPS Take 2 capsules by mouth daily.     sertraline (ZOLOFT) 50 MG tablet Take 1.5 tablets (75 mg total) by mouth daily. 90 tablet 4   TURMERIC PO Take 1 tablet by mouth daily.     benzonatate (TESSALON) 100 MG capsule Take 1 capsule (100 mg total) by mouth 2 (two) times daily as needed for cough. (Patient not taking: Reported on 12/26/2020) 20 capsule 0   Current Facility-Administered Medications  Medication Dose Route Frequency Provider Last Rate Last Admin   0.9 %  sodium chloride infusion  500 mL Intravenous  Continuous Ladene Artist, MD         Allergies:   Bactrim [sulfamethoxazole-trimethoprim] and Banana   Social History:  The patient  reports that she has quit smoking. Her smoking use included cigarettes. She has never used smokeless tobacco. She reports that she does not currently use drugs after having used the following drugs: Marijuana. She reports that she does not drink alcohol.   Family History:   family history includes COPD in her mother. She was adopted.    Review of Systems: Review of Systems  Constitutional: Negative.   HENT: Negative.    Respiratory: Negative.    Cardiovascular: Negative.   Gastrointestinal: Negative.   Musculoskeletal: Negative.   Neurological: Negative.   Psychiatric/Behavioral: Negative.    All other systems reviewed and are negative.   PHYSICAL EXAM: VS:  BP 110/64   Pulse 67   Ht _0  (1.727 m)   Wt 218 lb (98.9 kg)   SpO2 98%   BMI 33.15 kg/m  , BMI Body mass index is 33.15 kg/m. GEN: Well nourished, well developed, in no acute distress HEENT: normal Neck: no JVD, carotid bruits, or masses Cardiac: RRR; no murmurs, rubs, or gallops,no edema  Respiratory:  clear to auscultation bilaterally, normal work of breathing GI: soft, nontender, nondistended, + BS MS: no deformity or atrophy Skin: warm and dry, no rash Neuro:  Strength and sensation are intact Psych: euthymic mood, full affect   Recent Labs: 10/07/2020: ALT 17; BUN 14; Creatinine, Ser 0.69; Potassium 4.9; Sodium 140    Lipid Panel Lab Results  Component Value Date   CHOL 81 (L) 10/07/2020   HDL 26 (L) 10/07/2020   LDLCALC 31 10/07/2020   TRIG 134 10/07/2020      Wt Readings from Last 3 Encounters:  12/26/20 218 lb (98.9 kg)  12/05/20 216 lb 3.2 oz (98.1 kg)  11/30/20 217 lb (98.4 kg)      ASSESSMENT AND PLAN:  Problem List Items Addressed This Visit       Cardiology Problems   Hyperlipidemia associated with type 2 diabetes mellitus (HCC) (Chronic)    Hypertension associated with diabetes ([place]) - Primary (Chronic)     Other   Myalgia due to statin   Hyperlipidemia, familial Statin myalgias Dramatically improved numbers on Repatha High risk features including prior smoking history, history of diabetes Would recommend we continue Repatha at current dose.  Insurance cost of the medication unclear at this time  Diabetes type 2 Improvement in numbers with medications Stressed importance of continued lifestyle modification, weight loss  Hypertension Blood pressure is well controlled on today's visit. No changes made to the medications.  Former smoker Over 20 years of smoking cigarettes, now does vape .We have encouraged her to continue to work on weaning her cigarettes and smoking cessation. She will continue to work on this and does not want any assistance with chantix.  Preventive care Discussed CT coronary calcium scoring, she will call us if she would like this ordered   Total encounter time more than 60 minutes  Greater than 50% was spent in counseling and coordination of care with the patient  Patient was seen in consultation for Jolene [name] obesity referred back to her office for ongoing care of the issues detailed above  Signed, Esmond Plants, M.D., Ph.D. [place], [place]

## 2020-12-26 ENCOUNTER — Ambulatory Visit (INDEPENDENT_AMBULATORY_CARE_PROVIDER_SITE_OTHER): Payer: 59 | Admitting: Cardiovascular Disease

## 2020-12-26 ENCOUNTER — Other Ambulatory Visit: Payer: Self-pay | Admitting: Nurse Practitioner

## 2020-12-26 ENCOUNTER — Other Ambulatory Visit: Payer: Self-pay

## 2020-12-26 ENCOUNTER — Encounter: Payer: Self-pay | Admitting: Cardiovascular Disease

## 2020-12-26 VITALS — BP 110/64 | HR 67 | Ht 68.0 in | Wt 218.0 lb

## 2020-12-26 DIAGNOSIS — M791 Myalgia, unspecified site: Secondary | ICD-10-CM

## 2020-12-26 DIAGNOSIS — E7849 Other hyperlipidemia: Secondary | ICD-10-CM

## 2020-12-26 DIAGNOSIS — T466X5A Adverse effect of antihyperlipidemic and antiarteriosclerotic drugs, initial encounter: Secondary | ICD-10-CM

## 2020-12-26 DIAGNOSIS — I152 Hypertension secondary to endocrine disorders: Secondary | ICD-10-CM

## 2020-12-26 DIAGNOSIS — E1159 Type 2 diabetes mellitus with other circulatory complications: Secondary | ICD-10-CM

## 2020-12-26 DIAGNOSIS — E1169 Type 2 diabetes mellitus with other specified complication: Secondary | ICD-10-CM | POA: Diagnosis not present

## 2020-12-26 DIAGNOSIS — Z87891 Personal history of nicotine dependence: Secondary | ICD-10-CM

## 2020-12-26 DIAGNOSIS — E785 Hyperlipidemia, unspecified: Secondary | ICD-10-CM

## 2020-12-26 NOTE — Patient Instructions (Addendum)
Medication Instructions:  No changes  If you need a refill on your cardiac medications before your next appointment, please call your pharmacy.   Lab work: No new labs needed  Testing/Procedures: No new testing needed Please review CT Calcium Score handout  Follow-Up: At [facility], you and your health needs are our priority.  As part of our continuing mission to provide you with exceptional heart care, we have created designated Provider Care Teams.  These Care Teams include your primary Cardiologist (physician) and Advanced Practice Providers (APPs -  Physician Assistants and Nurse Practitioners) who all work together to provide you with the care you need, when you need it.  You will need a follow up appointment as needed  Providers on your designated Care Team:   Murray Hodgkins, NP Christell Faith, PA-C Marrianne Mood, PA-C Cadence Shickley, [place]  COVID-19 Vaccine Information can be found at: [email or web address] For questions related to vaccine distribution or appointments, please email [email or web address] or call [phone].

## 2020-12-27 NOTE — Telephone Encounter (Signed)
Filled yesterday by cardiologist. NT cannot refuse or refill.

## 2020-12-30 ENCOUNTER — Ambulatory Visit (INDEPENDENT_AMBULATORY_CARE_PROVIDER_SITE_OTHER): Payer: 59 | Admitting: Psychiatry

## 2020-12-30 ENCOUNTER — Other Ambulatory Visit: Payer: Self-pay

## 2020-12-30 DIAGNOSIS — F325 Major depressive disorder, single episode, in full remission: Secondary | ICD-10-CM | POA: Diagnosis not present

## 2020-12-30 DIAGNOSIS — F411 Generalized anxiety disorder: Secondary | ICD-10-CM | POA: Diagnosis not present

## 2020-12-30 DIAGNOSIS — Z634 Disappearance and death of family member: Secondary | ICD-10-CM

## 2020-12-30 DIAGNOSIS — R69 Illness, unspecified: Secondary | ICD-10-CM | POA: Diagnosis not present

## 2020-12-30 DIAGNOSIS — Z566 Other physical and mental strain related to work: Secondary | ICD-10-CM
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
## 2021-01-13 ENCOUNTER — Encounter: Payer: Self-pay | Admitting: Nurse Practitioner

## 2021-01-13 ENCOUNTER — Other Ambulatory Visit: Payer: Self-pay

## 2021-01-13 ENCOUNTER — Ambulatory Visit: Payer: 59 | Admitting: Nurse Practitioner

## 2021-01-13 ENCOUNTER — Ambulatory Visit (INDEPENDENT_AMBULATORY_CARE_PROVIDER_SITE_OTHER): Payer: 59 | Admitting: Nurse Practitioner

## 2021-01-13 VITALS — BP 133/85 | HR 64 | Temp 98.2°F | Resp 18 | Wt 210.0 lb

## 2021-01-13 DIAGNOSIS — E559 Vitamin D deficiency, unspecified: Secondary | ICD-10-CM | POA: Diagnosis not present

## 2021-01-13 DIAGNOSIS — E785 Hyperlipidemia, unspecified: Secondary | ICD-10-CM

## 2021-01-13 DIAGNOSIS — Z23 Encounter for immunization: Secondary | ICD-10-CM

## 2021-01-13 DIAGNOSIS — E538 Deficiency of other specified B group vitamins: Secondary | ICD-10-CM

## 2021-01-13 DIAGNOSIS — E1169 Type 2 diabetes mellitus with other specified complication: Secondary | ICD-10-CM | POA: Diagnosis not present

## 2021-01-13 DIAGNOSIS — Z1159 Encounter for screening for other viral diseases: Secondary | ICD-10-CM

## 2021-01-13 DIAGNOSIS — I152 Hypertension secondary to endocrine disorders: Secondary | ICD-10-CM

## 2021-01-13 DIAGNOSIS — E1159 Type 2 diabetes mellitus with other circulatory complications: Secondary | ICD-10-CM | POA: Diagnosis not present

## 2021-01-13 DIAGNOSIS — Z114 Encounter for screening for human immunodeficiency virus [HIV]: Secondary | ICD-10-CM

## 2021-01-13 LAB — BAYER DCA HB A1C WAIVED: HB A1C (BAYER DCA - WAIVED): 5.5 % (ref 4.8–5.6)

## 2021-01-13 MED ORDER — METFORMIN HCL 500 MG PO TABS: 500.0000 mg | ORAL_TABLET | Freq: Every day | ORAL | 4 refills | Status: DC

## 2021-01-13 NOTE — Progress Notes (Signed)
Established Patient Office Visit  Subjective:  Patient ID: Stefanie Stewart, female    DOB: [birth year]  Age: 45 y.o. MRN: [id number]  CC:  Chief Complaint  Patient presents with   Diabetes   Hyperlipidemia   Hypertension    HPI Shanecia Hoganson presents for follow-up on hypertension, diabetes, and hyperlipidemia.   HYPERTENSION / HYPERLIPIDEMIA  Satisfied with current treatment? yes Duration of hypertension: chronic BP monitoring frequency: daily BP range:  BP medication side effects: no Past BP meds: losartan (cozaar) Duration of hyperlipidemia: chronic Cholesterol medication side effects: no Cholesterol supplements: fish oil Medication compliance: excellent compliance Aspirin: no Recent stressors: no Recurrent headaches: no Visual changes: no Palpitations: no Dyspnea: no Chest pain: no Lower extremity edema: no Dizzy/lightheaded: no  DIABETES  Hypoglycemic episodes:no Polydipsia/polyuria: no Visual disturbance: no Chest pain: no Paresthesias: no Glucose Monitoring: yes  Accucheck frequency: Daily  Fasting glucose: 100s-110s  Post prandial:  Evening:  Before meals: Taking Insulin?: no  Long acting insulin:  Short acting insulin: Blood Pressure Monitoring: daily Retinal Examination: Not up to Date Foot Exam: Up to Date Diabetic Education: Completed Pneumovax: Up to Date Influenza: Up to Date Aspirin: no   Past Medical History:  Diagnosis Date   Anxiety    Cervical syndrome    COPD (chronic obstructive pulmonary disease) (HCC)    Depression    Edema 2013   legs   Endometriosis    Hyperlipidemia    Hypertension    IBS (irritable bowel syndrome)    Lump or mass in breast    left    Obesity    Plantar fibromatosis    Tobacco abuse    Type 2 diabetes mellitus with morbid obesity (HCC) 03/24/2020    Past Surgical History:  Procedure Laterality Date   BREAST BIOPSY Left 02/03/2016   apocrine metaplasia    CERVICAL BIOPSY  W/ LOOP  ELECTRODE EXCISION     CESAREAN SECTION  1996   COLPOSCOPY  2007   LEEP  2007    Family History  Adopted: Yes  Problem Relation Age of Onset   COPD Mother    Breast cancer Neg Hx     Social History   Socioeconomic History   Marital status: Single    Spouse name: Not on file   Number of children: 1   Years of education: Not on file   Highest education level: Associate degree: occupational, Scientist, product/process development, or vocational program  Occupational History   Not on file  Tobacco Use   Smoking status: Former    Packs/day: 0.00    Years: 20.00    Pack years: 0.00    Types: Cigarettes   Smokeless tobacco: Never  Vaping Use   Vaping Use: Every day   Substances: Nicotine, Flavoring   Devices: Zero Renvo  Substance and Sexual Activity   Alcohol use: Never   Drug use: Not Currently    Types: Marijuana    Comment: "from time to time"   Sexual activity: Not Currently    Birth control/protection: None  Other Topics Concern   Not on file  Social History Narrative   Not on file   Social Determinants of Health   Financial Resource Strain: Not on file  Food Insecurity: Not on file  Transportation Needs: Not on file  Physical Activity: Not on file  Stress: Not on file  Social Connections: Not on file  Intimate Partner Violence: Not on file    Outpatient Medications Prior to Visit  Medication  Sig Dispense Refill   aspirin-acetaminophen-caffeine (EXCEDRIN MIGRAINE) 250-250-65 MG tablet Take by mouth every 6 (six) hours as needed for headache.     benzonatate (TESSALON) 100 MG capsule Take 1 capsule (100 mg total) by mouth 2 (two) times daily as needed for cough. (Patient not taking: Reported on 12/26/2020) 20 capsule 0   blood glucose meter kit and supplies KIT Dispense based on patient and insurance preference. Use up to four times daily as directed. (FOR ICD-9 250.00, 250.01). 1 each 0   buPROPion (WELLBUTRIN XL) 150 MG 24 hr tablet Take 1 tablet (150 mg total) by mouth daily. 90  tablet 4   cholecalciferol (VITAMIN D3) 25 MCG (1000 UNIT) tablet Take 1,000 Units by mouth daily.     Cinnamon 500 MG capsule Take 500 mg by mouth 2 (two) times daily.     Evolocumab (REPATHA SURECLICK) 962 MG/ML SOAJ Inject 140 mg into the skin every 14 (fourteen) days. 1 mL 12   fexofenadine (ALLEGRA ALLERGY) 180 MG tablet Take 1 tablet (180 mg total) by mouth daily. 10 tablet 1   ibuprofen (ADVIL) 600 MG tablet Take 1 tablet (600 mg total) by mouth every 6 (six) hours as needed. 30 tablet 0   losartan (COZAAR) 25 MG tablet Take 1 tablet (25 mg total) by mouth daily. 90 tablet 4   methocarbamol (ROBAXIN) 500 MG tablet TAKE 1 TABLET BY MOUTH EVERY 8 HOURS AS NEEDED FOR MUSCLE SPASM 30 tablet 0   norethindrone (AYGESTIN) 5 MG tablet Take 1 tablet by mouth once daily 90 tablet 0   Omega-3 1000 MG CAPS Take 2 capsules by mouth daily.     sertraline (ZOLOFT) 50 MG tablet Take 1.5 tablets (75 mg total) by mouth daily. 90 tablet 4   TURMERIC PO Take 1 tablet by mouth daily.     metFORMIN (GLUCOPHAGE) 500 MG tablet Take 2 tablets (1,000 mg total) by mouth 2 (two) times daily with a meal. 360 tablet 4   0.9 %  sodium chloride infusion      No facility-administered medications prior to visit.    Allergies  Allergen Reactions   Bactrim [Sulfamethoxazole-Trimethoprim] Itching    Itching (skin and throat). Denies SOB or throat closing.   Banana Other (See Comments)    Reports lip swelling. Every now and then pt states she has nausea and vomiting.    ROS Review of Systems  Constitutional: Negative.   HENT: Negative.    Respiratory: Negative.    Cardiovascular: Negative.   Gastrointestinal: Negative.   Genitourinary: Negative.   Musculoskeletal: Negative.   Skin: Negative.   Neurological: Negative.      Objective:    Physical Exam Vitals and nursing note reviewed.  Constitutional:      General: She is not in acute distress.    Appearance: Normal appearance.  HENT:     Head:  Normocephalic and atraumatic.  Eyes:     Conjunctiva/sclera: Conjunctivae normal.  Cardiovascular:     Rate and Rhythm: Normal rate and regular rhythm.     Pulses: Normal pulses.     Heart sounds: Normal heart sounds.  Pulmonary:     Effort: Pulmonary effort is normal.     Breath sounds: Normal breath sounds.  Abdominal:     Palpations: Abdomen is soft.     Tenderness: There is no abdominal tenderness.  Musculoskeletal:     Cervical back: Normal range of motion.  Skin:    General: Skin is warm and dry.  Neurological:  General: No focal deficit present.     Mental Status: She is alert and oriented to person, place, and time.  Psychiatric:        Mood and Affect: Mood normal.        Behavior: Behavior normal.        Thought Content: Thought content normal.        Judgment: Judgment normal.    BP 133/85 (BP Location: Left Arm, Patient Position: Sitting)   Pulse 64   Temp 98.2 F (36.8 C) (Oral)   Resp 18   Wt 210 lb (95.3 kg)   SpO2 98%   BMI 31.93 kg/m  Wt Readings from Last 3 Encounters:  01/13/21 210 lb (95.3 kg)  12/26/20 218 lb (98.9 kg)  12/05/20 216 lb 3.2 oz (98.1 kg)     Health Maintenance Due  Topic Date Due   OPHTHALMOLOGY EXAM  Never done   COVID-19 Vaccine (3 - Pfizer risk series) 08/11/2019    There are no preventive care reminders to display for this patient.  Lab Results  Component Value Date   TSH 2.220 08/09/2017   Lab Results  Component Value Date   WBC 7.5 08/09/2017   HGB 12.7 08/09/2017   HCT 38.9 08/09/2017   MCV 88 08/09/2017   PLT 346 08/09/2017   Lab Results  Component Value Date   NA 140 10/07/2020   K 4.9 10/07/2020   CO2 23 10/07/2020   GLUCOSE 119 (H) 10/07/2020   BUN 14 10/07/2020   CREATININE 0.69 10/07/2020   BILITOT 0.6 10/07/2020   ALKPHOS 42 (L) 10/07/2020   AST 10 10/07/2020   ALT 17 10/07/2020   PROT 6.6 10/07/2020   ALBUMIN 4.6 10/07/2020   CALCIUM 9.6 10/07/2020   EGFR 108 10/07/2020   Lab Results   Component Value Date   CHOL 81 (L) 10/07/2020   Lab Results  Component Value Date   HDL 26 (L) 10/07/2020   Lab Results  Component Value Date   LDLCALC 31 10/07/2020   Lab Results  Component Value Date   TRIG 134 10/07/2020   No results found for: CHOLHDL Lab Results  Component Value Date   HGBA1C 5.8 10/07/2020      Assessment & Plan:   Problem List Items Addressed This Visit       Cardiovascular and Mediastinum   Hypertension associated with diabetes ([place]) (Chronic)    Chronic, stable. Continue current regimen. Check CMP, CBC, and TSH today. Follow up in 3 months.       Relevant Medications   metFORMIN (GLUCOPHAGE) 500 MG tablet   Other Relevant Orders   Bayer DCA Hb A1c Waived   CBC with Differential/Platelet   TSH     Endocrine   Hyperlipidemia associated with type 2 diabetes mellitus ([place]) (Chronic)    Controlled well on repatha. Was referred to Dr. Rockey Situ with cardiology per insurance request to continue repatha coverage. Check lipid panel today. She has 2 injections left, will reach out to Dr. Rockey Situ and see if his office was able to arrange refills.       Relevant Medications   metFORMIN (GLUCOPHAGE) 500 MG tablet   Other Relevant Orders   Bayer DCA Hb A1c Waived   Comprehensive metabolic panel   Lipid Panel w/o Chol/HDL Ratio   Type 2 diabetes mellitus with morbid obesity (HCC) (Chronic)    A1C today is 5.5%, doing really well with lifestyle modifications including diet and exercise. She has lost 38 pounds in  the last 10 months. Congratulated her on this. We will decrease metformin down to $Remov'500mg'BwMfPH$  daily. Continue monitoring blood sugars daily. Follow up in 3 months.       Relevant Medications   metFORMIN (GLUCOPHAGE) 500 MG tablet   Other Relevant Orders   Bayer DCA Hb A1c Waived   Other Visit Diagnoses     Vitamin D deficiency    -  Primary   Check vitamin D today and treat based on results   Relevant Orders   VITAMIN D 25 Hydroxy (Vit-D  Deficiency, Fractures)   Vitamin B12 deficiency       Check vitamin B12 today and treat based on results   Relevant Orders   Vitamin B12   Need for hepatitis C screening test       Hepatitis C ordered today   Relevant Orders   Hepatitis C antibody   Encounter for screening for HIV       HIV ordered today   Relevant Orders   HIV Antibody (routine testing w rflx)   Need for immunization against influenza       Flu vaccine given today   Relevant Orders   Flu Vaccine QUAD 83mo+IM (Fluarix, Fluzone & Alfiuria Quad PF) (Completed)       Meds ordered this encounter  Medications   metFORMIN (GLUCOPHAGE) 500 MG tablet    Sig: Take 1 tablet (500 mg total) by mouth daily with breakfast.    Dispense:  360 tablet    Refill:  4     Follow-up: Return in about 3 months (around 04/15/2021) for diabetes.    Charyl Dancer, NP

## 2021-01-13 NOTE — Assessment & Plan Note (Signed)
Controlled well on repatha. Was referred to Dr. Rockey Situ with cardiology per insurance request to continue repatha coverage. Check lipid panel today. She has 2 injections left, will reach out to Dr. Rockey Situ and see if his office was able to arrange refills.

## 2021-01-13 NOTE — Assessment & Plan Note (Signed)
Chronic, stable. Continue current regimen. Check CMP, CBC, and TSH today. Follow up in 3 months.

## 2021-01-13 NOTE — Assessment & Plan Note (Signed)
A1C today is 5.5%, doing really well with lifestyle modifications including diet and exercise. She has lost 38 pounds in the last 10 months. Congratulated her on this. We will decrease metformin down to 500mg  daily. Continue monitoring blood sugars daily. Follow up in 3 months.

## 2021-01-14 LAB — CBC WITH DIFFERENTIAL/PLATELET
Basophils Absolute: 0.1 10*3/uL (ref 0.0–0.2)
Basos: 1 %
EOS (ABSOLUTE): 0.1 10*3/uL (ref 0.0–0.4)
Eos: 1 %
Hematocrit: 40.7 % (ref 34.0–46.6)
Hemoglobin: 13.4 g/dL (ref 11.1–15.9)
Immature Grans (Abs): 0 10*3/uL (ref 0.0–0.1)
Immature Granulocytes: 0 %
Lymphocytes Absolute: 2 10*3/uL (ref 0.7–3.1)
Lymphs: 31 %
MCH: 29.4 pg (ref 26.6–33.0)
MCHC: 32.9 g/dL (ref 31.5–35.7)
MCV: 89 fL (ref 79–97)
Monocytes Absolute: 0.4 10*3/uL (ref 0.1–0.9)
Monocytes: 6 %
Neutrophils Absolute: 4 10*3/uL (ref 1.4–7.0)
Neutrophils: 61 %
Platelets: 345 10*3/uL (ref 150–450)
RBC: 4.56 x10E6/uL (ref 3.77–5.28)
RDW: 11.8 % (ref 11.7–15.4)
WBC: 6.5 10*3/uL (ref 3.4–10.8)

## 2021-01-14 LAB — COMPREHENSIVE METABOLIC PANEL
ALT: 19 IU/L (ref 0–32)
AST: 13 IU/L (ref 0–40)
Albumin/Globulin Ratio: 1.9 (ref 1.2–2.2)
Albumin: 4.6 g/dL (ref 3.8–4.8)
Alkaline Phosphatase: 40 IU/L — ABNORMAL LOW (ref 44–121)
BUN/Creatinine Ratio: 23 (ref 9–23)
BUN: 16 mg/dL (ref 6–24)
Bilirubin Total: 0.9 mg/dL (ref 0.0–1.2)
CO2: 22 mmol/L (ref 20–29)
Calcium: 9.1 mg/dL (ref 8.7–10.2)
Chloride: 104 mmol/L (ref 96–106)
Creatinine, Ser: 0.69 mg/dL (ref 0.57–1.00)
Globulin, Total: 2.4 g/dL (ref 1.5–4.5)
Glucose: 108 mg/dL — ABNORMAL HIGH (ref 70–99)
Potassium: 4.6 mmol/L (ref 3.5–5.2)
Sodium: 139 mmol/L (ref 134–144)
Total Protein: 7 g/dL (ref 6.0–8.5)
eGFR: 108 mL/min/{1.73_m2} (ref >59)

## 2021-01-14 LAB — LIPID PANEL W/O CHOL/HDL RATIO
Cholesterol, Total: 127 mg/dL (ref 100–199)
HDL: 26 mg/dL — ABNORMAL LOW (ref >39)
LDL Chol Calc (NIH): 77 mg/dL (ref 0–99)
Triglycerides: 136 mg/dL (ref 0–149)
VLDL Cholesterol Cal: 24 mg/dL (ref 5–40)

## 2021-01-14 LAB — VITAMIN D 25 HYDROXY (VIT D DEFICIENCY, FRACTURES): Vit D, 25-Hydroxy: 37.8 ng/mL (ref 30.0–100.0)

## 2021-01-14 LAB — HIV ANTIBODY (ROUTINE TESTING W REFLEX): HIV Screen 4th Generation wRfx: NONREACTIVE

## 2021-01-14 LAB — HEPATITIS C ANTIBODY: Hep C Virus Ab: <0.1 s/co ratio (ref 0.0–0.9)

## 2021-01-14 LAB — TSH: TSH: 2.19 u[IU]/mL (ref 0.450–4.500)

## 2021-01-14 LAB — VITAMIN B12: Vitamin B-12: 1516 pg/mL — ABNORMAL HIGH (ref 232–1245)

## 2021-01-16 ENCOUNTER — Telehealth: Payer: Self-pay

## 2021-01-16 ENCOUNTER — Other Ambulatory Visit: Payer: Self-pay

## 2021-01-16 MED ORDER — REPATHA SURECLICK 140 MG/ML ~~LOC~~ SOAJ: 140.0000 mg | SUBCUTANEOUS | 12 refills | Status: DC

## 2021-01-16 NOTE — Telephone Encounter (Signed)
Prior Authorization required for Repatha. PA completed in [email or web address] [id number]  RESPONSE:  Your information has been sent to [employer]. Please wait for [employer] 2017 to return a determination.

## 2021-01-16 NOTE — Telephone Encounter (Signed)
*  STAT* If patient is at the pharmacy, call can be transferred to refill team.   1. Which medications need to be refilled? (please list name of each medication and dose if known) Repatha  2. Which pharmacy/location (including street and city if local pharmacy) is medication to be sent to? [facility].  3. Do they need a 30 day or 90 day supply? [place]

## 2021-01-18 NOTE — Telephone Encounter (Signed)
Received notification PA has been approved for [facility]. Effective from 01/18/2021-01/17/2022

## 2021-02-03 ENCOUNTER — Ambulatory Visit (INDEPENDENT_AMBULATORY_CARE_PROVIDER_SITE_OTHER): Payer: 59 | Admitting: Psychiatry

## 2021-02-03 ENCOUNTER — Other Ambulatory Visit: Payer: Self-pay

## 2021-02-03 DIAGNOSIS — Z566 Other physical and mental strain related to work: Secondary | ICD-10-CM

## 2021-02-03 DIAGNOSIS — Z630 Problems in relationship with spouse or partner: Secondary | ICD-10-CM

## 2021-02-03 DIAGNOSIS — E119 Type 2 diabetes mellitus without complications: Secondary | ICD-10-CM

## 2021-02-03 DIAGNOSIS — F411 Generalized anxiety disorder: Secondary | ICD-10-CM
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
## 2021-02-27 ENCOUNTER — Other Ambulatory Visit: Payer: Self-pay | Admitting: Nurse Practitioner

## 2021-02-27 NOTE — Telephone Encounter (Signed)
.   Requested Prescriptions  Pending Prescriptions Disp Refills  . methocarbamol (ROBAXIN) 500 MG tablet [Pharmacy Med Name: Methocarbamol 500 MG Oral Tablet] 30 tablet 0    Sig: TAKE 1 TABLET BY MOUTH EVERY 8 HOURS AS NEEDED FOR MUSCLE SPASM     Not Delegated - Analgesics:  Muscle Relaxants Failed - 02/27/2021  8:47 AM      Failed - This refill cannot be delegated      Passed - Valid encounter within last 6 months    Recent Outpatient Visits          1 month ago Vitamin D deficiency   [place] Mc[place]e, Lauren A, NP   4 months ago Type 2 diabetes mellitus with morbid obesity ([place])   [place] [name], Henrine Screws T, NP   5 months ago COVID-90   [employer], Avanti, MD   8 months ago Type 2 diabetes mellitus with proteinuria ([place])   [place], Jolene T, NP   9 months ago Type 2 diabetes mellitus with proteinuria ([place])   [place], [name], NP      Future Appointments            In 3 weeks [name], [name], NP [employer]           . norethindrone (AYGESTIN) 5 MG tablet [Pharmacy Med Name: Norethindrone Acetate 5 MG Oral Tablet] 90 tablet 0    Sig: Take 1 tablet by mouth once daily     OB/GYN: Contraceptives - Progestins Passed - 02/27/2021  8:47 AM      Passed - Valid encounter within last 12 months    Recent Outpatient Visits          1 month ago Vitamin D deficiency   [place], Lauren A, NP   4 months ago Type 2 diabetes mellitus with morbid obesity ([place])   [place] [name], Henrine Screws T, NP   5 months ago COVID-35   [place], MD   8 months ago Type 2 diabetes mellitus with proteinuria ([place])   [place], Jolene T, NP   9 months ago Type 2 diabetes mellitus with proteinuria ([place])   [place], [name], NP      Future Appointments            In 3  weeks [name], [name], NP [employer]

## 2021-02-27 NOTE — Telephone Encounter (Signed)
Requested medications are due for refill today.  yes  Requested medications are on the active medications list.  yes  Last refill. 12/27/2020  Future visit scheduled.   yes  Notes to clinic.  Medication not delegated.

## 2021-03-03 ENCOUNTER — Other Ambulatory Visit: Payer: Self-pay

## 2021-03-03 ENCOUNTER — Ambulatory Visit (INDEPENDENT_AMBULATORY_CARE_PROVIDER_SITE_OTHER): Payer: 59 | Admitting: Psychiatry

## 2021-03-03 DIAGNOSIS — Z599 Problem related to housing and economic circumstances, unspecified: Secondary | ICD-10-CM

## 2021-03-03 DIAGNOSIS — Z566 Other physical and mental strain related to work: Secondary | ICD-10-CM

## 2021-03-03 DIAGNOSIS — F411 Generalized anxiety disorder: Secondary | ICD-10-CM

## 2021-03-03 DIAGNOSIS — Z630 Problems in relationship with spouse or partner: Secondary | ICD-10-CM
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
## 2021-03-23 ENCOUNTER — Telehealth: Payer: Self-pay | Admitting: Nurse Practitioner

## 2021-03-23 NOTE — Telephone Encounter (Signed)
Patient has appt on 12/30 with labs. She want to to know can testing be done also for premenopausal.

## 2021-03-24 ENCOUNTER — Other Ambulatory Visit: Payer: Self-pay

## 2021-03-24 ENCOUNTER — Encounter: Payer: Self-pay | Admitting: Nurse Practitioner

## 2021-03-24 ENCOUNTER — Ambulatory Visit (INDEPENDENT_AMBULATORY_CARE_PROVIDER_SITE_OTHER): Payer: 59 | Admitting: Nurse Practitioner

## 2021-03-24 ENCOUNTER — Ambulatory Visit: Payer: 59 | Admitting: Nurse Practitioner

## 2021-03-24 DIAGNOSIS — E1169 Type 2 diabetes mellitus with other specified complication: Secondary | ICD-10-CM | POA: Diagnosis not present

## 2021-03-24 DIAGNOSIS — E6609 Other obesity due to excess calories: Secondary | ICD-10-CM

## 2021-03-24 DIAGNOSIS — E1129 Type 2 diabetes mellitus with other diabetic kidney complication: Secondary | ICD-10-CM

## 2021-03-24 DIAGNOSIS — I152 Hypertension secondary to endocrine disorders: Secondary | ICD-10-CM

## 2021-03-24 DIAGNOSIS — F320 Major depressive disorder, single episode, mild: Secondary | ICD-10-CM

## 2021-03-24 DIAGNOSIS — E785 Hyperlipidemia, unspecified: Secondary | ICD-10-CM

## 2021-03-24 DIAGNOSIS — E1159 Type 2 diabetes mellitus with other circulatory complications: Secondary | ICD-10-CM

## 2021-03-24 DIAGNOSIS — Z6831 Body mass index (BMI) 31.0-31.9, adult: Secondary | ICD-10-CM

## 2021-03-24 DIAGNOSIS — R809 Proteinuria, unspecified: Secondary | ICD-10-CM

## 2021-03-24 DIAGNOSIS — N959 Unspecified menopausal and perimenopausal disorder: Secondary | ICD-10-CM

## 2021-03-24 DIAGNOSIS — T466X5A Adverse effect of antihyperlipidemic and antiarteriosclerotic drugs, initial encounter: Secondary | ICD-10-CM

## 2021-03-24 DIAGNOSIS — M791 Myalgia, unspecified site: Secondary | ICD-10-CM

## 2021-03-24 DIAGNOSIS — Z720 Tobacco use: Secondary | ICD-10-CM

## 2021-03-24 LAB — BAYER DCA HB A1C WAIVED: HB A1C (BAYER DCA - WAIVED): 5.5 % (ref 4.8–5.6)

## 2021-03-24 NOTE — Telephone Encounter (Signed)
Patient is seen here in office today.

## 2021-03-24 NOTE — Assessment & Plan Note (Signed)
History of myalgia, even with 3 day a week Crestor.  At this time continue Repatha, which she is tolerating well, for lipid lowering and stroke prevention.  Have seen great benefit in LDL levels with this.

## 2021-03-24 NOTE — Assessment & Plan Note (Signed)
Chronic, stable with BP at goal.  At this continue Losartan 25 MG daily and then adjust in future as needed -- may consider discontinuation in future if levels improve further.  Discussed benefit of ARB for kidney protection with proteinuria.  Recommend she monitor BP at least a few mornings a week at home and document.  DASH diet at home.  Labs up to date.  Return in 6 months.

## 2021-03-24 NOTE — Assessment & Plan Note (Signed)
Chronic, ongoing.  Continue current medication regimen and adjust as needed.  Denies SI/HI. Return to office in 6 months. 

## 2021-03-24 NOTE — Progress Notes (Addendum)
BP 112/78    Pulse 80    Temp 99.2 F (37.3 C) (Oral)    Ht 5\' 8"  (1.727 m)    Wt 206 lb 3.2 oz (93.5 kg)    SpO2 97%    BMI 31.35 kg/m    Subjective:    Patient ID: Stefanie Stewart, female    DOB: 1975/10/26, 45 y.o.   MRN: [id number]  HPI: Stefanie Stewart is a 45 y.o. female  Chief Complaint  Patient presents with   Diabetes    Patient states she is doing well as far her Diabetes. Patient states she has a lot of questions regarding her Diabetes. Patient states she has more questions about Diabetes in the future and medications longevity (long term). Patient denies having a recent Diabetic Eye Exam.    Menopause    Patient states she has a lot of questions regarding Menopause as far as her age.    Medication Management    Patient would like to discuss her Metformin and possibly stopping the medication altogether as she has been cutting them in half.    Ear Problem    Patient states she would like to have both of her ears checked as she has always had issues with swimmers ear and it sounds like she has water in her R ear. Patient states she hears water in her R ear and would like to discuss being referred to ENT.    She would like hormone levels checked today to assess for pre menopause -- reports some mood changes, hot flashes, and inability to lose weight easily.    DIABETES A1c in December 2021 was 11.3 and urine ALB 150 -- started on Metformin 1000 MG BID -- currently is taking 500 MG daily.  Taking Losartan 25 MG for BP and albuminuria. Last A1c 5.5% in October.  She continues to work-out and work on diet changes. Hypoglycemic episodes:no Polydipsia/polyuria: no Visual disturbance: no Chest pain: no Paresthesias: no Glucose Monitoring: yes  Accucheck frequency: Daily  Fasting glucose:  this morning 106 -- sometimes 109, never more then 115  Post prandial:  Evening:  Before meals: Taking Insulin?: no  Long acting insulin:  Short acting insulin: Blood Pressure  Monitoring: not checking Retinal Examination: Not Up to Date Foot Exam: Up to Date Diabetic Education: yes has attended 2 Pneumovax: Up To Date Influenza: Up To Date Aspirin: no   HYPERTENSION / HYPERLIPIDEMIA Taking Losartan 25 MG for BP and kidney protection.  Taking Repatha for HLD -- as did not tolerate statins, even on 1 or 3 day a week schedule.  Repatha has shown to be very beneficial at lowering levels and she has been tolerating well.    Continues to vape, but has cut back. Satisfied with current treatment? yes Duration of hypertension: chronic BP monitoring frequency: not checking BP range:  BP medication side effects: no Duration of hyperlipidemia: chronic Cholesterol medication side effects: no Cholesterol supplements: none Medication compliance: good compliance Aspirin: no Recent stressors: no Recurrent headaches: no Visual changes: no Palpitations: no Dyspnea: no Chest pain: no Lower extremity edema: no Dizzy/lightheaded: no  The ASCVD Risk score (Arnett DK, et al., 2019) failed to calculate for the following reasons:   The valid total cholesterol range is 130 to 320 mg/dL   EAR ISSUES Complains of ongoing ear issues, like water in them and would like to see ENT in future, but not at present.  Right ear is always catching water, has had long term  issues with this. Duration: weeks Involved ear(s): right Severity:  no pain Quality:  no pain Fever: no Otorrhea: no Pruritus: none Hearing loss: no Water immersion no Using Q-tips: yes Recurrent otitis media: no Status: stable Treatments attempted: none   DEPRESSION Continues on Sertraline 75 MG daily and Wellbutrin. Mood status: stable Satisfied with current treatment?: yes Symptom severity: mild  Duration of current treatment : chronic Side effects: no Medication compliance: good compliance Depressed mood: no Anxious mood: no Anhedonia: no Significant weight loss or gain: no Insomnia:  none Fatigue: no Feelings of worthlessness or guilt: no Impaired concentration/indecisiveness: no Suicidal ideations: no Hopelessness: no Crying spells: no Depression screen [facility] 2/9 03/24/2021 10/07/2020 05/09/2020 04/04/2020 03/24/2020  Decreased Interest 0 0 0 0 3  Down, Depressed, Hopeless 0 0 0 0 3  PHQ - 2 Score 0 0 0 0 6  Altered sleeping 0 0 0 - 0  Tired, decreased energy 0 0 0 - 3  Change in appetite 0 0 0 - 1  Feeling bad or failure about yourself  0 0 0 - 1  Trouble concentrating 0 0 0 - 2  Moving slowly or fidgety/restless 0 0 0 - 0  Suicidal thoughts 0 0 0 - 0  PHQ-9 Score 0 0 0 - 13  Difficult doing work/chores Not difficult at all Not difficult at all - - Very difficult  Some recent data might be hidden     Relevant past medical, surgical, family and social history reviewed and updated as indicated. Interim medical history since our last visit reviewed. Allergies and medications reviewed and updated.  Review of Systems  Constitutional:  Negative for activity change, appetite change, diaphoresis, fatigue and fever.  Respiratory:  Negative for cough, chest tightness and shortness of breath.   Cardiovascular:  Negative for chest pain, palpitations and leg swelling.  Gastrointestinal: Negative.   Endocrine: Negative for polydipsia, polyphagia and polyuria.  Neurological: Negative.   Psychiatric/Behavioral: Negative.     Per HPI unless specifically indicated above     Objective:    BP 112/78    Pulse 80    Temp 99.2 F (37.3 C) (Oral)    Ht $R'5\' 8"'Bv$  (1.727 m)    Wt 206 lb 3.2 oz (93.5 kg)    SpO2 97%    BMI 31.35 kg/m   Wt Readings from Last 3 Encounters:  03/24/21 206 lb 3.2 oz (93.5 kg)  01/13/21 210 lb (95.3 kg)  12/26/20 218 lb (98.9 kg)    Physical Exam Vitals and nursing note reviewed.  Constitutional:      General: She is awake. She is not in acute distress.    Appearance: She is well-developed and well-groomed. She is obese. She is not ill-appearing.   HENT:     Head: Normocephalic.     Right Ear: Hearing, tympanic membrane, ear canal and external ear normal.     Left Ear: Hearing, tympanic membrane, ear canal and external ear normal.  Eyes:     General: Lids are normal.        Right eye: No discharge.        Left eye: No discharge.     Conjunctiva/sclera: Conjunctivae normal.     Pupils: Pupils are equal, round, and reactive to light.  Neck:     Thyroid: No thyromegaly.     Vascular: No carotid bruit.  Cardiovascular:     Rate and Rhythm: Normal rate and regular rhythm.     Heart sounds: Normal heart sounds.  No murmur heard.   No gallop.  Pulmonary:     Effort: Pulmonary effort is normal. No accessory muscle usage or respiratory distress.     Breath sounds: Normal breath sounds.  Abdominal:     General: Bowel sounds are normal.     Palpations: Abdomen is soft. There is no hepatomegaly or splenomegaly.  Musculoskeletal:     Right shoulder: Normal.     Left shoulder: No swelling, effusion, laceration, tenderness, bony tenderness or crepitus. Normal range of motion. Normal strength.     Cervical back: Normal range of motion and neck supple.     Right lower leg: No edema.     Left lower leg: No edema.  Lymphadenopathy:     Cervical: No cervical adenopathy.  Skin:    General: Skin is warm and dry.  Neurological:     Mental Status: She is alert and oriented to person, place, and time.  Psychiatric:        Attention and Perception: Attention normal.        Mood and Affect: Mood normal.        Speech: Speech normal.        Behavior: Behavior normal. Behavior is cooperative.        Thought Content: Thought content normal.   Diabetic Foot Exam - Simple   Simple Foot Form Visual Inspection No deformities, no ulcerations, no other skin breakdown bilaterally: Yes Sensation Testing Intact to touch and monofilament testing bilaterally: Yes Pulse Check Posterior Tibialis and Dorsalis pulse intact bilaterally: Yes Comments     Results for orders placed or performed in visit on 01/13/21  Bayer DCA Hb A1c Waived  Result Value Ref Range   HB A1C (BAYER DCA - WAIVED) 5.5 4.8 - 5.6 %  Comprehensive metabolic panel  Result Value Ref Range   Glucose 108 (H) 70 - 99 mg/dL   BUN 16 6 - 24 mg/dL   Creatinine, Ser 0.69 0.57 - 1.00 mg/dL   eGFR 108 >59 mL/min/1.73   BUN/Creatinine Ratio 23 9 - 23   Sodium 139 134 - 144 mmol/L   Potassium 4.6 3.5 - 5.2 mmol/L   Chloride 104 96 - 106 mmol/L   CO2 22 20 - 29 mmol/L   Calcium 9.1 8.7 - 10.2 mg/dL   Total Protein 7.0 6.0 - 8.5 g/dL   Albumin 4.6 3.8 - 4.8 g/dL   Globulin, Total 2.4 1.5 - 4.5 g/dL   Albumin/Globulin Ratio 1.9 1.2 - 2.2   Bilirubin Total 0.9 0.0 - 1.2 mg/dL   Alkaline Phosphatase 40 (L) 44 - 121 IU/L   AST 13 0 - 40 IU/L   ALT 19 0 - 32 IU/L  CBC with Differential/Platelet  Result Value Ref Range   WBC 6.5 3.4 - 10.8 x10E3/uL   RBC 4.56 3.77 - 5.28 x10E6/uL   Hemoglobin 13.4 11.1 - 15.9 g/dL   Hematocrit 40.7 34.0 - 46.6 %   MCV 89 79 - 97 fL   MCH 29.4 26.6 - 33.0 pg   MCHC 32.9 31.5 - 35.7 g/dL   RDW 11.8 11.7 - 15.4 %   Platelets 345 150 - 450 x10E3/uL   Neutrophils 61 Not Estab. %   Lymphs 31 Not Estab. %   Monocytes 6 Not Estab. %   Eos 1 Not Estab. %   Basos 1 Not Estab. %   Neutrophils Absolute 4.0 1.4 - 7.0 x10E3/uL   Lymphocytes Absolute 2.0 0.7 - 3.1 x10E3/uL   Monocytes Absolute 0.4  0.1 - 0.9 x10E3/uL   EOS (ABSOLUTE) 0.1 0.0 - 0.4 x10E3/uL   Basophils Absolute 0.1 0.0 - 0.2 x10E3/uL   Immature Granulocytes 0 Not Estab. %   Immature Grans (Abs) 0.0 0.0 - 0.1 x10E3/uL  TSH  Result Value Ref Range   TSH 2.190 0.450 - 4.500 uIU/mL  Lipid Panel w/o Chol/HDL Ratio  Result Value Ref Range   Cholesterol, Total 127 100 - 199 mg/dL   Triglycerides 136 0 - 149 mg/dL   HDL 26 (L) >39 mg/dL   VLDL Cholesterol Cal 24 5 - 40 mg/dL   LDL Chol Calc (NIH) 77 0 - 99 mg/dL  VITAMIN D 25 Hydroxy (Vit-D Deficiency, Fractures)  Result Value  Ref Range   Vit D, 25-Hydroxy 37.8 30.0 - 100.0 ng/mL  Vitamin B12  Result Value Ref Range   Vitamin B-12 1,516 (H) 232 - 1,245 pg/mL  Hepatitis C antibody  Result Value Ref Range   Hep C Virus Ab <0.1 0.0 - 0.9 s/co ratio  HIV Antibody (routine testing w rflx)  Result Value Ref Range   HIV Screen 4th Generation wRfx Non Reactive Non Reactive      Assessment & Plan:   Problem List Items Addressed This Visit       Cardiovascular and Mediastinum   Hypertension associated with diabetes (HCC) (Chronic)    Chronic, stable with BP at goal.  At this continue Losartan 25 MG daily and then adjust in future as needed -- may consider discontinuation in future if levels improve further.  Discussed benefit of ARB for kidney protection with proteinuria.  Recommend she monitor BP at least a few mornings a week at home and document.  DASH diet at home.  Labs up to date.  Return in 6 months.       Relevant Orders   Bayer DCA Hb A1c Waived     Endocrine   Hyperlipidemia associated with type 2 diabetes mellitus (HCC) (Chronic)    Chronic, ongoing.  History of poor tolerance statin.  Continue Repatha at this time, which she is tolerating.  Lipid panel up to date.  Return in 6 months.        Relevant Orders   Bayer DCA Hb A1c Waived   Type 2 diabetes mellitus with morbid obesity ([place]) - Primary (Chronic)    New diagnosis A1c 11.3 end of December 2021, today A1c much improved at 5.5%, praised for continued success.  Urine ALB 150 last visit and recent improved at 80.  Continue Metformin at 500 MG daily -- will further reduce to discontinue if ongoing improvement next visit.  Continue Losartan 25 MG daily for kidney protection and continue Repatha for cholesterol reduction. Educated on blood sugar goals, less then 130 fasting in morning and <180 two hours after a meal.  Return in 6 months.      Relevant Orders   Bayer DCA Hb A1c Waived   Type 2 diabetes mellitus with proteinuria (HCC) (Chronic)     Refer to diabetes with obesity plan.      Relevant Orders   Bayer DCA Hb A1c Waived     Other   Depression (Chronic)    Chronic, ongoing.  Continue current medication regimen and adjust as needed.  Denies SI/HI. Return to office in 6 months.      Myalgia due to statin    History of myalgia, even with 3 day a week Crestor.  At this time continue Repatha, which she is tolerating well, for lipid  lowering and stroke prevention.  Have seen great benefit in LDL levels with this.        Obesity    BMI 31.35, praised for ongoing weight loss.  Recommended eating smaller high protein, low fat meals more frequently and exercising 30 mins a day 5 times a week with a goal of 10-15lb weight loss in the next 3 months. Patient voiced their understanding and motivation to adhere to these recommendations.       Vapes nicotine containing substance    I have recommended complete cessation of tobacco use. I have discussed various options available for assistance with tobacco cessation including over the counter methods (Nicotine gum, patch and lozenges). We also discussed prescription options (Chantix, Nicotine Inhaler / Nasal Spray). The patient is not interested in pursuing any prescription tobacco cessation options at this time.  Determine whether to start lung CT screening annually at age 25.       Other Visit Diagnoses     Premenopausal patient       Check hormone levels today, although discussed with her these are not always accurate and often this is based more on symptoms.   Relevant Orders   FSH/LH        Follow up plan: Return in about 6 months (around 09/22/2021) for T2DM, HTN/HLD.

## 2021-03-24 NOTE — Assessment & Plan Note (Signed)
New diagnosis A1c 11.3 end of December 2021, today A1c much improved at 5.5%, praised for continued success.  Urine ALB 150 last visit and recent improved at 80.  Continue Metformin at 500 MG daily -- will further reduce to discontinue if ongoing improvement next visit.  Continue Losartan 25 MG daily for kidney protection and continue Repatha for cholesterol reduction. Educated on blood sugar goals, less then 130 fasting in morning and <180 two hours after a meal.  Return in 6 months.

## 2021-03-24 NOTE — Patient Instructions (Signed)

## 2021-03-24 NOTE — Assessment & Plan Note (Signed)
I have recommended complete cessation of tobacco use. I have discussed various options available for assistance with tobacco cessation including over the counter methods (Nicotine gum, patch and lozenges). We also discussed prescription options (Chantix, Nicotine Inhaler / Nasal Spray). The patient is not interested in pursuing any prescription tobacco cessation options at this time.  Determine whether to start lung CT screening annually at age 45.  

## 2021-03-24 NOTE — Assessment & Plan Note (Signed)
BMI 31.35, praised for ongoing weight loss.  Recommended eating smaller high protein, low fat meals more frequently and exercising 30 mins a day 5 times a week with a goal of 10-15lb weight loss in the next 3 months. Patient voiced their understanding and motivation to adhere to these recommendations.

## 2021-03-24 NOTE — Assessment & Plan Note (Signed)
Refer to diabetes with obesity plan.

## 2021-03-24 NOTE — Assessment & Plan Note (Signed)
Chronic, ongoing.  History of poor tolerance statin.  Continue Repatha at this time, which she is tolerating.  Lipid panel up to date.  Return in 6 months.

## 2021-03-25 LAB — FSH/LH
FSH: 36.2 m[IU]/mL
LH: 18.8 m[IU]/mL

## 2021-03-25 NOTE — Progress Notes (Signed)
Contacted via [place] morning [name], your labs have returned.  Hormone levels show that you may be in peri menopausal phase.  Do you still have cycles?  Are they regular if so?  Any questions?  Have a Happy New Year!! Keep being amazing!!  Thank you for allowing me to participate in your care.  I appreciate you. Kindest regards, Gagandeep Pettet

## 2021-03-27 ENCOUNTER — Ambulatory Visit: Payer: 59 | Admitting: Nurse Practitioner

## 2021-03-31 ENCOUNTER — Other Ambulatory Visit: Payer: Self-pay

## 2021-03-31 ENCOUNTER — Ambulatory Visit (INDEPENDENT_AMBULATORY_CARE_PROVIDER_SITE_OTHER): Payer: 59 | Admitting: Psychiatry

## 2021-03-31 DIAGNOSIS — F411 Generalized anxiety disorder: Secondary | ICD-10-CM | POA: Diagnosis not present

## 2021-03-31 DIAGNOSIS — Z566 Other physical and mental strain related to work: Secondary | ICD-10-CM

## 2021-03-31 DIAGNOSIS — E119 Type 2 diabetes mellitus without complications: Secondary | ICD-10-CM | POA: Diagnosis not present

## 2021-03-31 NOTE — Progress Notes (Signed)
Psychotherapy Progress Note [facility] 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  Dog Evie died before Christmas (CHF, euthanized).  Eerily quiet without he parents, and Duke dying in August, then Evie lately.  3rd time around losing a chihuahua to CHF.  No moral dilemmas, just restless to get involved in things.  Working through some home cleanout, preparing to reposition herself in parents' bedroom.  Affirmed and encouraged.  Got ticketed for speeding (84 in 70).  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    Resolved will visit work Monday to reconnoiter and see.  Endorsed using the down time to refigure some furniture, get rid of excess and outdated stuff, and continuing to live into her freedom, both home and workplace if possible, but especially at home, as part of recovering from burning out taking care of others, and part of fully claiming this phase and this home as her own.  Therapeutic modalities: Cognitive Behavioral Therapy and Solution-Oriented/Positive Psychology  Mental Status/Observations:  Appearance:   Casual     Behavior:  Appropriate  Motor:  Normal  Speech/Language:   Clear and Coherent and mild pressure  Affect:  Appropriate  Mood:  anxious  Thought process:  normal  Thought  content:    WNL  Sensory/Perceptual disturbances:    WNL  Orientation:  Fully oriented  Attention:  Good    Concentration:  Fair  Memory:  WNL  Insight:    Good  Judgment:   Good  Impulse Control:  Good   Risk Assessment: Danger to Self: No Self-injurious Behavior: No Danger to Others: No Physical Aggression / Violence: No Duty to Warn: No Access to Firearms a concern: No  Assessment of progress:  progressing  Diagnosis:   ICD-10-CM   1. Generalized anxiety disorder  F41.1     2. Work stress  Z56.6     3. Type 2 diabetes mellitus without complication, without long-term current use of insulin (HCC)  E11.9      Plan:  Use down time to work through some more refitting the house to her wishes Grief activities for dog as needed Worth visiting workplace to see and appraise outlook for back-to-work, reduce wondering and worrying Continue fitness and diet program  Other recommendations/advice as may be noted above Continue to utilize previously learned skills ad lib Maintain medication as prescribed and work faithfully with relevant prescriber(s) if any changes are desired or seem indicated Call the clinic on-call service, 988/hotline, 911, or present to [facility] or ER if any life-threatening psychiatric crisis No follow-ups on file. Already scheduled visit in this office 05/05/2021.  Robley Fries, PhD Marliss Czar, PhD LP Clinical Psychologist, [facility] [address] [place], [place] [address] [phone]

## 2021-04-03 ENCOUNTER — Ambulatory Visit: Payer: 59 | Admitting: Dietician

## 2021-04-14 ENCOUNTER — Ambulatory Visit: Payer: 59 | Admitting: Nurse Practitioner

## 2021-04-21 LAB — HM DIABETES EYE EXAM

## 2021-04-27 ENCOUNTER — Encounter: Payer: Self-pay | Admitting: Nurse Practitioner

## 2021-05-01 ENCOUNTER — Other Ambulatory Visit: Payer: Self-pay | Admitting: Nurse Practitioner

## 2021-05-01 NOTE — Telephone Encounter (Signed)
Requested medications are due for refill today.  yes  Requested medications are on the active medications list.  yes  Last refill. 02/27/2021  Future visit scheduled.   yes  Notes to clinic.  Per chart pt is a smoker.    Requested Prescriptions  Pending Prescriptions Disp Refills   norethindrone (AYGESTIN) 5 MG tablet [Pharmacy Med Name: Norethindrone Acetate 5 MG Oral Tablet] 90 tablet 0    Sig: Take 1 tablet by mouth once daily     OB/GYN: Contraceptives - Progestins Passed - 05/01/2021  8:24 AM      Passed - Last BP in normal range    BP Readings from Last 1 Encounters:  03/24/21 112/78          Passed - Valid encounter within last 12 months    Recent Outpatient Visits           1 month ago Type 2 diabetes mellitus with morbid obesity ([place])   [place], Henrine Screws T, NP   3 months ago Vitamin D deficiency   [facility] McElwee, Lauren A, NP   6 months ago Type 2 diabetes mellitus with morbid obesity ([place])   [place], Henrine Screws T, NP   7 months ago COVID-38   [place], MD   10 months ago Type 2 diabetes mellitus with proteinuria ([place])   [place], [name], NP       Future Appointments             In 4 months [name], [name], NP [employer] [facility], [place] - Patient is not a smoker

## 2021-05-02 ENCOUNTER — Telehealth: Payer: Self-pay

## 2021-05-02 NOTE — Telephone Encounter (Signed)
Incoming fax from [place].  PA needed for Repatha -- Patients insurance has changed.   PA started through covermymeds  Stefanie Stewart (Key: [id number]) Repatha SureClick 140MG /ML auto-injectors  Awaiting decision.

## 2021-05-02 NOTE — Telephone Encounter (Signed)
Error

## 2021-05-03 ENCOUNTER — Telehealth: Payer: Self-pay

## 2021-05-03 NOTE — Telephone Encounter (Signed)
Called patient.  LMOV letting the patient know that I did resubmit the PA for repatha with her new insurance. I am waiting for an approval -- 48-72 hours.  That I would keep checking on it and will call her when we get a decision.

## 2021-05-03 NOTE — Telephone Encounter (Signed)
Please call to discuss Prior Authorization for Stefanie Stewart. She states she has new insurance. Patient states it is ok to leave a detailed message.

## 2021-05-03 NOTE — Telephone Encounter (Signed)
[facility] has not yet replied to your PA request. You may close this dialog, return to your dashboard, and perform other tasks.  To check for an update later, open this request again from your dashboard.  If [facility] has not replied within 72 hours for urgent requests and up to 15 days for standard requests, please contact [employer] [facility] at

## 2021-05-05 ENCOUNTER — Ambulatory Visit (INDEPENDENT_AMBULATORY_CARE_PROVIDER_SITE_OTHER): Payer: 59 | Admitting: Psychiatry

## 2021-05-05 ENCOUNTER — Other Ambulatory Visit: Payer: Self-pay

## 2021-05-05 DIAGNOSIS — N951 Menopausal and female climacteric states: Secondary | ICD-10-CM

## 2021-05-05 DIAGNOSIS — E119 Type 2 diabetes mellitus without complications: Secondary | ICD-10-CM | POA: Diagnosis not present

## 2021-05-05 DIAGNOSIS — Z599 Problem related to housing and economic circumstances, unspecified: Secondary | ICD-10-CM

## 2021-05-05 DIAGNOSIS — F411 Generalized anxiety disorder: Secondary | ICD-10-CM

## 2021-05-05 DIAGNOSIS — Z566 Other physical and mental strain related to work: Secondary | ICD-10-CM | POA: Diagnosis not present

## 2021-05-05 NOTE — Progress Notes (Unsigned)
Psychotherapy Progress Note [facility] 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  Still some uncertainties about whether he will get too sloppy, not yet to the risk of malpractice but close enough in some ways to worry.  Family trying to plan a weeklong reunion at [facility].  Has booked the time off despite worry about income.  Socially, not as involved as used to be but is getting to the gym and some time with friends.  Concerned with small weight gain (4#) and learned she is perimenopausal.  Trying to spend more gym time and walking on days available to combat weight.  Figures to do coolsculpting this month.  Some consideration of plastic surgery but really wants to avoid it if possible, but sagging skin is bothersome.  Managing b.s. well these days.  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    Considering fostering children or animals, or maybe serving as a big sister.  Oriented to what the requirements might be.    Therapeutic modalities: {AM:23362::"Cognitive Behavioral Therapy","Solution-Oriented/Positive Psychology"}  Mental Status/Observations:  Appearance:   {PSY:22683}     Behavior:  {PSY:21022743}  Motor:  {PSY:22302}  Speech/Language:   {PSY:22685}  Affect:  {PSY:22687}  Mood:  {PSY:31886}  Thought process:  {PSY:31888}  Thought content:    {PSY:[phone]}  Sensory/Perceptual disturbances:    {[phone]}  Orientation:  {Psych Orientation:23301::"Fully oriented"}  Attention:  {Good-Fair-Poor  ratings:23770::"Good"}    Concentration:  {Good-Fair-Poor ratings:23770::"Good"}  Memory:  {PSY:9292737658}  Insight:    {Good-Fair-Poor ratings:23770::"Good"}  Judgment:   {Good-Fair-Poor ratings:23770::"Good"}  Impulse Control:  {Good-Fair-Poor ratings:23770::"Good"}   Risk Assessment: Danger to Self: {Risk:22599::"No"} Self-injurious Behavior: {Risk:22599::"No"} Danger to Others: {Risk:22599::"No"} Physical Aggression / Violence: {Risk:22599::"No"} Duty to Warn: {AMYesNo:22526::"No"} Access to Firearms a concern: {AMYesNo:22526::"No"}  Assessment of progress:  {Progress:22147::"progressing"}  Diagnosis: No diagnosis found. Plan:  Option for self-defense class to improve sense of personal security Option angle glasses for neck help Option to look into  Other recommendations/advice as may be noted above Continue to utilize previously learned skills ad lib Maintain medication as prescribed and work faithfully with relevant prescriber(s) if any changes are desired or seem indicated Call the clinic on-call service, 988/hotline, 911, or present to [facility] or ER if any life-threatening psychiatric crisis Return for time at discretion. Already scheduled visit in this office 06/02/2021.  Blanchie Serve, PhD [name], PhD LP Clinical Psychologist, [facility] [address] [place], [place] [address] [phone]

## 2021-05-09 ENCOUNTER — Telehealth: Payer: Self-pay

## 2021-05-09 NOTE — Telephone Encounter (Signed)
Reviewed denial. 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 Request should have been submitted for primary prevention. Looks like her new insurance also requires her to have tried both atorvastatin and rosuvastatin before covering PCSK9i. From our records I can see that she tried rosuvastatin and pravastatin but not atorvastatin.

## 2021-05-09 NOTE — Telephone Encounter (Signed)
Incoming fax from [employer] [facility] Pt's Repatha for PA was DENIED  "Your medication request has been denied as it does not appear to meet medical necessary requirements. Plans rules require parameters (diagnosis, lab values, test result, physical exam findings etc) be met for medical necessity approval. Information submitted does not indicate required parameter results were met.   Forward to pharmD for review and recommendations.  Fax denial response for reference

## 2021-05-09 NOTE — Telephone Encounter (Signed)
Yes it looks like the PA was submitted under the wrong diagnosis - the denial letter stated the request was submitted for homozygous FH which is incorrect (patients with HoFH have LDLs typically > 400 from childhood). Would need to submit an appeals with correct indication for primary prevention. Otherwise could try submitting new prior authorization request with correct diagnosis for Praluent to see if that authorization goes through, all plans are a bit different in what criteria they require to cover the primary prevention indication. It looks like her new plan may require trial of atorvastatin first which I don't see in our records that she's tried before, so that could be an option as well.

## 2021-05-10 NOTE — Telephone Encounter (Signed)
Patient called today wanting an update on PA for Repatha.  Made her aware that we will be starting the appeals process for the PA because it was denied.  Made her aware that I will pull 2 pens for her as she is out for her to come pick up in the meantime.   Patient made me aware that when the PA is approved to send to [place] in [place] at friendly.

## 2021-05-10 NOTE — Telephone Encounter (Signed)
Medication Samples have been provided to the patient.  Drug name: repatha        Strength: 140        Qty: 2 pens  LOT: [id number]  Exp.Date: 05/2023  Janan Ridge 12:04 PM 05/10/2021

## 2021-05-10 NOTE — Telephone Encounter (Signed)
Called [phone] to get the appeals process started for this patients repatha.  Spoke with Erlene Quan -- he will be faxing over the appeals form

## 2021-05-11 ENCOUNTER — Other Ambulatory Visit: Payer: Self-pay

## 2021-05-11 MED ORDER — REPATHA SURECLICK 140 MG/ML ~~LOC~~ SOAJ: 140.0000 mg | SUBCUTANEOUS | 12 refills | Status: DC

## 2021-05-15 ENCOUNTER — Other Ambulatory Visit: Payer: Self-pay | Admitting: Nurse Practitioner

## 2021-05-16 ENCOUNTER — Telehealth: Payer: Self-pay | Admitting: Nurse Practitioner

## 2021-05-16 NOTE — Telephone Encounter (Signed)
Copied from [place] [phone]. Topic: General - Other >> May 16, 2021 10:59 AM Antonieta Iba C wrote: Reason for CRM: pt called in for assistance. Pt says that she is having UTI symptoms such as urgency to urinate, vaginal burning. Pt would like to know if provider would send in a Rx to her pharmacy?   Pharmacy: [facility], [place]  Phone:  [phone] Fax:  [phone] >> May 16, 2021 11:26 AM Street, Berenda Morale wrote: Let pt know that she would have to give urine sample and have an appt before she can get an antibiotic. I offered pt a 2 pm appt. Pt says she works and can't make it to appt. I let pt know that she will have to have an appt. 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

## 2021-05-16 NOTE — Telephone Encounter (Signed)
Requested Prescriptions  Pending Prescriptions Disp Refills   buPROPion (WELLBUTRIN XL) 150 MG 24 hr tablet [Pharmacy Med Name: buPROPion HCl ER (XL) 150 MG Oral Tablet Extended Release 24 Hour] 90 tablet 1    Sig: Take 1 tablet by mouth once daily     Psychiatry: Antidepressants - bupropion Passed - 05/15/2021  8:05 PM      Passed - Cr in normal range and within 360 days    Creat  Date Value Ref Range Status  05/14/2014 0.70 0.50 - 1.10 mg/dL Final   Creatinine, Ser  Date Value Ref Range Status  01/13/2021 0.69 0.57 - 1.00 mg/dL Final         Passed - AST in normal range and within 360 days    AST  Date Value Ref Range Status  01/13/2021 13 0 - 40 IU/L Final         Passed - ALT in normal range and within 360 days    ALT  Date Value Ref Range Status  01/13/2021 19 0 - 32 IU/L Final         Passed - Completed PHQ-2 or PHQ-9 in the last 360 days      Passed - Last BP in normal range    BP Readings from Last 1 Encounters:  03/24/21 112/78         Passed - Valid encounter within last 6 months    Recent Outpatient Visits          1 month ago Type 2 diabetes mellitus with morbid obesity ([place])   [place], [name], NP   4 months ago Vitamin D deficiency   [facility] McElwee, Lauren A, NP   7 months ago Type 2 diabetes mellitus with morbid obesity ([place])   [place], Henrine Screws T, NP   8 months ago COVID-32   [place], MD   11 months ago Type 2 diabetes mellitus with proteinuria ([place])   [place], [name], NP      Future Appointments            In 4 months [name], [name], NP [facility]

## 2021-06-02 ENCOUNTER — Other Ambulatory Visit: Payer: Self-pay

## 2021-06-02 ENCOUNTER — Ambulatory Visit (INDEPENDENT_AMBULATORY_CARE_PROVIDER_SITE_OTHER): Payer: 59 | Admitting: Psychiatry

## 2021-06-02 DIAGNOSIS — Z599 Problem related to housing and economic circumstances, unspecified: Secondary | ICD-10-CM

## 2021-06-02 DIAGNOSIS — N951 Menopausal and female climacteric states: Secondary | ICD-10-CM

## 2021-06-02 DIAGNOSIS — Z566 Other physical and mental strain related to work: Secondary | ICD-10-CM

## 2021-06-02 DIAGNOSIS — F411 Generalized anxiety disorder: Secondary | ICD-10-CM | POA: Diagnosis not present

## 2021-06-02 DIAGNOSIS — E119 Type 2 diabetes mellitus without complications: Secondary | ICD-10-CM | POA: Diagnosis not present

## 2021-06-02 NOTE — Progress Notes (Signed)
Psychotherapy Progress Note [facility] Marliss Czar, PhD LP  Patient ID: Stefanie Stewart)    MRN: [id number] Therapy format: Individual psychotherapy Date: 06/02/2021      Start: 10:06a     Stop: 10:56a     Time Spent: 50 min Location: In-person   Session narrative (presenting needs, interim history, self-report of stressors and symptoms, applications of prior therapy, status changes, and interventions made in session) Question how to deal with a large check that hasn't been cashed (in 6 days, by cousin).   Grateful for the callback about her coworkers.  Had a talk with Amy where she straightforwardly asked how she's doing at preventing herself portraying negativity and asked calmly what made her seem like she would be resenting.  Very successful.  Stefanie Stewart is another matter, tend to get catty with each other and conspicuously sweet.  Annoying habit of horning in on conversations.    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  Long enough to see it was notes defending himself to his paranoid wife about decisions made in the office.  Awkward.  Resolved to let it ride, let him work it through, and not let getting too much "inside baseball" make her think she has to do something.  Question about dealing with a homeless man approaching her for some food, going in a store with him, then getting him buying a cigar instead of chips as offered.  4 lbs from her weight goal.  Means a lot to get to the weight she was when father passed.  Regularly walking, may want to enter something more strenuous.  Affirmed and encouraged.  Looking at the need to get further work to better stabilize her finances.  Affirmed and encouraged.  Therapeutic modalities: Cognitive Behavioral Therapy and Solution-Oriented/Positive Psychology  Mental Status/Observations:  Appearance:   Casual     Behavior:  Appropriate  Motor:  Normal   Speech/Language:   Clear and Coherent  Affect:  Appropriate  Mood:  anxious and modestly  Thought process:  normal and some flight  Thought content:    WNL  Sensory/Perceptual disturbances:    WNL  Orientation:  Fully oriented  Attention:  Good    Concentration:  Fair  Memory:  WNL  Insight:    Good  Judgment:   Good  Impulse Control:  Fair   Risk Assessment: Danger to Self: No Self-injurious Behavior: No Danger to Others: No Physical Aggression / Violence: No Duty to Warn: No Access to Firearms a concern: No  Assessment of progress:  progressing  Diagnosis:   ICD-10-CM   1. Generalized anxiety disorder  F41.1     2. Work stress  Z56.6     3. Type 2 diabetes mellitus without complication, without long-term current use of insulin (HCC)  E11.9     4. Financial problems  Z59.9     5. 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 or ER if any life-threatening psychiatric crisis Return for session(s) already scheduled. Already scheduled visit in this office 07/07/2021.  Robley Fries, PhD Marliss Czar, PhD LP Clinical Psychologist, [facility] [address] [place], [place] [address] [phone]

## 2021-06-04 ENCOUNTER — Other Ambulatory Visit: Payer: Self-pay | Admitting: Nurse Practitioner

## 2021-06-05 ENCOUNTER — Telehealth: Payer: Self-pay | Admitting: Cardiovascular Disease

## 2021-06-05 NOTE — Telephone Encounter (Signed)
Requested medication (s) are due for refill today: Yes ? ?Requested medication (s) are on the active medication list: Yes ? ?Last refill:  05/09/20 ? ?Future visit scheduled: Yes ? ?Notes to clinic:  Unable to refill per protocol, cannot delegate. ? ? ? ? ? ?Requested Prescriptions  ?Pending Prescriptions Disp Refills  ? sertraline (ZOLOFT) 50 MG tablet [Pharmacy Med Name: Sertraline HCl 50 MG Oral Tablet] 90 tablet 0  ?  Sig: TAKE 1 & 1/2 (ONE & ONE-HALF) TABLETS BY MOUTH ONCE DAILY  ?  ? Not Delegated - Psychiatry:  Antidepressants - SSRI - sertraline Failed - 06/04/2021  9:01 AM  ?  ?  Failed - This refill cannot be delegated  ?  ?  Passed - AST in normal range and within 360 days  ?  AST  ?Date Value Ref Range Status  ?01/13/2021 13 0 - 40 IU/L Final  ?  ?  ?  ?  Passed - ALT in normal range and within 360 days  ?  ALT  ?Date Value Ref Range Status  ?01/13/2021 19 0 - 32 IU/L Final  ?  ?  ?  ?  Passed - Completed PHQ-2 or PHQ-9 in the last 360 days  ?  ?  Passed - Valid encounter within last 6 months  ?  Recent Outpatient Visits   ? ?      ? 2 months ago Type 2 diabetes mellitus with morbid obesity ([place])  ? [place], [place] T, NP  ? 4 months ago Vitamin D deficiency  ? [place], NP  ? 8 months ago Type 2 diabetes mellitus with morbid obesity ([place])  ? [facility] [place], [place] T, NP  ? 8 months ago COVID-19  ? [facility] V[place], [place], MD  ? 11 months ago Type 2 diabetes mellitus with proteinuria ([place])  ? [facility] [place], Henrine Screws T, NP  ? ?  ?  ?Future Appointments   ? ?        ? In 3 months [name], [name], NP [facility]  ? ?  ? ?  ?  ?  ? ? ? ? ?

## 2021-06-05 NOTE — Telephone Encounter (Signed)
Called [phone] to check the status of the denial letter that we had yet to receive (requested 05/10/2020) ? ?Spoke with Christy Sartorius. ?Appeal was started for [place].  ?ID # [id number] ? ?He stated that we should have a response before 07/08/2021.  ?

## 2021-06-05 NOTE — Telephone Encounter (Signed)
Patient states [employer] Is going to send her an application for Repatha and will need the letter of denial to send with application. Please call to discuss. Fax [phone] For [employer]

## 2021-06-05 NOTE — Telephone Encounter (Signed)
Last seen in December 2022, has 6 month f/up scheduled for June 2023 ?

## 2021-06-08 NOTE — Telephone Encounter (Signed)
Patient returning call for status ?

## 2021-06-09 ENCOUNTER — Telehealth: Payer: Self-pay | Admitting: Cardiovascular Disease

## 2021-06-09 NOTE — Telephone Encounter (Signed)
I spoke with pt and she mentioned that she is awaiting her patient assistance program form in the mail and that she will be filling it out and dropping it off at our office asap.  ?She mentioned that denial letter is needed to help submit her application. ?She is aware that once we receive her completed application/information we will submit it with her application. ?I have fwd message to [place] to verify denial letter. ?

## 2021-06-09 NOTE — Telephone Encounter (Signed)
She currently has 1 shot of repatha available and not due until next wk. ?

## 2021-06-09 NOTE — Telephone Encounter (Signed)
Patient is calling back for the status of her application for Repatha

## 2021-06-09 NOTE — Telephone Encounter (Signed)
Unable to see denial letter or status in Covermymeds pt not in my workque. ?PA was initiated by [place], [place]. ?I will forward to see if she has this information available. ?

## 2021-06-12 NOTE — Telephone Encounter (Signed)
Called patients insurance company Again -- [phone] ? ?Spoke with [employer].  ?She stated that the Appeal for patients Repatha is still in progress.  ?Requested to have the denial letter refaxed to [name] again for the 3rd time.  ?

## 2021-06-12 NOTE — Telephone Encounter (Signed)
I have not seen this denial letter at all. Stefanie Qua, RN is who charted the denial for this patients repatha.  ?Per phone note started 05/09/2021 I have called the insurance company three times requesting them to fax the denial letter and have yet to receive it. -- if you have any other recommendations on for to obtain it I would appreciate it because it seems we are getting no where with calling the insurance company for it.  ? ?Appeal has been stated for this patient as well earlier this month and is still in progress. Stefanie Stewart stated today that it could take up to 30 business days for an appeal.  ? ?

## 2021-06-12 NOTE — Telephone Encounter (Signed)
Denial letter was received today @ 3:03PM ?Will place in filing cabinet under "R"   ?

## 2021-06-13 NOTE — Telephone Encounter (Signed)
Incoming fax from [employer].  ?Patients appeal for Repatha has been approved.  ?Approval dates 06/05/2021-06/06/2022 ? ?Called patient to make her aware. No answer. LMOV ?

## 2021-06-13 NOTE — Telephone Encounter (Signed)
I spoke with pt and she is aware that she has been approved for Repatha.  ?She will not be able to have her application completed until Friday because she is out of town for work. ?Pt mentioned that she will reach out to [name] if she needs [name] to send in Rx to her pharmacy or is she wants to go forward with pt assistance. ?She is going to think about it and see if she is able to afford the medication if insurance covers it ?

## 2021-06-13 NOTE — Telephone Encounter (Signed)
Noted, thank you [place]! ?

## 2021-06-16 NOTE — Telephone Encounter (Signed)
Patient brought in Patient assistance application today for [name] to fill out our part and fax to company.  ? ?Looks like per reviewing the application she marked she didn't have insurance in which she does and she didn't not sign in the patient signature sections.  ? ?

## 2021-06-19 ENCOUNTER — Telehealth: Payer: Self-pay | Admitting: Emergency Medicine

## 2021-06-19 NOTE — Telephone Encounter (Signed)
Called and spoke with patient regarding her Repatha PAF application that she dropped off.  ? ?Explained to patient that she had checked the box where it said that she had no insurance but then put insurance information and hadn't signed the application.  ? ?Pt stated, "Well then just check the correct box, I made a mistake , so what" Explained to patient that legally I couldn't do that, and the application needed to be signed by her anyway. Pt stated that she had signed it. Explained to patient that I was looking at application and she had put written name on application but had not signed it.  ? ?Pt then stated "Fine, just shred it. This has been the biggest pain in my ass and I haven't gotten any help. They had to mail me the application and that took forever and I'll have to call them to get me to mail another one so just forget it and shred it."  ? ?I told patient that the application was available online and pt stated she didn't have a computer. Offered to print the application out and leave it up front for her. Pt stated that she wouldn't be back in [facility] until "who knows when" and she doesn't have the gas money to come pick it up. Offered to mail it to her. Pt again stated, "Just forget it. This is pointless and is just a big hassle and I don't want to even bother with it so just shred it."  ? ?Told patient I would certainly shred it per her request and patient hung up.   ?

## 2021-06-19 NOTE — Telephone Encounter (Signed)
She would need to apply for a copay card if she has Pharmacist, community. That can be done online at [email or web address] then click paying for repatha and follow the prompts. Patients can enroll/re-enroll by calling [phone]) or by going to [email or web address].  ?If she has medicare that means she would need to appy for a grant through the [place] by calling [phone]. I will route back to brittany slayton as she has been working with this patient and they haven't been seen by a pharmd.  ?

## 2021-06-20 NOTE — Telephone Encounter (Signed)
She doesn't have Medicare insurance, she has commercial. Just needs to get set up with copay card - details on this are below by [facility]. Does not qualify for pt assistance since she has insurance and her insurance is covering [place]. ?

## 2021-06-28 ENCOUNTER — Other Ambulatory Visit: Payer: Self-pay | Admitting: Nurse Practitioner

## 2021-06-28 DIAGNOSIS — Z1231 Encounter for screening mammogram for malignant neoplasm of breast: Secondary | ICD-10-CM

## 2021-07-07 ENCOUNTER — Ambulatory Visit (INDEPENDENT_AMBULATORY_CARE_PROVIDER_SITE_OTHER): Payer: 59 | Admitting: Psychiatry

## 2021-07-07 DIAGNOSIS — Z566 Other physical and mental strain related to work: Secondary | ICD-10-CM

## 2021-07-07 DIAGNOSIS — E119 Type 2 diabetes mellitus without complications: Secondary | ICD-10-CM | POA: Diagnosis not present

## 2021-07-07 DIAGNOSIS — F411 Generalized anxiety disorder: Secondary | ICD-10-CM

## 2021-07-07 DIAGNOSIS — Z599 Problem related to housing and economic circumstances, unspecified: Secondary | ICD-10-CM

## 2021-07-07 NOTE — Progress Notes (Signed)
Psychotherapy Progress Note [facility] [name], PhD LP  Patient ID: Stefanie Stewart)    MRN: [id number] Therapy format: Individual psychotherapy Date: 07/07/2021      Start: 10:20a     Stop: 11:10a     Time Spent: 50 min Location: In-person   Session narrative (presenting needs, interim history, self-report of stressors and symptoms, applications of prior therapy, status changes, and interventions made in session) Financially strained -- car trouble (battery), IRS bill ($4K, tied to mistakes made reporting her income and cause to refund subsidies received under [employer]).  Sold some jewelry of mother's to pay the bill.  May also be an issue with current carrier, Friday, processing claims.  Mammogram next month, tempted to abandon insurance altogether until November for noting trouble with "Obamacare".  Discussed in some depth, clarifying that she probably will not qualify for a marketplace subsidy, but she certainly can obtain [employer], she just needs to be accurate about income reporting and do the math on what her options would cost.  Reminded that going off health insurance opens her up to preexisting condition exclusions, which she did not know.    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  Increasingly worried about having to look out for him.  Discussed job-seeking, knows she will need more income to make ends meet better.  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  No new drama with female coworkers.  Still letting it ride about suspected conflict between boss and wife implicating her.  Socially, frustrated not meeting men, but admits she isn't doing what will meet  them, either.  Affirmed her discretion to put herself out there or not.  Therapeutic modalities: Cognitive Behavioral Therapy, Solution-Oriented/Positive Psychology, and Assertiveness/Communication  Mental Status/Observations:  Appearance:   Casual     Behavior:  Appropriate  Motor:  Normal  Speech/Language:   Clear and Coherent  Affect:  Appropriate  Mood:  normal and restless  Thought process:  Some flight  Thought content:    worry  Sensory/Perceptual disturbances:    WNL  Orientation:  Fully oriented  Attention:  Good    Concentration:  Fair  Memory:  WNL  Insight:    Good  Judgment:   Fair  Impulse Control:  Good   Risk Assessment: Danger to Self: No Self-injurious Behavior: No Danger to Others: No Physical Aggression / Violence: No Duty to Warn: No Access to Firearms a concern: No  Assessment of progress:  stabilized  Diagnosis:   ICD-10-CM   1. Generalized anxiety disorder  F41.1     2. Work stress  Z56.6     3. Financial problems  Z59.9     4. Type 2 diabetes mellitus without complication, without long-term current use of insulin (HCC)  E11.9      Plan:  Further assess work opportunities to better stabilize income and benefits Advise against dropping health insurance altogether Continue letting suggestions and rumor ride in office, and if accosted, either decline a fight or use perception-checking questions rather than defenses Option to start dating Continue good diabetes care with diet & exercise Other recommendations/advice as may be noted above Continue to utilize previously learned skills ad lib Maintain medication as prescribed and work faithfully with relevant prescriber(s)  if any changes are desired or seem indicated Call the clinic on-call service, 988/hotline, 911, or present to [facility] or ER if any life-threatening psychiatric crisis No follow-ups on file. Already scheduled visit in this office 07/28/2021.  Blanchie Serve, PhD [name], PhD  LP Clinical Psychologist, [facility] [address], [place], [place] [address] [phone]

## 2021-07-21 ENCOUNTER — Telehealth: Payer: Self-pay | Admitting: Nurse Practitioner

## 2021-07-21 DIAGNOSIS — Z789 Other specified health status: Secondary | ICD-10-CM

## 2021-07-21 NOTE — Telephone Encounter (Signed)
Routing to provider. Patient is needing Hep B documentation for a job. No record of Hep B vaccines in Epic or [facility]. Can we draw a titer for the patient? Patient requesting to have this done ASAP ?

## 2021-07-21 NOTE — Telephone Encounter (Signed)
Copied from [place] [id number]. Topic: General - Other ?>> Jul 21, 2021 10:49 AM Leward Quan A wrote: ?Reason for CRM: Patient called in to inform Marnee Guarneri that she is in  need of all her immunization record to be sent to her via My Chart since its kind of  hard to get to office from [place]. Please advise and inform patient when done ?

## 2021-07-24 ENCOUNTER — Other Ambulatory Visit: Payer: Self-pay

## 2021-07-24 DIAGNOSIS — Z111 Encounter for screening for respiratory tuberculosis: Secondary | ICD-10-CM

## 2021-07-24 DIAGNOSIS — Z789 Other specified health status: Secondary | ICD-10-CM

## 2021-07-24 NOTE — Telephone Encounter (Signed)
Please call and schedule lab appointment ASAP.  ?

## 2021-07-24 NOTE — Telephone Encounter (Signed)
LVM asking pt to call back to schedule lab visit ?

## 2021-07-24 NOTE — Telephone Encounter (Signed)
Pt scheduled today @ 1:40 ?

## 2021-07-25 ENCOUNTER — Encounter: Payer: Self-pay | Admitting: Nurse Practitioner

## 2021-07-25 ENCOUNTER — Telehealth: Payer: Self-pay | Admitting: Nurse Practitioner

## 2021-07-25 DIAGNOSIS — Z1231 Encounter for screening mammogram for malignant neoplasm of breast: Secondary | ICD-10-CM

## 2021-07-25 LAB — HEPATITIS B SURFACE ANTIBODY, QUANTITATIVE: Hepatitis B Surf Ab Quant: 6.8 m[IU]/mL — ABNORMAL LOW (ref >9.9)

## 2021-07-25 NOTE — Progress Notes (Signed)
Contacted via [place] -- please check to ensure Jennavieve received this message via a phone call to her: ?Good evening Dinorah, your labs have returned and you are not showing immunity to Hepatitis B.  If this is for a job, they may require you to obtain vaccinations for this, which you can obtain in office via nurse visits only.  It is a 3 shot series: one vaccine now, then one 4 weeks after first injection, and then the last one 8 weeks after second vaccination.  Any questions?  TB is still pending. ?Keep being awesome!!  Thank you for allowing me to participate in your care.  I appreciate you. ?Kindest regards, ?Harace Mccluney ? ?

## 2021-07-25 NOTE — Telephone Encounter (Signed)
Copied from [place] [id number]. Topic: Referral - Status ?>> Jul 25, 2021 11:57 AM Yvette Rack wrote: ?Reason for CRM: Pt stated she needs an order for a diagnostic mammogram to be sent to [facility] because that is the only location that she was told accepts [employer]. Pt provided contact number [phone] as the direct phone# to that office. Pt asked to be contacted if there are any questions or concerns. Cb# [phone] ?

## 2021-07-26 NOTE — Telephone Encounter (Signed)
Order faxed per patient request. Patient notified that this was done via a mychart message.  ?

## 2021-07-26 NOTE — Telephone Encounter (Signed)
[facility] and got their fax number. They also requested to have the patient's demographics and last 3 mammogram results faxed to them. Will fax order once signed by the provider.  ?

## 2021-07-28 ENCOUNTER — Ambulatory Visit: Payer: 59 | Admitting: Psychiatry

## 2021-07-28 DIAGNOSIS — F411 Generalized anxiety disorder: Secondary | ICD-10-CM | POA: Diagnosis not present

## 2021-07-28 DIAGNOSIS — Z566 Other physical and mental strain related to work: Secondary | ICD-10-CM

## 2021-07-28 DIAGNOSIS — Z599 Problem related to housing and economic circumstances, unspecified: Secondary | ICD-10-CM | POA: Diagnosis not present

## 2021-07-28 DIAGNOSIS — Z638 Other specified problems related to primary support group: Secondary | ICD-10-CM

## 2021-07-28 LAB — QUANTIFERON-TB GOLD PLUS
QuantiFERON Mitogen Value: >10 IU/mL
QuantiFERON Nil Value: 0.02 IU/mL
QuantiFERON TB1 Ag Value: 0.02 IU/mL
QuantiFERON TB2 Ag Value: 0.02 IU/mL
QuantiFERON-TB Gold Plus: NEGATIVE

## 2021-07-28 NOTE — Progress Notes (Signed)
Contacted via [place] ? ? ?TB testing is negative!!:)

## 2021-07-28 NOTE — Progress Notes (Signed)
Psychotherapy Progress Note [facility] [name], PhD LP  Patient ID: Stefanie Stewart)    MRN: [id number] Therapy format: Individual psychotherapy Date: 07/28/2021      Start: 10:19a     Stop: 11:09a     Time Spent: 50 min Location: In-person   Session narrative (presenting needs, interim history, self-report of stressors and symptoms, applications of prior therapy, status changes, and interventions made in session) Starting to get put off by her primary care practice, getting advice that doesn't make sense to her, like to have the full slate of Hep B shots due to a low titer.  Following other advice to approach the health dept. For lower cost.  Also finding communication errors by her NP.  And it's increasingly a burdensome drive to [place] for service.  Discussed options.  Financially, making sure to not incur tax penalties and prevent setting up false qualification for subsidy, even though it hurts.  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  Is in progress looking for side job to help balance her budget.  Affirmed and encouraged.  Some apprehension about the family beach trip coming up.  Expecting to feel pressured to commit time to unwanted activities and travel, and unsure about whether her car is sound enough to make the trip.  Affirmed her ability to negotiate time and enjoy both subgroup and individual time at her discretion.  Therapeutic modalities: Cognitive Behavioral Therapy, Solution-Oriented/Positive Psychology, Ego-Supportive, and Assertiveness/Communication  Mental Status/Observations:  Appearance:   Casual     Behavior:  Appropriate  Motor:  Normal  Speech/Language:   Clear and Coherent  Affect:  Appropriate  Mood:  Some anxiety  Thought process:  normal and some flight  Thought content:    WNL  Sensory/Perceptual disturbances:    WNL   Orientation:  Fully oriented  Attention:  Good    Concentration:  Fair  Memory:  WNL  Insight:    Good  Judgment:   Good  Impulse Control:  Good   Risk Assessment: Danger to Self: No Self-injurious Behavior: No Danger to Others: No Physical Aggression / Violence: No Duty to Warn: No Access to Firearms a concern: No  Assessment of progress:  progressing  Diagnosis:   ICD-10-CM   1. Generalized anxiety disorder  F41.1     2. Work stress  Z56.6     3. Financial problems  Z59.9     4. Relationship problem with family member  Z63.8      Plan:  Rework or replace primary care relationship at discretion  Further assess work opportunities to better stabilize income and benefits Continue letting suggestions and rumor ride in office, and if accosted, either decline a fight or use perception-checking questions rather than defenses Continue good diabetes care with diet & exercise Communication tips with family if accosted about having changed, or not participating Other recommendations/advice as may be noted above Continue to utilize previously learned skills ad lib Maintain medication as prescribed and work faithfully with relevant prescriber(s) if any changes are desired or seem indicated Call the clinic on-call service, 988/hotline, 911, or present to [facility] or ER if any life-threatening psychiatric crisis Return for session(s) already scheduled. Already scheduled visit in this office 09/08/2021.  Blanchie Serve, PhD [name], PhD LP Clinical Psychologist, [facility] [address],  [place], [place] [address] ([phone]

## 2021-08-02 ENCOUNTER — Telehealth: Payer: Self-pay | Admitting: Nurse Practitioner

## 2021-08-02 DIAGNOSIS — Z1231 Encounter for screening mammogram for malignant neoplasm of breast: Secondary | ICD-10-CM

## 2021-08-02 NOTE — Telephone Encounter (Signed)
Copied from [place]. Topic: General - Other ?>> Aug 02, 2021  3:04 PM P[place], Brayton Layman A wrote: ?Caller from [place] stated she needs orders re-sent. Caller needs orders for a diagnostic mammogram and ultrasound.  ? ?Please fax orders to [phone]. ?

## 2021-08-02 NOTE — Telephone Encounter (Signed)
Left message for patient to give our office a call back to gather clarification as to where she would like her diagnostic mammogram order to placed at [facility]. Advised patient to give our office a call back with clarification.  ? ?OK for Nurse Triage/PEC to gather more information if patient calls back.  ?

## 2021-08-02 NOTE — Telephone Encounter (Signed)
[facility] called to say the pt wants her order to be a diagnostic mammogram. ?She said pt has a lump under her arm near her breast. ?I advised this person pt needs an appointment if she is having an issue for a diagnostic. ?She said she will relay the message, but the pt will not be happy about that.   ? ?

## 2021-08-03 NOTE — Telephone Encounter (Signed)
Orders pended below. Routing to provider to sign off on them so we can print and fax as requested.  ?

## 2021-08-04 NOTE — Telephone Encounter (Signed)
Imaging orders printed and faxed to [facility].  ?

## 2021-08-17 ENCOUNTER — Ambulatory Visit (INDEPENDENT_AMBULATORY_CARE_PROVIDER_SITE_OTHER): Payer: 59

## 2021-08-17 DIAGNOSIS — Z1231 Encounter for screening mammogram for malignant neoplasm of breast: Secondary | ICD-10-CM

## 2021-08-18 NOTE — Progress Notes (Signed)
Contacted via MyChart   Normal mammogram, may repeat in one year:)

## 2021-09-08 ENCOUNTER — Ambulatory Visit: Payer: 59 | Admitting: Psychiatry

## 2021-09-17 NOTE — Patient Instructions (Signed)

## 2021-09-20 ENCOUNTER — Ambulatory Visit (INDEPENDENT_AMBULATORY_CARE_PROVIDER_SITE_OTHER): Payer: 59 | Admitting: Psychiatry

## 2021-09-20 DIAGNOSIS — Z599 Problem related to housing and economic circumstances, unspecified: Secondary | ICD-10-CM | POA: Diagnosis not present

## 2021-09-20 DIAGNOSIS — Z638 Other specified problems related to primary support group: Secondary | ICD-10-CM | POA: Diagnosis not present

## 2021-09-20 DIAGNOSIS — Z566 Other physical and mental strain related to work: Secondary | ICD-10-CM | POA: Diagnosis not present

## 2021-09-20 DIAGNOSIS — F411 Generalized anxiety disorder: Secondary | ICD-10-CM | POA: Diagnosis not present

## 2021-09-20 DIAGNOSIS — E119 Type 2 diabetes mellitus without complications: Secondary | ICD-10-CM

## 2021-09-20 NOTE — Progress Notes (Signed)
Psychotherapy Progress Note [facility] [name], PhD LP  Patient ID: Stefanie Stewart)    MRN: [id number] Therapy format: Individual psychotherapy Date: 09/20/2021      Start: 5:02p     Stop: 5:52p     Time Spent: 50 min Location: In-person   Session narrative (presenting needs, interim history, self-report of stressors and symptoms, applications of prior therapy, status changes, and interventions made in session) TC last Fri asking guidance with situation with longstanding employer/workplace.  Landed more secure job, gave notice to Dr. Lovena Le, who was devastated and asked if something was bad wrong.  Basically able to tell him she needed to find better financial security and benefits, which was true, but he tried to make a quick, unrealistic promise to put her on health insurance, asking if she would stay if he did.  Squeamish feeling letting him down, but assured it's absolutely OK for her to look out for her own security and make the deal for work that makes the most sense for her own interests.  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  Support/empathy provided.   Today, says Dr. Tanna Furry tone has changed, seems to have sobered more that she is resolved to move on and it's reasonable to do what she needs for herself.  Tomorrow is her last day.  Irritated by colleague Earnest Bailey trying to pump her for information, able to make relatively short work of it.  Talked out of trying to pre-explain to everyone tomorrow what her reasons for going are, and just save it for if she's asked.  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 coworker's jealousy for her friend to get the job.  Worried about a 105 b.s., and some headache, but reassured that's not a  significant elevation for an active diabetic as I've heard it from other patients repeating their physicians' advice.  Probably overinterpreting a nutritionist's advice.    Now thinking she will go off health insurance for 90 days rather than pay for COBRA.  Cautioned to find out for sure what the rules are now about preexisting conditions, as it is my understanding that a gap in coverage opens her to 1 year of pre-existing exclusions, and that could backfire on her in covering c/o anxiety, diabetes, etc.  Therapeutic modalities: Cognitive Behavioral Therapy, Solution-Oriented/Positive Psychology, and Assertiveness/Communication  Mental Status/Observations:  Appearance:   Casual     Behavior:  Appropriate  Motor:  Normal  Speech/Language:   Clear and Coherent  Affect:  Appropriate  Mood:  Some anxiety  Thought process:  normal  Thought content:    worry  Sensory/Perceptual disturbances:    WNL  Orientation:  Fully oriented  Attention:  Good    Concentration:  Fair  Memory:  WNL  Insight:    Good  Judgment:   Fair  Impulse Control:  Good   Risk Assessment: Danger to Self: No Self-injurious Behavior: No Danger to Others: No Physical Aggression / Violence: No Duty to Warn: No Access to Firearms a concern: No  Assessment of progress:  progressing  Diagnosis:   ICD-10-CM   1. Generalized anxiety disorder  F41.1     2. Work stress  Z56.6     3. Financial problems  Z59.9     4. 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  If baited and need to respond, either decline a fight or use perception-checking  questions rather than defenses. Similar communication tips with family if accosted about having changed, or not participating Continue good diabetes care with diet & exercise.  If worries, ask relevant providers, otherwise self-assure a single borderline reading is not a sign of trouble Other recommendations/advice as may be noted above Continue to utilize previously learned skills ad lib Maintain medication as prescribed and work faithfully with relevant prescriber(s) if any changes are desired or seem indicated Call the clinic on-call service, 988/hotline, 911, or present to [facility] or ER if any life-threatening psychiatric crisis Return for session(s) already scheduled. Already scheduled visit in this office 09/29/2021.  Blanchie Serve, PhD [name], PhD LP Clinical Psychologist, [facility] [address] [place], [place] [address] [phone]

## 2021-09-22 ENCOUNTER — Encounter: Payer: Self-pay | Admitting: Nurse Practitioner

## 2021-09-22 ENCOUNTER — Ambulatory Visit (INDEPENDENT_AMBULATORY_CARE_PROVIDER_SITE_OTHER): Payer: 59 | Admitting: Nurse Practitioner

## 2021-09-22 VITALS — BP 115/72 | HR 60 | Temp 98.0°F | Ht 68.0 in | Wt 190.2 lb

## 2021-09-22 DIAGNOSIS — F320 Major depressive disorder, single episode, mild: Secondary | ICD-10-CM

## 2021-09-22 DIAGNOSIS — E1129 Type 2 diabetes mellitus with other diabetic kidney complication: Secondary | ICD-10-CM | POA: Diagnosis not present

## 2021-09-22 DIAGNOSIS — Z6828 Body mass index (BMI) 28.0-28.9, adult: Secondary | ICD-10-CM

## 2021-09-22 DIAGNOSIS — E1159 Type 2 diabetes mellitus with other circulatory complications: Secondary | ICD-10-CM

## 2021-09-22 DIAGNOSIS — E1169 Type 2 diabetes mellitus with other specified complication: Secondary | ICD-10-CM | POA: Diagnosis not present

## 2021-09-22 DIAGNOSIS — E785 Hyperlipidemia, unspecified: Secondary | ICD-10-CM

## 2021-09-22 DIAGNOSIS — I152 Hypertension secondary to endocrine disorders: Secondary | ICD-10-CM

## 2021-09-22 DIAGNOSIS — R809 Proteinuria, unspecified: Secondary | ICD-10-CM

## 2021-09-22 LAB — MICROALBUMIN, URINE WAIVED
Creatinine, Urine Waived: 300 mg/dL (ref 10–300)
Microalb, Ur Waived: 10 mg/L (ref 0–19)
Microalb/Creat Ratio: <30 mg/g (ref <30)

## 2021-09-22 LAB — BAYER DCA HB A1C WAIVED: HB A1C (BAYER DCA - WAIVED): 5.2 % (ref 4.8–5.6)

## 2021-09-22 NOTE — Assessment & Plan Note (Signed)
Chronic, ongoing.  Continue current medication regimen and adjust as needed.  Denies SI/HI. Return to office in 6 months.

## 2021-09-22 NOTE — Assessment & Plan Note (Signed)
Chronic, stable with BP at goal.  Continue Losartan 25 MG daily for kidney protection and then adjust in future as needed -- may consider discontinuation in future.  Discussed benefit of ARB for kidney protection with proteinuria.  Recommend she monitor BP at least a few mornings a week at home and document.  DASH diet at home.  Labs: CMP, urine ALB. Return in 6 months.

## 2021-09-22 NOTE — Assessment & Plan Note (Signed)
BMI 28.92, praised for ongoing weight loss.  Recommended eating smaller high protein, low fat meals more frequently and exercising 30 mins a day 5 times a week with a goal of 10-15lb weight loss in the next 3 months. Patient voiced their understanding and motivation to adhere to these recommendations.

## 2021-09-22 NOTE — Assessment & Plan Note (Signed)
Chronic, ongoing.  History of poor tolerance statin. We will stop Repatha at this time and see how levels do without this since she has lost >60 pounds, if elevation in LDL >190 may need to restart.  Lipid panel today.  Return in 6 months.

## 2021-09-22 NOTE — Assessment & Plan Note (Signed)
Diagnosed December 2021, last  A1c much improved at 5.5%, will recheck today.  She has lost total of >60 pounds, praised for this and maintaining.  Urine ALB 80 last visit, recheck today.  Continue Metformin at 500 MG daily -- will further reduce to discontinue if ongoing improvement.  Continue Losartan 25 MG daily for kidney protection. Educated on blood sugar goals, less then 130 fasting in morning and <180 two hours after a meal.  Return in 6 months.

## 2021-09-22 NOTE — Progress Notes (Signed)
BP 115/72   Pulse 60   Temp 98 F (36.7 C) (Oral)   Ht '5\' 8"'$  (1.727 m)   Wt 190 lb 3.2 oz (86.3 kg)   SpO2 98%   BMI 28.92 kg/m    Subjective:    Patient ID: Stefanie Stewart, female    DOB: [birth year], 46 y.o.   MRN: [id number]  HPI: Stefanie Stewart is a 46 y.o. female  Chief Complaint  Patient presents with   Diabetes   Hyperlipidemia   Hypertension   Medication Management    Patient says she would like to discuss the Repatha prescription and possibly stopping the medication as she is doing well and wanting to come off the medication. Patient says she has been having issues with getting the medication.    DIABETES Last A1c in December 2022 was 5.5%.  Diagnosed in December 2021 when A1c was 11.3%, started on Metformin 1000 MG BID -- currently is taking 500 MG daily.  She continues to work-out and work on diet changes.  She would like referral to podiatry due to two calluses on right foot and unable to remove them at home. Hypoglycemic episodes:no Polydipsia/polyuria: no Visual disturbance: no Chest pain: no Paresthesias: no Glucose Monitoring: yes  Accucheck frequency: Daily  Fasting glucose:  this morning 108 -- likes in 80-90 range  Post prandial:  Evening:  Before meals: Taking Insulin?: no  Long acting insulin:  Short acting insulin: Blood Pressure Monitoring: not checking Retinal Examination: Up to Date Foot Exam: Up to Date Pneumovax: Up To Date Influenza: Up To Date Aspirin: no   HYPERTENSION / HYPERLIPIDEMIA Taking Losartan 25 MG for BP and kidney protection.  Taking Repatha for HLD -- as did not tolerate statins, even on 1 or 3 day a week schedule.  Repatha has shown to be very beneficial at lowering levels, however she is requesting to come off of this as feels she is doing well and it is difficult to get medication.  Continues to vape, but has cut back.  She has lost > 60 pounds. Satisfied with current treatment? yes Duration of hypertension:  chronic BP monitoring frequency: not checking BP range:  BP medication side effects: no Duration of hyperlipidemia: chronic Cholesterol medication side effects: no Cholesterol supplements: none Medication compliance: good compliance Aspirin: no Recent stressors: no Recurrent headaches: no Visual changes: no Palpitations: no Dyspnea: no Chest pain: no Lower extremity edema: no Dizzy/lightheaded: no  The ASCVD Risk score (Arnett DK, et al., 2019) failed to calculate for the following reasons:   The valid total cholesterol range is 130 to 320 mg/dL   DEPRESSION Continues on Sertraline 75 MG daily and Wellbutrin XL 150 MG daily.  She is starting a new job in a [employer], is nervous about this. Mood status: stable Satisfied with current treatment?: yes Symptom severity: mild  Duration of current treatment : chronic Side effects: no Medication compliance: good compliance Depressed mood: no Anxious mood: no Anhedonia: no Significant weight loss or gain: no Insomnia: none Fatigue: no Feelings of worthlessness or guilt: no Impaired concentration/indecisiveness: no Suicidal ideations: no Hopelessness: no Crying spells: no    09/22/2021    8:12 AM 03/24/2021    1:07 PM 10/07/2020    9:37 AM 05/09/2020   10:56 AM 04/04/2020    3:55 PM  Depression screen PHQ 2/9  Decreased Interest 1 0 0 0 0  Down, Depressed, Hopeless 0 0 0 0 0  PHQ - 2 Score 1 0 0  0 0  Altered sleeping 2 0 0 0   Tired, decreased energy 0 0 0 0   Change in appetite 0 0 0 0   Feeling bad or failure about yourself  0 0 0 0   Trouble concentrating 0 0 0 0   Moving slowly or fidgety/restless 0 0 0 0   Suicidal thoughts 0 0 0 0   PHQ-9 Score 3 0 0 0   Difficult doing work/chores Not difficult at all Not difficult at all Not difficult at all         09/22/2021    8:13 AM 07/31/2019   10:16 AM 04/06/2019    9:43 AM 10/24/2018    9:40 AM  GAD 7 : Generalized Anxiety Score  Nervous, Anxious, on Edge '3 1 3 '$ 0   Control/stop worrying 3 0 1 1  Worry too much - different things 3 0 2 1  Trouble relaxing 3 0 3 2  Restless 0 0 0 0  Easily annoyed or irritable '1 3 3 3  '$ Afraid - awful might happen 1 0 1 2  Total GAD 7 Score '14 4 13 9  '$ Anxiety Difficulty Somewhat difficult Somewhat difficult  Somewhat difficult   Relevant past medical, surgical, family and social history reviewed and updated as indicated. Interim medical history since our last visit reviewed. Allergies and medications reviewed and updated.  Review of Systems  Constitutional:  Negative for activity change, appetite change, diaphoresis, fatigue and fever.  Respiratory:  Negative for cough, chest tightness and shortness of breath.   Cardiovascular:  Negative for chest pain, palpitations and leg swelling.  Gastrointestinal: Negative.   Endocrine: Negative for polydipsia, polyphagia and polyuria.  Neurological: Negative.   Psychiatric/Behavioral: Negative.      Per HPI unless specifically indicated above     Objective:    BP 115/72   Pulse 60   Temp 98 F (36.7 C) (Oral)   Ht '5\' 8"'$  (1.727 m)   Wt 190 lb 3.2 oz (86.3 kg)   SpO2 98%   BMI 28.92 kg/m   Wt Readings from Last 3 Encounters:  09/22/21 190 lb 3.2 oz (86.3 kg)  03/24/21 206 lb 3.2 oz (93.5 kg)  01/13/21 210 lb (95.3 kg)    Physical Exam Vitals and nursing note reviewed.  Constitutional:      General: She is awake. She is not in acute distress.    Appearance: She is well-developed and well-groomed. She is not ill-appearing or toxic-appearing.  HENT:     Head: Normocephalic.     Right Ear: Hearing, tympanic membrane, ear canal and external ear normal.     Left Ear: Hearing, tympanic membrane, ear canal and external ear normal.  Eyes:     General: Lids are normal.        Right eye: No discharge.        Left eye: No discharge.     Conjunctiva/sclera: Conjunctivae normal.     Pupils: Pupils are equal, round, and reactive to light.  Neck:     Thyroid: No  thyromegaly.     Vascular: No carotid bruit.  Cardiovascular:     Rate and Rhythm: Normal rate and regular rhythm.     Heart sounds: Normal heart sounds. No murmur heard.    No gallop.  Pulmonary:     Effort: Pulmonary effort is normal. No accessory muscle usage or respiratory distress.     Breath sounds: Normal breath sounds.  Abdominal:     General:  Bowel sounds are normal.     Palpations: Abdomen is soft.  Musculoskeletal:     Right shoulder: Normal.     Left shoulder: No swelling, effusion, laceration, tenderness, bony tenderness or crepitus. Normal range of motion. Normal strength.     Cervical back: Normal range of motion and neck supple.     Right lower leg: No edema.     Left lower leg: No edema.  Lymphadenopathy:     Cervical: No cervical adenopathy.  Skin:    General: Skin is warm and dry.  Neurological:     Mental Status: She is alert and oriented to person, place, and time.  Psychiatric:        Attention and Perception: Attention normal.        Mood and Affect: Mood normal.        Speech: Speech normal.        Behavior: Behavior normal. Behavior is cooperative.        Thought Content: Thought content normal.    Results for orders placed or performed in visit on 07/24/21  Hepatitis B surface antibody,quantitative  Result Value Ref Range   Hepatitis B Surf Ab Quant 6.8 (L) Immunity>9.9 mIU/mL  QuantiFERON-TB Gold Plus  Result Value Ref Range   QuantiFERON Incubation Incubation performed.    QuantiFERON Criteria Comment    QuantiFERON TB1 Ag Value 0.02 IU/mL   QuantiFERON TB2 Ag Value 0.02 IU/mL   QuantiFERON Nil Value 0.02 IU/mL   QuantiFERON Mitogen Value >10.00 IU/mL   QuantiFERON-TB Gold Plus Negative Negative      Assessment & Plan:   Problem List Items Addressed This Visit       Cardiovascular and Mediastinum   Hypertension associated with diabetes ([place]) (Chronic)    Chronic, stable with BP at goal.  Continue Losartan 25 MG daily for kidney  protection and then adjust in future as needed -- may consider discontinuation in future.  Discussed benefit of ARB for kidney protection with proteinuria.  Recommend she monitor BP at least a few mornings a week at home and document.  DASH diet at home.  Labs: CMP, urine ALB. Return in 6 months.       Relevant Orders   Ambulatory referral to Podiatry   [facility] [place] Hb A1c Waived   Comprehensive metabolic panel   Microalbumin, Urine Waived     Endocrine   Hyperlipidemia associated with type 2 diabetes mellitus (HCC) (Chronic)    Chronic, ongoing.  History of poor tolerance statin. We will stop Repatha at this time and see how levels do without this since she has lost >60 pounds, if elevation in LDL >190 may need to restart.  Lipid panel today.  Return in 6 months.        Relevant Orders   Ambulatory referral to Podiatry   [facility] [place] Hb A1c Waived   Lipid Panel w/o Chol/HDL Ratio   Type 2 diabetes mellitus with proteinuria (HCC) - Primary (Chronic)    Diagnosed December 2021, last  A1c much improved at 5.5%, will recheck today.  She has lost total of >60 pounds, praised for this and maintaining.  Urine ALB 80 last visit, recheck today.  Continue Metformin at 500 MG daily -- will further reduce to discontinue if ongoing improvement.  Continue Losartan 25 MG daily for kidney protection. Educated on blood sugar goals, less then 130 fasting in morning and <180 two hours after a meal.  Return in 6 months.      Relevant Orders  Ambulatory referral to Podiatry   [facility] [place] Hb A1c Waived   Comprehensive metabolic panel   Microalbumin, Urine Waived     Other   Depression (Chronic)    Chronic, ongoing.  Continue current medication regimen and adjust as needed.  Denies SI/HI. Return to office in 6 months.      BMI 28.0-28.9,adult    BMI 28.92, praised for ongoing weight loss.  Recommended eating smaller high protein, low fat meals more frequently and exercising 30 mins a day 5 times a week with a  goal of 10-15lb weight loss in the next 3 months. Patient voiced their understanding and motivation to adhere to these recommendations.         Follow up plan: Return in about 6 months (around 03/24/2022) for T2DM, HTN/HLD, MOOD.

## 2021-09-23 LAB — COMPREHENSIVE METABOLIC PANEL
ALT: 17 IU/L (ref 0–32)
AST: 13 IU/L (ref 0–40)
Albumin/Globulin Ratio: 1.7 (ref 1.2–2.2)
Albumin: 4.6 g/dL (ref 3.8–4.8)
Alkaline Phosphatase: 39 IU/L — ABNORMAL LOW (ref 44–121)
BUN/Creatinine Ratio: 18 (ref 9–23)
BUN: 15 mg/dL (ref 6–24)
Bilirubin Total: 1.1 mg/dL (ref 0.0–1.2)
CO2: 23 mmol/L (ref 20–29)
Calcium: 9.3 mg/dL (ref 8.7–10.2)
Chloride: 106 mmol/L (ref 96–106)
Creatinine, Ser: 0.82 mg/dL (ref 0.57–1.00)
Globulin, Total: 2.7 g/dL (ref 1.5–4.5)
Glucose: 95 mg/dL (ref 70–99)
Potassium: 4.6 mmol/L (ref 3.5–5.2)
Sodium: 141 mmol/L (ref 134–144)
Total Protein: 7.3 g/dL (ref 6.0–8.5)
eGFR: 89 mL/min/{1.73_m2} (ref >59)

## 2021-09-23 LAB — LIPID PANEL W/O CHOL/HDL RATIO
Cholesterol, Total: 131 mg/dL (ref 100–199)
HDL: 29 mg/dL — ABNORMAL LOW (ref >39)
LDL Chol Calc (NIH): 85 mg/dL (ref 0–99)
Triglycerides: 87 mg/dL (ref 0–149)
VLDL Cholesterol Cal: 17 mg/dL (ref 5–40)

## 2021-09-24 NOTE — Progress Notes (Signed)
Contacted via [place] morning [name], your labs have returned.  Cholesterol levels continue to look fantastic with LDL at 85 and triglycerides at 87 + total cholesterol 131.  We can see how you do without Repatha, but I do recommend if these trend up we restart to keep risk of stroke or heart attack low.  Kidney function, creatinine and eGFR, remains normal, as is liver function, AST and ALT.  Any questions? Keep being amazing!!  Thank you for allowing me to participate in your care.  I appreciate you. Kindest regards, Kaedance Magos

## 2021-09-25 ENCOUNTER — Other Ambulatory Visit: Payer: Self-pay | Admitting: Nurse Practitioner

## 2021-09-27 NOTE — Telephone Encounter (Signed)
Requested Prescriptions  Pending Prescriptions Disp Refills  . losartan (COZAAR) 25 MG tablet [Pharmacy Med Name: Losartan Potassium 25 MG Oral Tablet] 90 tablet 1    Sig: Take 1 tablet by mouth once daily     Cardiovascular:  Angiotensin Receptor Blockers Passed - 09/25/2021  8:33 AM      Passed - Cr in normal range and within 180 days    Creat  Date Value Ref Range Status  05/14/2014 0.70 0.50 - 1.10 mg/dL Final   Creatinine, Ser  Date Value Ref Range Status  09/22/2021 0.82 0.57 - 1.00 mg/dL Final         Passed - K in normal range and within 180 days    Potassium  Date Value Ref Range Status  09/22/2021 4.6 3.5 - 5.2 mmol/L Final         Passed - Patient is not pregnant      Passed - Last BP in normal range    BP Readings from Last 1 Encounters:  09/22/21 115/72         Passed - Valid encounter within last 6 months    Recent Outpatient Visits          5 days ago Type 2 diabetes mellitus with proteinuria ([place])   M[place], Jolene T, NP   6 months ago Type 2 diabetes mellitus with morbid obesity ([place])   F[place], [name], NP   8 months ago Vitamin D deficiency   [facility] McElwee, Lauren A, NP   11 months ago Type 2 diabetes mellitus with morbid obesity ([place])   [place], Henrine Screws T, NP   1 year ago COVID-72   [place], Avanti, MD      Future Appointments            In 6 months [name], [name], NP [employer], [facility]

## 2021-09-28 ENCOUNTER — Encounter: Payer: Self-pay | Admitting: Plastic Surgery

## 2021-09-28 ENCOUNTER — Ambulatory Visit (INDEPENDENT_AMBULATORY_CARE_PROVIDER_SITE_OTHER): Payer: Self-pay | Admitting: Plastic Surgery

## 2021-09-28 VITALS — BP 107/68 | HR 66 | Ht 68.0 in | Wt 194.2 lb

## 2021-09-28 DIAGNOSIS — Z411 Encounter for cosmetic surgery: Secondary | ICD-10-CM

## 2021-09-28 NOTE — Progress Notes (Signed)
Referring Provider [name], NP [address] [place],  [place] [address]   CC:  Chief Complaint  Patient presents with   Consult      Stefanie Stewart is an 46 y.o. female.  HPI: Patient presents to discuss brachioplasty.  She has lost significant amount of weight through diet and exercise and is interested in contouring of her arms.  She is bothered by the excess skin.  She does feel like she has a little bit more weight to lose.  She is considering surgery for December.  Allergies  Allergen Reactions   Bactrim [Sulfamethoxazole-Trimethoprim] Itching    Itching (skin and throat). Denies SOB or throat closing.   Banana Other (See Comments)    Reports lip swelling. Every now and then pt states she has nausea and vomiting.    Outpatient Encounter Medications as of 09/28/2021  Medication Sig   aspirin-acetaminophen-caffeine (EXCEDRIN MIGRAINE) 250-250-65 MG tablet Take by mouth every 6 (six) hours as needed for headache.   blood glucose meter kit and supplies KIT Dispense based on patient and insurance preference. Use up to four times daily as directed. (FOR ICD-9 250.00, 250.01).   buPROPion (WELLBUTRIN XL) 150 MG 24 hr tablet Take 1 tablet by mouth once daily   cholecalciferol (VITAMIN D3) 25 MCG (1000 UNIT) tablet Take 1,000 Units by mouth daily.   Cinnamon 500 MG capsule Take 500 mg by mouth 2 (two) times daily.   fexofenadine (ALLEGRA ALLERGY) 180 MG tablet Take 1 tablet (180 mg total) by mouth daily.   ibuprofen (ADVIL) 600 MG tablet Take 1 tablet (600 mg total) by mouth every 6 (six) hours as needed.   losartan (COZAAR) 25 MG tablet Take 1 tablet by mouth once daily   metFORMIN (GLUCOPHAGE) 500 MG tablet Take 1 tablet (500 mg total) by mouth daily with breakfast.   methocarbamol (ROBAXIN) 500 MG tablet TAKE 1 TABLET BY MOUTH EVERY 8 HOURS AS NEEDED FOR MUSCLE SPASM   norethindrone (AYGESTIN) 5 MG tablet Take 1 tablet by mouth once daily   Omega-3 1000 MG CAPS Take 2  capsules by mouth daily.   sertraline (ZOLOFT) 50 MG tablet TAKE 1 & 1/2 (ONE & ONE-HALF) TABLETS BY MOUTH ONCE DAILY   TURMERIC PO Take 1 tablet by mouth daily.   No facility-administered encounter medications on file as of 09/28/2021.     Past Medical History:  Diagnosis Date   Anxiety    Cervical syndrome    COPD (chronic obstructive pulmonary disease) ([place])    Depression    Edema 2013   legs   Endometriosis    Hyperlipidemia    Hypertension    IBS (irritable bowel syndrome)    Lump or mass in breast    left    Obesity    Plantar fibromatosis    Tobacco abuse    Type 2 diabetes mellitus with morbid obesity ([place]) 03/24/2020    Past Surgical History:  Procedure Laterality Date   BREAST BIOPSY Left 02/03/2016   apocrine metaplasia    CERVICAL BIOPSY  W/ LOOP ELECTRODE EXCISION     CESAREAN SECTION  1996   COLPOSCOPY  2007   LEEP  2007    Family History  Adopted: Yes  Problem Relation Age of Onset   COPD Mother    Breast cancer Neg Hx     Social History   Social History Narrative   Not on file     Review of Systems General: Denies fevers,  chills, weight loss CV: Denies chest pain, shortness of breath, palpitations  Physical Exam    09/28/2021   11:10 AM 09/22/2021    8:05 AM 03/24/2021    9:49 AM  Vitals with BMI  Height $Remov'5\' 8"'qgqVnT$  $Remove'5\' 8"'fkSBpJr$  $RemoveB'5\' 8"'XvJJjXZl$   Weight 194 lbs 3 oz 190 lbs 3 oz 206 lbs 3 oz  BMI 29.54 79.81 02.54  Systolic 862 824 175  Diastolic 68 72 78  Pulse 66 60 80    General:  No acute distress,  Alert and oriented, Non-Toxic, Normal speech and affect Examination of her arms shows moderate skin laxity with mild excess adipose tissue.  No scars.  Both hands neurovascularly intact.  There is a bit more excess skin on the left side than the right side.  Assessment/Plan Patient is a good candidate for standard Lipo brachioplasty.  We did briefly discussed the short scar technique but she seems more accepting of the traditional scar in favor of a better  contour which I do think would be the case for her.  We reviewed risks of the procedure that include bleeding, infection, damage to surrounding structures need for additional procedures.  We discussed postoperative care and expectations.  We will plan to provide a quote for her.  All of her questions were answered.  Cindra Presume 09/28/2021, 1:13 PM

## 2021-09-29 ENCOUNTER — Ambulatory Visit (INDEPENDENT_AMBULATORY_CARE_PROVIDER_SITE_OTHER): Payer: 59 | Admitting: Psychiatry

## 2021-09-29 DIAGNOSIS — Z599 Problem related to housing and economic circumstances, unspecified: Secondary | ICD-10-CM | POA: Diagnosis not present

## 2021-09-29 DIAGNOSIS — F411 Generalized anxiety disorder: Secondary | ICD-10-CM

## 2021-09-29 DIAGNOSIS — Z566 Other physical and mental strain related to work: Secondary | ICD-10-CM

## 2021-09-29 DIAGNOSIS — E119 Type 2 diabetes mellitus without complications: Secondary | ICD-10-CM

## 2021-09-29 NOTE — Progress Notes (Unsigned)
Psychotherapy Progress Note [facility] [name], PhD LP  Patient ID: Stefanie Stewart)    MRN: [id number] Therapy format: Individual psychotherapy Date: 09/29/2021      Start: 9:19a     Stop: 10:09a     Time Spent: 50 min Location: In-person   Session narrative (presenting needs, interim history, self-report of stressors and symptoms, applications of prior therapy, status changes, and interventions made in session) Checked out insurance question, believes she was assured she would be covered with no PEC if she lets gap occur in coverage, and wants to go ahead and not have health insurance, ditch the marketplace altogether and trust it'll be OK.  Recommended still get temp insurance.  New job starts Monday.  Had a lot of quiet/alone time this week, which means a lot of worrying how it will be at the new practice, with new tactics, schedule, and 11 other women and whether they will be catty.  A lot of unknowns about what she'll face in terms of the need to master unfamiliar computer skills and whether she needs to seem fully competent right away.  Assured that it will get better, the veteran asst [name]) will teach and be available, and every question she can ask is a good question, including when it's OK to ask questions.  Otherwise, it's all about showing up and getting questions answered, then she'll feel better.  Tired today, notes some spoiled sleep.  Woke up thinking her doorbell was rung, but able to reason that if it was real, it would happen again, and it didn't.  Assured only a temporary sleep phenomenon.    Therapeutic modalities: {AM:23362::"Cognitive Behavioral Therapy","Solution-Oriented/Positive Psychology"}  Mental Status/Observations:  Appearance:   {PSY:22683}     Behavior:  {PSY:21022743}  Motor:  {PSY:22302}  Speech/Language:   {PSY:22685}  Affect:  {PSY:22687}  Mood:  {PSY:31886}  Thought process:  {PSY:31888}  Thought content:     {PSY:9845823208}  Sensory/Perceptual disturbances:    {[phone]}  Orientation:  {Psych Orientation:23301::"Fully oriented"}  Attention:  {Good-Fair-Poor ratings:23770::"Good"}    Concentration:  {Good-Fair-Poor ratings:23770::"Good"}  Memory:  {[phone]}  Insight:    {Good-Fair-Poor ratings:23770::"Good"}  Judgment:   {Good-Fair-Poor ratings:23770::"Good"}  Impulse Control:  {Good-Fair-Poor ratings:23770::"Good"}   Risk Assessment: Danger to Self: {Risk:22599::"No"} Self-injurious Behavior: {Risk:22599::"No"} Danger to Others: {Risk:22599::"No"} Physical Aggression / Violence: {Risk:22599::"No"} Duty to Warn: {AMYesNo:22526::"No"} Access to Firearms a concern: {AMYesNo:22526::"No"}  Assessment of progress:  {Progress:22147::"progressing"}  Diagnosis: No diagnosis found. Plan:  *** Other recommendations/advice as may be noted above Continue to utilize previously learned skills ad lib Maintain medication as prescribed and work faithfully with relevant prescriber(s) if any changes are desired or seem indicated Call the clinic on-call service, 988/hotline, 911, or present to [facility] or ER if any life-threatening psychiatric crisis Return for session(s) already scheduled. Already scheduled visit in this office 11/01/2021.  Blanchie Serve, PhD [name], PhD LP Clinical Psychologist, [facility] [address] [place], [place] [address] [phone]

## 2021-10-14 ENCOUNTER — Ambulatory Visit (INDEPENDENT_AMBULATORY_CARE_PROVIDER_SITE_OTHER): Payer: Self-pay | Admitting: Plastic Surgery

## 2021-10-14 ENCOUNTER — Encounter: Payer: Self-pay | Admitting: Plastic Surgery

## 2021-10-14 VITALS — Ht 68.0 in | Wt 192.8 lb

## 2021-10-14 DIAGNOSIS — Z719 Counseling, unspecified: Secondary | ICD-10-CM

## 2021-10-14 DIAGNOSIS — E1129 Type 2 diabetes mellitus with other diabetic kidney complication: Secondary | ICD-10-CM

## 2021-10-14 DIAGNOSIS — F320 Major depressive disorder, single episode, mild: Secondary | ICD-10-CM

## 2021-10-14 DIAGNOSIS — Z720 Tobacco use: Secondary | ICD-10-CM

## 2021-10-14 DIAGNOSIS — K589 Irritable bowel syndrome without diarrhea: Secondary | ICD-10-CM

## 2021-10-14 NOTE — Progress Notes (Signed)
Patient ID: Stefanie Stewart, female    DOB: 12-30-1975, 46 y.o.   MRN: [id number]   Chief Complaint  Patient presents with   Advice Only    The patient is a 46 year old female here for further discussion about surgery.  She is 5 feet 8 inches tall and weighs 194 pounds.  The patient has been on a weight loss journey and has lost over 50 pounds.  She is doing regular exercise and has been keeping her weight stable for the last 6 months.  She is most interested in a brachioplasty and understands where the scars will be.  She also understands that she will possibly need 2 weeks off because of drains.  She works as a Art therapist.     Review of Systems  Constitutional: Negative.   Eyes: Negative.   Respiratory: Negative.  Negative for chest tightness and shortness of breath.   Cardiovascular: Negative.  Negative for leg swelling.  Gastrointestinal: Negative.   Endocrine: Negative.   Genitourinary: Negative.   Musculoskeletal: Negative.   Skin: Negative.   Hematological: Negative.     Past Medical History:  Diagnosis Date   Anxiety    Cervical syndrome    COPD (chronic obstructive pulmonary disease) ([place])    Depression    Edema 2013   legs   Endometriosis    Hyperlipidemia    Hypertension    IBS (irritable bowel syndrome)    Lump or mass in breast    left    Obesity    Plantar fibromatosis    Tobacco abuse    Type 2 diabetes mellitus with morbid obesity ([place]) 03/24/2020    Past Surgical History:  Procedure Laterality Date   BREAST BIOPSY Left 02/03/2016   apocrine metaplasia    CERVICAL BIOPSY  W/ LOOP ELECTRODE EXCISION     CESAREAN SECTION  1996   COLPOSCOPY  2007   LEEP  2007      Current Outpatient Medications:    aspirin-acetaminophen-caffeine (EXCEDRIN MIGRAINE) 250-250-65 MG tablet, Take by mouth every 6 (six) hours as needed for headache., Disp: , Rfl:    blood glucose meter kit and supplies KIT, Dispense based on patient and insurance preference.  Use up to four times daily as directed. (FOR ICD-9 250.00, 250.01)., Disp: 1 each, Rfl: 0   buPROPion (WELLBUTRIN XL) 150 MG 24 hr tablet, Take 1 tablet by mouth once daily, Disp: 90 tablet, Rfl: 1   cholecalciferol (VITAMIN D3) 25 MCG (1000 UNIT) tablet, Take 1,000 Units by mouth daily., Disp: , Rfl:    Cinnamon 500 MG capsule, Take 500 mg by mouth 2 (two) times daily., Disp: , Rfl:    fexofenadine (ALLEGRA ALLERGY) 180 MG tablet, Take 1 tablet (180 mg total) by mouth daily., Disp: 10 tablet, Rfl: 1   ibuprofen (ADVIL) 600 MG tablet, Take 1 tablet (600 mg total) by mouth every 6 (six) hours as needed., Disp: 30 tablet, Rfl: 0   losartan (COZAAR) 25 MG tablet, Take 1 tablet by mouth once daily, Disp: 90 tablet, Rfl: 1   metFORMIN (GLUCOPHAGE) 500 MG tablet, Take 1 tablet (500 mg total) by mouth daily with breakfast., Disp: 360 tablet, Rfl: 4   methocarbamol (ROBAXIN) 500 MG tablet, TAKE 1 TABLET BY MOUTH EVERY 8 HOURS AS NEEDED FOR MUSCLE SPASM, Disp: 30 tablet, Rfl: 0   norethindrone (AYGESTIN) 5 MG tablet, Take 1 tablet by mouth once daily, Disp: 90 tablet, Rfl: 4   Omega-3 1000 MG CAPS, Take  2 capsules by mouth daily., Disp: , Rfl:    sertraline (ZOLOFT) 50 MG tablet, TAKE 1 & 1/2 (ONE & ONE-HALF) TABLETS BY MOUTH ONCE DAILY, Disp: 250 tablet, Rfl: 4   TURMERIC PO, Take 1 tablet by mouth daily., Disp: , Rfl:    Objective:   There were no vitals filed for this visit.  Physical Exam Constitutional:      Appearance: Normal appearance.  Cardiovascular:     Rate and Rhythm: Normal rate.     Pulses: Normal pulses.  Pulmonary:     Effort: Pulmonary effort is normal.  Abdominal:     General: There is no distension.     Palpations: Abdomen is soft. There is no mass.     Tenderness: There is no abdominal tenderness.  Skin:    General: Skin is warm.     Capillary Refill: Capillary refill takes less than 2 seconds.  Neurological:     Mental Status: She is alert and oriented to person, place,  and time.  Psychiatric:        Mood and Affect: Mood normal.        Behavior: Behavior normal.        Thought Content: Thought content normal.        Judgment: Judgment normal.     Assessment & Plan:  Vapes nicotine containing substance  Current mild episode of major depressive disorder without prior episode (HCC)  Type 2 diabetes mellitus with proteinuria (HCC)  Irritable bowel syndrome, unspecified type  Plan for bilateral brachioplasty with liposuction of arms and axilla.  Pictures were obtained of the patient and placed in the chart with the patient's or guardian's permission.  We also talked about getting ready for the surgery with increasing her protein and decreasing her carbohydrates in her sugars.  [place], DO

## 2021-10-19 ENCOUNTER — Encounter: Payer: Self-pay | Admitting: *Deleted

## 2021-10-20 ENCOUNTER — Ambulatory Visit (INDEPENDENT_AMBULATORY_CARE_PROVIDER_SITE_OTHER): Payer: Self-pay | Admitting: Podiatry

## 2021-10-20 DIAGNOSIS — M7751 Other enthesopathy of right foot: Secondary | ICD-10-CM

## 2021-10-20 DIAGNOSIS — Q828 Other specified congenital malformations of skin: Secondary | ICD-10-CM

## 2021-10-26 NOTE — Progress Notes (Signed)
Subjective:  Patient ID: Stefanie Stewart, female    DOB: [birth year],  MRN: [id number]  Chief Complaint  Patient presents with   Callouses    two calluses on right foot -- diabetic foot exam, No numbness or tingling    46 y.o. female presents with the above complaint.  Patient presents with right submetatarsal 5 porokeratosis with underlying capsulitis.  Patient is a diabetic last A1c of 5.2%.  She would like to get eval she has not seen and was prior to seeing me hurts with ambulation.  Pain scale 7 out of 10.  She has not tried any shoe gear modification.  She is not an injection.   Review of Systems: Negative except as noted in the HPI. Denies N/V/F/Ch.  Past Medical History:  Diagnosis Date   Anxiety    Cervical syndrome    COPD (chronic obstructive pulmonary disease) ([place])    Depression    Edema 2013   legs   Endometriosis    Hyperlipidemia    Hypertension    IBS (irritable bowel syndrome)    Lump or mass in breast    left    Obesity    Plantar fibromatosis    Tobacco abuse    Type 2 diabetes mellitus with morbid obesity ([place]) 03/24/2020    Current Outpatient Medications:    aspirin-acetaminophen-caffeine (EXCEDRIN MIGRAINE) 250-250-65 MG tablet, Take by mouth every 6 (six) hours as needed for headache., Disp: , Rfl:    blood glucose meter kit and supplies KIT, Dispense based on patient and insurance preference. Use up to four times daily as directed. (FOR ICD-9 250.00, 250.01)., Disp: 1 each, Rfl: 0   buPROPion (WELLBUTRIN XL) 150 MG 24 hr tablet, Take 1 tablet by mouth once daily, Disp: 90 tablet, Rfl: 1   cholecalciferol (VITAMIN D3) 25 MCG (1000 UNIT) tablet, Take 1,000 Units by mouth daily., Disp: , Rfl:    Cinnamon 500 MG capsule, Take 500 mg by mouth 2 (two) times daily., Disp: , Rfl:    fexofenadine (ALLEGRA ALLERGY) 180 MG tablet, Take 1 tablet (180 mg total) by mouth daily., Disp: 10 tablet, Rfl: 1   ibuprofen (ADVIL) 600 MG tablet, Take 1 tablet (600 mg total)  by mouth every 6 (six) hours as needed., Disp: 30 tablet, Rfl: 0   losartan (COZAAR) 25 MG tablet, Take 1 tablet by mouth once daily, Disp: 90 tablet, Rfl: 1   metFORMIN (GLUCOPHAGE) 500 MG tablet, Take 1 tablet (500 mg total) by mouth daily with breakfast., Disp: 360 tablet, Rfl: 4   methocarbamol (ROBAXIN) 500 MG tablet, TAKE 1 TABLET BY MOUTH EVERY 8 HOURS AS NEEDED FOR MUSCLE SPASM, Disp: 30 tablet, Rfl: 0   norethindrone (AYGESTIN) 5 MG tablet, Take 1 tablet by mouth once daily, Disp: 90 tablet, Rfl: 4   Omega-3 1000 MG CAPS, Take 2 capsules by mouth daily., Disp: , Rfl:    sertraline (ZOLOFT) 50 MG tablet, TAKE 1 & 1/2 (ONE & ONE-HALF) TABLETS BY MOUTH ONCE DAILY, Disp: 250 tablet, Rfl: 4   TURMERIC PO, Take 1 tablet by mouth daily., Disp: , Rfl:   Social History   Tobacco Use  Smoking Status Former   Packs/day: 0.00   Years: 20.00   Total pack years: 0.00   Types: Cigarettes  Smokeless Tobacco Never    Allergies  Allergen Reactions   Bactrim [Sulfamethoxazole-Trimethoprim] Itching    Itching (skin and throat). Denies SOB or throat closing.   Banana Other (See Comments)  Reports lip swelling. Every now and then pt states she has nausea and vomiting.   Objective:  There were no vitals filed for this visit. There is no height or weight on file to calculate BMI. Constitutional Well developed. Well nourished.  Vascular Dorsalis pedis pulses palpable bilaterally. Posterior tibial pulses palpable bilaterally. Capillary refill normal to all digits.  No cyanosis or clubbing noted. Pedal hair growth normal.  Neurologic Normal speech. Oriented to person, place, and time. Epicritic sensation to light touch grossly present bilaterally.  Dermatologic Hyperkeratotic lesion with central nucleated core noted to right submetatarsal 5.  Pain on palpation to the lesion.  There is some underlying capsulitis noted.  With range of motion of the MTPJ joint.  Orthopedic: Normal joint ROM  without pain or crepitus bilaterally. No visible deformities. No bony tenderness.   Radiographs: None Assessment:   1. Capsulitis of metatarsophalangeal (MTP) joint of right foot   2. Porokeratosis    Plan:  Patient was evaluated and treated and all questions answered.  Right fifth metatarsophalangeal joint capsulitis with underlying porokeratosis -All questions and concerns were discussed with the patient in extensive detail -I discussed shoe gear modification offloading padding protective in extensive detail -Given the amount of pain that she is experienced she will benefit from injection to decrease the acute inflammatory component associated with pain as well as debridement of the lesion.  Patient agrees with plan like to proceed with both -A steroid injection was performed at right fifth MTPJ using 1% plain Lidocaine and 10 mg of Kenalog. This was well tolerated. -Using chisel blade handle the lesion was debrided to healthy striated tissue.  No complication noted no pinpoint bleeding noted  No follow-ups on file.

## 2021-10-27 ENCOUNTER — Ambulatory Visit: Payer: 59 | Admitting: Psychiatry

## 2021-11-01 ENCOUNTER — Ambulatory Visit: Payer: 59 | Admitting: Psychiatry

## 2021-11-20 ENCOUNTER — Telehealth: Payer: Self-pay | Admitting: *Deleted

## 2021-11-20 NOTE — Telephone Encounter (Signed)
LVM to schedule surgery

## 2021-11-27 ENCOUNTER — Other Ambulatory Visit: Payer: Self-pay | Admitting: Nurse Practitioner

## 2021-11-29 NOTE — Telephone Encounter (Signed)
Requested Prescriptions  Pending Prescriptions Disp Refills  . buPROPion (WELLBUTRIN XL) 150 MG 24 hr tablet [Pharmacy Med Name: buPROPion HCl ER (XL) 150 MG Oral Tablet Extended Release 24 Hour] 90 tablet 1    Sig: Take 1 tablet by mouth once daily     Psychiatry: Antidepressants - bupropion Passed - 11/27/2021  2:08 PM      Passed - Cr in normal range and within 360 days    Creat  Date Value Ref Range Status  05/14/2014 0.70 0.50 - 1.10 mg/dL Final   Creatinine, Ser  Date Value Ref Range Status  09/22/2021 0.82 0.57 - 1.00 mg/dL Final         Passed - AST in normal range and within 360 days    AST  Date Value Ref Range Status  09/22/2021 13 0 - 40 IU/L Final         Passed - ALT in normal range and within 360 days    ALT  Date Value Ref Range Status  09/22/2021 17 0 - 32 IU/L Final         Passed - Completed PHQ-2 or PHQ-9 in the last 360 days      Passed - Last BP in normal range    BP Readings from Last 1 Encounters:  09/28/21 107/68         Passed - Valid encounter within last 6 months    Recent Outpatient Visits          2 months ago Type 2 diabetes mellitus with proteinuria ([place])   [place], Jolene T, NP   8 months ago Type 2 diabetes mellitus with morbid obesity ([place])   [place], [name], NP   10 months ago Vitamin D deficiency   [facility] McElwee, Lauren A, NP   1 year ago Type 2 diabetes mellitus with morbid obesity ([place])   [place], Henrine Screws T, NP   1 year ago COVID-73   [place], Avanti, MD      Future Appointments            In 4 months [name], [name], NP [facility]

## 2021-12-01 ENCOUNTER — Ambulatory Visit (INDEPENDENT_AMBULATORY_CARE_PROVIDER_SITE_OTHER): Payer: 59 | Admitting: Psychiatry

## 2021-12-01 DIAGNOSIS — F411 Generalized anxiety disorder: Secondary | ICD-10-CM | POA: Diagnosis not present

## 2021-12-01 DIAGNOSIS — Z599 Problem related to housing and economic circumstances, unspecified: Secondary | ICD-10-CM

## 2021-12-01 DIAGNOSIS — F325 Major depressive disorder, single episode, in full remission: Secondary | ICD-10-CM

## 2021-12-01 DIAGNOSIS — Z566 Other physical and mental strain related to work: Secondary | ICD-10-CM

## 2021-12-01 DIAGNOSIS — E119 Type 2 diabetes mellitus without complications: Secondary | ICD-10-CM | POA: Diagnosis not present

## 2021-12-01 NOTE — Progress Notes (Signed)
Psychotherapy Progress Note [facility] [name], PhD LP  Patient ID: Stefanie Stewart)    MRN: [id number] Therapy format: Individual psychotherapy Date: 12/01/2021      Start: 10:15a     Stop: 11:05a     Time Spent: 50 min Location: In-person   Session narrative (presenting needs, interim history, self-report of stressors and symptoms, applications of prior therapy, status changes, and interventions made in session) She is aware her [employer] health plan (Friday) has expired and she is presently uninsured, consents to proceed on uninsured basis.  Would prefer to cover out of pocket than start a too-expensive insurance, will wait for new employer-sponsored insurance before filing.  Been adjusting to 4x10 work schedule, change in her walking routine, learning new processes and procedures, weathering growing pains for the office, being on call for several providers constantly through the work day, and working through lunches.  Does see relief coming with new hires, and figures to start getting up early to get her 2-mile walk in before work.  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  Hopeful she'll activate new health insurance promptly at her 90-day mark, as well as bonuses.  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  Says he is often enough wishy-washy about what he will do with a treatment and makes last-minute calls that impose burdens to get ready in a hurry, undo work done, or over-document trying to keep up with his either-or plans.  Baffled also by long staff meetings, typically held upstairs in an un-air-conditioned room.  Still pleased to be making better income and usually staying as busy as  hoped.  Separate issue with best friend Larene Beach, who hasn't invited her to much since the turn-down in May.  Credible word that she is overextended with responsibilities to family and two jobs, but now she doesn't answer the phone and only texts inconsistently.  Discussed what to make of it, how to approach if she wants to restore it.  Therapeutic modalities: Cognitive Behavioral Therapy, Solution-Oriented/Positive Psychology, and Assertiveness/Communication  Mental Status/Observations:  Appearance:   Casual     Behavior:  Appropriate  Motor:  Normal  Speech/Language:   Negative  Affect:  Appropriate  Mood:  normal  Thought process:  normal  Thought content:    WNL  Sensory/Perceptual disturbances:    WNL  Orientation:  Fully oriented  Attention:  Good    Concentration:  Good  Memory:  WNL  Insight:    Good  Judgment:   Good  Impulse Control:  Fair   Risk Assessment: Danger to Self: No Self-injurious Behavior: No Danger to Others: No Physical Aggression / Violence: No Duty to Warn: No Access to Firearms a concern: No  Assessment of progress:  progressing  Diagnosis:   ICD-10-CM   1. Generalized anxiety disorder  F41.1     2. Work stress  Z56.6     3. Financial problems  Z59.9     4. Type 2 diabetes mellitus without complication, without long-term current use of insulin (HCC)  E11.9     5. Major depressive disorder, single episode, in remission ([place])  F32.5      Plan:  Continue good diabetes care  with diet & exercise Continue integrating into the new work environment Continue communication advice with intrusive or otherwise pressuring family/friends Other recommendations/advice as may be noted above Continue to utilize previously learned skills ad lib Maintain medication as prescribed and work faithfully with relevant prescriber(s) if any changes are desired or seem indicated Call the clinic on-call service, 988/hotline, 911, or present to [facility] or ER if any  life-threatening psychiatric crisis Return for session(s) already scheduled. Already scheduled visit in this office 01/05/2022.  Blanchie Serve, PhD [name], PhD LP Clinical Psychologist, [facility] [address], [place], [place] [address] [phone]

## 2022-01-05 ENCOUNTER — Ambulatory Visit: Payer: 59 | Admitting: Psychiatry

## 2022-02-01 ENCOUNTER — Other Ambulatory Visit: Payer: Self-pay | Admitting: Nurse Practitioner

## 2022-02-01 NOTE — Telephone Encounter (Signed)
Dc'd 01/13/21 reorder Claire Shown NP   Requested Prescriptions  Refused Prescriptions Disp Refills   metFORMIN (GLUCOPHAGE) 500 MG tablet [Pharmacy Med Name: metFORMIN HCl 500 MG Oral Tablet] 360 tablet 0    Sig: TAKE 2 TABLETS BY MOUTH TWICE DAILY WITH MEALS     Endocrinology:  Diabetes - Biguanides Failed - 02/01/2022  6:23 AM      Failed - B12 Level in normal range and within 720 days    Vitamin B-12  Date Value Ref Range Status  01/13/2021 1,516 (H) 232 - 1,245 pg/mL Final         Failed - CBC within normal limits and completed in the last 12 months    WBC  Date Value Ref Range Status  01/13/2021 6.5 3.4 - 10.8 x10E3/uL Final  10/21/2014 10.9 (A) 4.6 - 10.2 K/uL Final   RBC  Date Value Ref Range Status  01/13/2021 4.56 3.77 - 5.28 x10E6/uL Final  10/21/2014 4.59 4.04 - 5.48 M/uL Final   Hemoglobin  Date Value Ref Range Status  01/13/2021 13.4 11.1 - 15.9 g/dL Final   Hematocrit  Date Value Ref Range Status  01/13/2021 40.7 34.0 - 46.6 % Final   MCHC  Date Value Ref Range Status  01/13/2021 32.9 31.5 - 35.7 g/dL Final  10/21/2014 33.9 31.8 - 35.4 g/dL Final   [facility]  Date Value Ref Range Status  01/13/2021 29.4 26.6 - 33.0 pg Final   MCH, POC  Date Value Ref Range Status  10/21/2014 29.2 27 - 31.2 pg Final   MCV  Date Value Ref Range Status  01/13/2021 89 79 - 97 fL Final   Platelet Count, POC  Date Value Ref Range Status  10/21/2014 338 142 - 424 K/uL Final   RDW  Date Value Ref Range Status  01/13/2021 11.8 11.7 - 15.4 % Final   RDW, POC  Date Value Ref Range Status  10/21/2014 12.3 % Final         Passed - Cr in normal range and within 360 days    Creat  Date Value Ref Range Status  05/14/2014 0.70 0.50 - 1.10 mg/dL Final   Creatinine, Ser  Date Value Ref Range Status  09/22/2021 0.82 0.57 - 1.00 mg/dL Final         Passed - HBA1C is between 0 and 7.9 and within 180 days    HB A1C (BAYER DCA - WAIVED)  Date Value Ref Range Status   09/22/2021 5.2 4.8 - 5.6 % Final    Comment:             Prediabetes: 5.7 - 6.4          Diabetes: >6.4          Glycemic control for adults with diabetes: <7.0          Passed - eGFR in normal range and within 360 days    GFR calc Af Amer  Date Value Ref Range Status  03/24/2020 124 >59 mL/min/1.73 Final    Comment:    **In accordance with recommendations from the NKF-ASN Task force,**   [facility] is in the process of updating its eGFR calculation to the   2021 CKD-EPI creatinine equation that estimates kidney function   without a race variable.    GFR calc non Af Amer  Date Value Ref Range Status  03/24/2020 107 >59 mL/min/1.73 Final   eGFR  Date Value Ref Range Status  09/22/2021 89 >59 mL/min/1.73 Final  Passed - Valid encounter within last 6 months    Recent Outpatient Visits           4 months ago Type 2 diabetes mellitus with proteinuria ([place])   [place], Jolene T, NP   10 months ago Type 2 diabetes mellitus with morbid obesity ([place])   [place], [name], NP   1 year ago Vitamin D deficiency   [place] McElwee, Lauren A, NP   1 year ago Type 2 diabetes mellitus with morbid obesity ([place])   [place], [name], NP   1 year ago COVID-8   [place], Avanti, MD       Future Appointments             In 1 month [name], [name], NP [facility]

## 2022-02-02 ENCOUNTER — Ambulatory Visit (INDEPENDENT_AMBULATORY_CARE_PROVIDER_SITE_OTHER): Payer: BC Managed Care – PPO | Admitting: Psychiatry

## 2022-02-02 DIAGNOSIS — F325 Major depressive disorder, single episode, in full remission: Secondary | ICD-10-CM

## 2022-02-02 DIAGNOSIS — E119 Type 2 diabetes mellitus without complications: Secondary | ICD-10-CM | POA: Diagnosis not present

## 2022-02-02 DIAGNOSIS — Z638 Other specified problems related to primary support group: Secondary | ICD-10-CM | POA: Diagnosis not present

## 2022-02-02 DIAGNOSIS — F411 Generalized anxiety disorder: Secondary | ICD-10-CM | POA: Diagnosis not present

## 2022-02-02 DIAGNOSIS — Z636 Dependent relative needing care at home: Secondary | ICD-10-CM

## 2022-02-02 NOTE — Progress Notes (Signed)
Psychotherapy Progress Note [facility] Stefanie Moore, PhD LP  Patient ID: Stefanie Stewart)    MRN: [id number] Therapy format: Individual psychotherapy Date: 02/02/2022      Start: 10:18a     Stop: 11:06a     Time Spent: 48 min Location: In-person   Session narrative (presenting needs, interim history, self-report of stressors and symptoms, applications of prior therapy, status changes, and interventions made in session) Issues creeping up -- Sunday evening gets a call from aunt Stefanie Stewart, who has a tangled history of mental and physical problems, tends to monopolize, decided to hold off answering her.  Tuesday brother called Stefanie Stewart to tell her Stefanie Stewart is dead.  Irritating history of her driving Stefanie Stewart and maybe others crazy with expectations and getting taken care of in all sorts of distorted ways was very irritating in the process of handling father's funeral.  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  Now Stefanie Stewart's neighbor Stefanie Stewart is getting in touch, offering to be the executor.  The will can't be found, it's a hoarder home, and he's been doing things for her while living, so coming across like he's fishing for the of access.  Stefanie Stewart and her cousins are having to figure out how to bury Stefanie Stewart and probably deal with probate and maybe a bankrupt estate.  Highly irritated getting dumped on, and now Thursday next week all of a sudden is a big business day to do priorities -- pick up her remains, try to engage a probate attorney, maybe find a will and other important papers in her objectively disgusting (pictures shared) home.  Stefanie Stewart has started looking for Stefanie Stewart, one benefit.  Brother Stefanie Stewart is alcoholic and a heavy smoker but turning ostrich, won't be able to count on him.  Support/empathy provided, encouraged in making her way through it, securing agreements ahead who does which job so they can stay  efficient.    Therapeutic modalities: Cognitive Behavioral Therapy, Solution-Oriented/Positive Psychology, and Ego-Supportive  Mental Status/Observations:  Appearance:   Casual     Behavior:  Appropriate  Motor:  Normal  Speech/Language:   Clear and Coherent  Affect:  Appropriate  Mood:  irritable  Thought process:  normal  Thought content:    WNL and catastrophizing  Sensory/Perceptual disturbances:    WNL  Orientation:  Fully oriented  Attention:  Good    Concentration:  Fair  Memory:  WNL  Insight:    Good  Judgment:   Good  Impulse Control:  Fair   Risk Assessment: Danger to Self: No Self-injurious Behavior: No Danger to Others: No Physical Aggression / Violence: No Duty to Warn: No Access to Firearms a concern: No  Assessment of progress:  stabilized  Diagnosis:   ICD-10-CM   1. Generalized anxiety disorder  F41.1     2. Type 2 diabetes mellitus without complication, without long-term current use of insulin (HCC)  E11.9     3. Major depressive disorder, single episode, in remission ([place])  F32.5     4. Relationship problem with family member  Z63.8     5. Caregiver stress  Z63.6      Plan:  Work through division of labor with relatives on Stefanie Stewart's house without presupposing trouble Continue good diabetes care with diet & exercise Continue integrating into the new work environment Continue communication advice with intrusive or otherwise pressuring family/friends Other recommendations/advice as may be  noted above Continue to utilize previously learned skills ad lib Maintain medication as prescribed and work faithfully with relevant prescriber(s) if any changes are desired or seem indicated Call the clinic on-call service, 988/hotline, 911, or present to [facility] or ER if any life-threatening psychiatric crisis Return for time as available. Already scheduled visit in this office 03/09/2022.  Blanchie Serve, PhD Stefanie Moore, PhD LP Clinical Psychologist, [facility] [address] [place],  [address] [phone]

## 2022-02-09 ENCOUNTER — Ambulatory Visit: Payer: BC Managed Care – PPO | Admitting: Podiatry

## 2022-02-18 ENCOUNTER — Other Ambulatory Visit: Payer: Self-pay | Admitting: Nurse Practitioner

## 2022-02-20 ENCOUNTER — Other Ambulatory Visit: Payer: Self-pay | Admitting: Nurse Practitioner

## 2022-02-20 NOTE — Telephone Encounter (Signed)
Dc'd "reorder" Vance Peper NP 01/13/21 (dose changed)  Requested Prescriptions  Refused Prescriptions Disp Refills   metFORMIN (GLUCOPHAGE) 500 MG tablet [Pharmacy Med Name: metFORMIN HCl 500 MG Oral Tablet] 360 tablet 0    Sig: TAKE 2 TABLETS BY MOUTH TWICE DAILY WITH MEALS     Endocrinology:  Diabetes - Biguanides Failed - 02/18/2022 11:30 AM      Failed - B12 Level in normal range and within 720 days    Vitamin B-12  Date Value Ref Range Status  01/13/2021 1,516 (H) 232 - 1,245 pg/mL Final         Failed - CBC within normal limits and completed in the last 12 months    WBC  Date Value Ref Range Status  01/13/2021 6.5 3.4 - 10.8 x10E3/uL Final  10/21/2014 10.9 (A) 4.6 - 10.2 K/uL Final   RBC  Date Value Ref Range Status  01/13/2021 4.56 3.77 - 5.28 x10E6/uL Final  10/21/2014 4.59 4.04 - 5.48 M/uL Final   Hemoglobin  Date Value Ref Range Status  01/13/2021 13.4 11.1 - 15.9 g/dL Final   Hematocrit  Date Value Ref Range Status  01/13/2021 40.7 34.0 - 46.6 % Final   MCHC  Date Value Ref Range Status  01/13/2021 32.9 31.5 - 35.7 g/dL Final  10/21/2014 33.9 31.8 - 35.4 g/dL Final   [facility]  Date Value Ref Range Status  01/13/2021 29.4 26.6 - 33.0 pg Final   MCH, POC  Date Value Ref Range Status  10/21/2014 29.2 27 - 31.2 pg Final   MCV  Date Value Ref Range Status  01/13/2021 89 79 - 97 fL Final   Platelet Count, POC  Date Value Ref Range Status  10/21/2014 338 142 - 424 K/uL Final   RDW  Date Value Ref Range Status  01/13/2021 11.8 11.7 - 15.4 % Final   RDW, POC  Date Value Ref Range Status  10/21/2014 12.3 % Final         Passed - Cr in normal range and within 360 days    Creat  Date Value Ref Range Status  05/14/2014 0.70 0.50 - 1.10 mg/dL Final   Creatinine, Ser  Date Value Ref Range Status  09/22/2021 0.82 0.57 - 1.00 mg/dL Final         Passed - HBA1C is between 0 and 7.9 and within 180 days    HB A1C (BAYER DCA - WAIVED)  Date Value Ref  Range Status  09/22/2021 5.2 4.8 - 5.6 % Final    Comment:             Prediabetes: 5.7 - 6.4          Diabetes: >6.4          Glycemic control for adults with diabetes: <7.0          Passed - eGFR in normal range and within 360 days    GFR calc Af Amer  Date Value Ref Range Status  03/24/2020 124 >59 mL/min/1.73 Final    Comment:    **In accordance with recommendations from the NKF-ASN Task force,**   [facility] is in the process of updating its eGFR calculation to the   2021 CKD-EPI creatinine equation that estimates kidney function   without a race variable.    GFR calc non Af Amer  Date Value Ref Range Status  03/24/2020 107 >59 mL/min/1.73 Final   eGFR  Date Value Ref Range Status  09/22/2021 89 >59 mL/min/1.73 Final  Passed - Valid encounter within last 6 months    Recent Outpatient Visits           5 months ago Type 2 diabetes mellitus with proteinuria ([place])   [place], Jolene T, NP   11 months ago Type 2 diabetes mellitus with morbid obesity ([place])   [place], [name], NP   1 year ago Vitamin D deficiency   [place] McElwee, Lauren A, NP   1 year ago Type 2 diabetes mellitus with morbid obesity ([place])   [place], [name], NP   1 year ago COVID-37   [place] Vigg, Avanti, MD       Future Appointments             In 1 month [name], [name], NP [facility]

## 2022-02-20 NOTE — Telephone Encounter (Signed)
Medication Refill - Medication: metFORMIN (GLUCOPHAGE) 500 MG tablet   Has the patient contacted their pharmacy? Yes.    (Agent: If yes, when and what did the pharmacy advise?) Patient states she called the pharmacy twice and now she is out of her medication for 2 days. Patient would like request expedited.   Preferred Pharmacy (with phone number or street name):  [facility], [place] Phone: [phone]  Fax: [phone]      Has the patient been seen for an appointment in the last year OR does the patient have an upcoming appointment? Yes.    Agent: Please be advised that RX refills may take up to 3 business days. We ask that you follow-up with your pharmacy.

## 2022-02-21 ENCOUNTER — Other Ambulatory Visit: Payer: Self-pay | Admitting: Nurse Practitioner

## 2022-02-21 MED ORDER — METFORMIN HCL 500 MG PO TABS: 500.0000 mg | ORAL_TABLET | Freq: Every day | ORAL | 4 refills | Status: DC

## 2022-02-21 NOTE — Telephone Encounter (Signed)
Please review message from the [facility] nurse.

## 2022-02-21 NOTE — Telephone Encounter (Signed)
Requested medication (s) are due for refill today:   Yes  Pt has been out for 2 days    Requested medication (s) are on the active medication list:   Yes  Future visit scheduled:   Yes 03/30/2022 with Jolene   Last ordered: There is a dose discrepancy.   On 01/13/2021 dose was changed to 2  500 mg pills daily by Vance Peper, NP.   During the [place] with Jolene [name] on 09/22/2021 in her notes it is noted to continue taking 500 mg daily (1 pill)  Do not see where dose was changed from 1000 mg total down to 500 mg total.  Please review  Requested Prescriptions  Pending Prescriptions Disp Refills   metFORMIN (GLUCOPHAGE) 500 MG tablet 360 tablet 4    Sig: Take 1 tablet (500 mg total) by mouth daily with breakfast.     Endocrinology:  Diabetes - Biguanides Failed - 02/20/2022  4:05 PM      Failed - B12 Level in normal range and within 720 days    Vitamin B-12  Date Value Ref Range Status  01/13/2021 1,516 (H) 232 - 1,245 pg/mL Final         Failed - CBC within normal limits and completed in the last 12 months    WBC  Date Value Ref Range Status  01/13/2021 6.5 3.4 - 10.8 x10E3/uL Final  10/21/2014 10.9 (A) 4.6 - 10.2 K/uL Final   RBC  Date Value Ref Range Status  01/13/2021 4.56 3.77 - 5.28 x10E6/uL Final  10/21/2014 4.59 4.04 - 5.48 M/uL Final   Hemoglobin  Date Value Ref Range Status  01/13/2021 13.4 11.1 - 15.9 g/dL Final   Hematocrit  Date Value Ref Range Status  01/13/2021 40.7 34.0 - 46.6 % Final   MCHC  Date Value Ref Range Status  01/13/2021 32.9 31.5 - 35.7 g/dL Final  10/21/2014 33.9 31.8 - 35.4 g/dL Final   [facility]  Date Value Ref Range Status  01/13/2021 29.4 26.6 - 33.0 pg Final   MCH, POC  Date Value Ref Range Status  10/21/2014 29.2 27 - 31.2 pg Final   MCV  Date Value Ref Range Status  01/13/2021 89 79 - 97 fL Final   Platelet Count, POC  Date Value Ref Range Status  10/21/2014 338 142 - 424 K/uL Final   RDW  Date Value Ref Range Status   01/13/2021 11.8 11.7 - 15.4 % Final   RDW, POC  Date Value Ref Range Status  10/21/2014 12.3 % Final         Passed - Cr in normal range and within 360 days    Creat  Date Value Ref Range Status  05/14/2014 0.70 0.50 - 1.10 mg/dL Final   Creatinine, Ser  Date Value Ref Range Status  09/22/2021 0.82 0.57 - 1.00 mg/dL Final         Passed - HBA1C is between 0 and 7.9 and within 180 days    HB A1C (BAYER DCA - WAIVED)  Date Value Ref Range Status  09/22/2021 5.2 4.8 - 5.6 % Final    Comment:             Prediabetes: 5.7 - 6.4          Diabetes: >6.4          Glycemic control for adults with diabetes: <7.0          Passed - eGFR in normal range and within 360 days  GFR calc Af Amer  Date Value Ref Range Status  03/24/2020 124 >59 mL/min/1.73 Final    Comment:    **In accordance with recommendations from the NKF-ASN Task force,**   [facility] is in the process of updating its eGFR calculation to the   2021 CKD-EPI creatinine equation that estimates kidney function   without a race variable.    GFR calc non Af Amer  Date Value Ref Range Status  03/24/2020 107 >59 mL/min/1.73 Final   eGFR  Date Value Ref Range Status  09/22/2021 89 >59 mL/min/1.73 Final         Passed - Valid encounter within last 6 months    Recent Outpatient Visits           5 months ago Type 2 diabetes mellitus with proteinuria ([place])   [place], Jolene T, NP   11 months ago Type 2 diabetes mellitus with morbid obesity ([place])   [place], [name], NP   1 year ago Vitamin D deficiency   [facility] McElwee, Lauren A, NP   1 year ago Type 2 diabetes mellitus with morbid obesity ([place])   [place], [name], NP   1 year ago COVID-74   [place] Vigg, Avanti, MD       Future Appointments             In 1 month [name], [name], NP [facility]

## 2022-02-21 NOTE — Telephone Encounter (Signed)
Unable to refill per protocol, last refill by  provider 02/21/22 360 and 1 RF. Will refuse duplicate request.  Requested Prescriptions  Pending Prescriptions Disp Refills   metFORMIN (GLUCOPHAGE) 500 MG tablet [Pharmacy Med Name: metFORMIN HCl 500 MG Oral Tablet] 360 tablet 0    Sig: TAKE 2 TABLETS BY MOUTH TWICE DAILY WITH MEALS     Endocrinology:  Diabetes - Biguanides Failed - 02/21/2022  5:23 AM      Failed - B12 Level in normal range and within 720 days    Vitamin B-12  Date Value Ref Range Status  01/13/2021 1,516 (H) 232 - 1,245 pg/mL Final         Failed - CBC within normal limits and completed in the last 12 months    WBC  Date Value Ref Range Status  01/13/2021 6.5 3.4 - 10.8 x10E3/uL Final  10/21/2014 10.9 (A) 4.6 - 10.2 K/uL Final   RBC  Date Value Ref Range Status  01/13/2021 4.56 3.77 - 5.28 x10E6/uL Final  10/21/2014 4.59 4.04 - 5.48 M/uL Final   Hemoglobin  Date Value Ref Range Status  01/13/2021 13.4 11.1 - 15.9 g/dL Final   Hematocrit  Date Value Ref Range Status  01/13/2021 40.7 34.0 - 46.6 % Final   MCHC  Date Value Ref Range Status  01/13/2021 32.9 31.5 - 35.7 g/dL Final  10/21/2014 33.9 31.8 - 35.4 g/dL Final   [facility]  Date Value Ref Range Status  01/13/2021 29.4 26.6 - 33.0 pg Final   MCH, POC  Date Value Ref Range Status  10/21/2014 29.2 27 - 31.2 pg Final   MCV  Date Value Ref Range Status  01/13/2021 89 79 - 97 fL Final   Platelet Count, POC  Date Value Ref Range Status  10/21/2014 338 142 - 424 K/uL Final   RDW  Date Value Ref Range Status  01/13/2021 11.8 11.7 - 15.4 % Final   RDW, POC  Date Value Ref Range Status  10/21/2014 12.3 % Final         Passed - Cr in normal range and within 360 days    Creat  Date Value Ref Range Status  05/14/2014 0.70 0.50 - 1.10 mg/dL Final   Creatinine, Ser  Date Value Ref Range Status  09/22/2021 0.82 0.57 - 1.00 mg/dL Final         Passed - HBA1C is between 0 and 7.9 and within 180  days    HB A1C (BAYER DCA - WAIVED)  Date Value Ref Range Status  09/22/2021 5.2 4.8 - 5.6 % Final    Comment:             Prediabetes: 5.7 - 6.4          Diabetes: >6.4          Glycemic control for adults with diabetes: <7.0          Passed - eGFR in normal range and within 360 days    GFR calc Af Amer  Date Value Ref Range Status  03/24/2020 124 >59 mL/min/1.73 Final    Comment:    **In accordance with recommendations from the NKF-ASN Task force,**   [facility] is in the process of updating its eGFR calculation to the   2021 CKD-EPI creatinine equation that estimates kidney function   without a race variable.    GFR calc non Af Amer  Date Value Ref Range Status  03/24/2020 107 >59 mL/min/1.73 Final   eGFR  Date Value  Ref Range Status  09/22/2021 89 >59 mL/min/1.73 Final         Passed - Valid encounter within last 6 months    Recent Outpatient Visits           5 months ago Type 2 diabetes mellitus with proteinuria ([place])   [place], Jolene T, NP   11 months ago Type 2 diabetes mellitus with morbid obesity ([place])   [place], [name], NP   1 year ago Vitamin D deficiency   [facility] McElwee, Lauren A, NP   1 year ago Type 2 diabetes mellitus with morbid obesity ([place])   [place], [name], NP   1 year ago COVID-75   [place] Vigg, Avanti, MD       Future Appointments             In 1 month [name], [name], NP [facility]

## 2022-02-22 ENCOUNTER — Other Ambulatory Visit: Payer: Self-pay | Admitting: Nurse Practitioner

## 2022-02-22 NOTE — Telephone Encounter (Signed)
[facility] but d/t long hold time, called office and spoke with [place] CMA. Advised that dose was still issue. Yesterday metformin was sent in "phone in" by [facility] for '500mg'$  1 tab QAM. Got refill request this morning for '500mg'$  2tabs BID. [place], [place] said per OV note from 08/2021 that dose is for '500mg'$  1 tab QAM. Advised her I would call [facility] back and get 2tab BID removed.  [place] again, spoke with April, Tech regarding metformin. Advised her that correct dose is suppose to be for '500mg'$  1 tab QAM. Informed her that it was "sent" yesterday but they didn't receive so I sent electronic today for #90/4 RF. She states they will be on lookout for it and removed 2tabs BID from chart.

## 2022-03-02 ENCOUNTER — Ambulatory Visit: Payer: BC Managed Care – PPO | Admitting: Podiatry

## 2022-03-02 VITALS — BP 128/72

## 2022-03-02 DIAGNOSIS — M216X1 Other acquired deformities of right foot: Secondary | ICD-10-CM | POA: Diagnosis not present

## 2022-03-02 NOTE — Progress Notes (Signed)
Subjective:  Patient ID: Stefanie Stewart, female    DOB: 18-Mar-1976,  MRN: [id number]  Chief Complaint  Patient presents with   Callouses    46 y.o. female presents with the above complaint.  Patient presents for follow-up to right submetatarsal 5 porokeratotic lesion with underlying plantarflexed fifth metatarsal.  Patient states started to hurt again and is causing some pain he would like to discuss other treatment options decide injection.   Review of Systems: Negative except as noted in the HPI. Denies N/V/F/Ch.  Past Medical History:  Diagnosis Date   Anxiety    Cervical syndrome    COPD (chronic obstructive pulmonary disease) ([place])    Depression    Edema 2013   legs   Endometriosis    Hyperlipidemia    Hypertension    IBS (irritable bowel syndrome)    Lump or mass in breast    left    Obesity    Plantar fibromatosis    Tobacco abuse    Type 2 diabetes mellitus with morbid obesity ([place]) 03/24/2020    Current Outpatient Medications:    aspirin-acetaminophen-caffeine (EXCEDRIN MIGRAINE) 250-250-65 MG tablet, Take by mouth every 6 (six) hours as needed for headache., Disp: , Rfl:    blood glucose meter kit and supplies KIT, Dispense based on patient and insurance preference. Use up to four times daily as directed. (FOR ICD-9 250.00, 250.01)., Disp: 1 each, Rfl: 0   buPROPion (WELLBUTRIN XL) 150 MG 24 hr tablet, Take 1 tablet by mouth once daily, Disp: 90 tablet, Rfl: 1   cholecalciferol (VITAMIN D3) 25 MCG (1000 UNIT) tablet, Take 1,000 Units by mouth daily., Disp: , Rfl:    Cinnamon 500 MG capsule, Take 500 mg by mouth 2 (two) times daily., Disp: , Rfl:    fexofenadine (ALLEGRA ALLERGY) 180 MG tablet, Take 1 tablet (180 mg total) by mouth daily., Disp: 10 tablet, Rfl: 1   ibuprofen (ADVIL) 600 MG tablet, Take 1 tablet (600 mg total) by mouth every 6 (six) hours as needed., Disp: 30 tablet, Rfl: 0   losartan (COZAAR) 25 MG tablet, Take 1 tablet by mouth once daily, Disp:  90 tablet, Rfl: 1   metFORMIN (GLUCOPHAGE) 500 MG tablet, Take 1 tablet (500 mg total) by mouth daily with breakfast. 1 tablet (511m)  once daily with breakfast, Disp: 90 tablet, Rfl: 4   methocarbamol (ROBAXIN) 500 MG tablet, TAKE 1 TABLET BY MOUTH EVERY 8 HOURS AS NEEDED FOR MUSCLE SPASM, Disp: 30 tablet, Rfl: 0   norethindrone (AYGESTIN) 5 MG tablet, Take 1 tablet by mouth once daily, Disp: 90 tablet, Rfl: 4   Omega-3 1000 MG CAPS, Take 2 capsules by mouth daily., Disp: , Rfl:    sertraline (ZOLOFT) 50 MG tablet, TAKE 1 & 1/2 (ONE & ONE-HALF) TABLETS BY MOUTH ONCE DAILY, Disp: 250 tablet, Rfl: 4   TURMERIC PO, Take 1 tablet by mouth daily., Disp: , Rfl:   Social History   Tobacco Use  Smoking Status Former   Packs/day: 0.00   Years: 20.00   Total pack years: 0.00   Types: Cigarettes  Smokeless Tobacco Never    Allergies  Allergen Reactions   Bactrim [Sulfamethoxazole-Trimethoprim] Itching    Itching (skin and throat). Denies SOB or throat closing.   Banana Other (See Comments)    Reports lip swelling. Every now and then pt states she has nausea and vomiting.   Objective:   Vitals:   03/02/22 0829  BP: 128/72   There is no  height or weight on file to calculate BMI. Constitutional Well developed. Well nourished.  Vascular Dorsalis pedis pulses palpable bilaterally. Posterior tibial pulses palpable bilaterally. Capillary refill normal to all digits.  No cyanosis or clubbing noted. Pedal hair growth normal.  Neurologic Normal speech. Oriented to person, place, and time. Epicritic sensation to light touch grossly present bilaterally.  Dermatologic Hyperkeratotic lesion with central nucleated core noted to right submetatarsal 5.  Pain on palpation to the lesion.  There is some underlying capsulitis noted.  With range of motion of the MTPJ joint.  Plantarflexed fifth metatarsal noted  Orthopedic: Normal joint ROM without pain or crepitus bilaterally. No visible  deformities. No bony tenderness.   Radiographs: None Assessment:   1. Plantar flexed metatarsal, right     Plan:  Patient was evaluated and treated and all questions answered.  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  He will think about the procedure and will get back to me when he is ready -Using chisel blade handle the lesion was debrided to healthy striated tissue.  No complication noted no pinpoint bleeding noted  No follow-ups on file.

## 2022-03-09 ENCOUNTER — Ambulatory Visit (INDEPENDENT_AMBULATORY_CARE_PROVIDER_SITE_OTHER): Payer: BC Managed Care – PPO | Admitting: Psychiatry

## 2022-03-09 DIAGNOSIS — E119 Type 2 diabetes mellitus without complications: Secondary | ICD-10-CM | POA: Diagnosis not present

## 2022-03-09 DIAGNOSIS — F411 Generalized anxiety disorder: Secondary | ICD-10-CM | POA: Diagnosis not present

## 2022-03-09 DIAGNOSIS — Z636 Dependent relative needing care at home: Secondary | ICD-10-CM

## 2022-03-09 DIAGNOSIS — Z638 Other specified problems related to primary support group: Secondary | ICD-10-CM

## 2022-03-09 DIAGNOSIS — F325 Major depressive disorder, single episode, in full remission: Secondary | ICD-10-CM

## 2022-03-09 DIAGNOSIS — N951 Menopausal and female climacteric states: Secondary | ICD-10-CM

## 2022-03-09 DIAGNOSIS — Z566 Other physical and mental strain related to work: Secondary | ICD-10-CM

## 2022-03-09 NOTE — Progress Notes (Signed)
Psychotherapy Progress Note [facility] Stefanie Moore, PhD LP  Patient ID: Stefanie Stewart)    MRN: [id number] Therapy format: Individual psychotherapy Date: 03/09/2022      Start: 10:12a     Stop: 11:00a     Time Spent: 48 min Location: In-person   Session narrative (presenting needs, interim history, self-report of stressors and symptoms, applications of prior therapy, status changes, and interventions made in session) Last time she was poised to go to mtns to Stefanie Stewart, with Stefanie Stewart, expecting a completely disgusting house.  Did find house as forecast, but was able to locate important papers and precious mementoes of her own mother relatively quickly, took valuables to the atty, had unexpectedly favorable help from cousin Stefanie Stewart, and has been working through Financial risk analyst and arrangements among the 4 of them.  Drama ensuing with Stefanie Stewart renouncing his role as co-executor, probate process, and was taken aback having him address her as if she intended to Stewart as Stefanie Stewart.  Really would rather not.  Another issue now with realtor the cousin hired, and revelation that Stefanie Stewart was able to get into Stefanie Stewart's phone and retrieve information -- it's helpful to the estate, but it's ringing mistrust bells with Stefanie Stewart.    Advised to trust Stefanie Stewart, he sounds like he is merely helping the attorney's office and being transparent.  Worked through another issue with realtor trying to sell her on the idea of flipping the house to her family business, which feels dirty.  Having made the attorney's office the executor, advised just refer realtor to them.    Meanwhile, dealing with a friend trying to make plans for hanging out, and jumping to conclusions.  Analyzed some language and text to see if Stefanie Stewart is reading too much in.  Been taking feedback lately that she brings a lot of anxiety, maybe suspicion, to things.  Validated she does, though maybe it's more temporary than people hear about, and  she does tend to speak it if it's on her mind.  Addressed schedule -- let scheduling ahead lapse, will be > 2 mo until next seen.  Oriented how to gain access to schedule and signal urgent need.  Therapeutic modalities: Cognitive Behavioral Therapy, Solution-Oriented/Positive Psychology, and Ego-Supportive  Mental Status/Observations:  Appearance:   Casual     Behavior:  Appropriate  Motor:  Normal  Speech/Language:   Clear and Coherent  Affect:  Appropriate  Mood:  A little wary  Thought process:  normal  Thought content:    worry  Sensory/Perceptual disturbances:    WNL  Orientation:  Fully oriented  Attention:  Good    Concentration:  Good  Memory:  WNL  Insight:    Good  Judgment:   Good  Impulse Control:  Fair   Risk Assessment: Danger to Self: No Self-injurious Behavior: No Danger to Others: No Physical Aggression / Violence: No Duty to Warn: No Access to Firearms a concern: No  Assessment of progress:  progressing  Diagnosis:   ICD-10-CM   1. Generalized anxiety disorder  F41.1     2. Type 2 diabetes mellitus without complication, without long-term current use of insulin (HCC)  E11.9     3. Major depressive disorder, single episode, in remission (Stefanie Stewart)  F32.5     4. Relationship problem with family member  Z63.8     5. Caregiver stress  Z63.6     6. Work stress  Z56.6     7. Perimenopausal  N95.1  Plan:  Work through tasks of estate business with relatives, trust until proven otherwise, but retain the right to decide for herself how to conduct things Continue good diabetes care with diet & exercise Continue integrating into the new work environment Continue communication advice with intrusive or otherwise pressuring family/friends Other recommendations/advice as may be noted above Continue to utilize previously learned skills ad lib Maintain medication as prescribed and work faithfully with relevant prescriber(s) if any changes are desired or seem  indicated Call the clinic on-call service, 988/hotline, 911, or present to Stefanie Stewart or ER if any life-threatening psychiatric crisis Return for time as available, put on [place] list. Already scheduled visit in this office 06/01/2022.  Stefanie Serve, PhD Stefanie Moore, PhD LP Clinical Psychologist, [facility] [address] [place], [place] [address] [phone]

## 2022-03-20 ENCOUNTER — Other Ambulatory Visit: Payer: Self-pay | Admitting: Nurse Practitioner

## 2022-03-22 NOTE — Telephone Encounter (Signed)
Requested Prescriptions  Pending Prescriptions Disp Refills   losartan (COZAAR) 25 MG tablet [Pharmacy Med Name: Losartan Potassium 25 MG Oral Tablet] 90 tablet 0    Sig: Take 1 tablet by mouth once daily     Cardiovascular:  Angiotensin Receptor Blockers Passed - 03/20/2022 11:17 AM      Passed - Cr in normal range and within 180 days    Creat  Date Value Ref Range Status  05/14/2014 0.70 0.50 - 1.10 mg/dL Final   Creatinine, Ser  Date Value Ref Range Status  09/22/2021 0.82 0.57 - 1.00 mg/dL Final         Passed - K in normal range and within 180 days    Potassium  Date Value Ref Range Status  09/22/2021 4.6 3.5 - 5.2 mmol/L Final         Passed - Patient is not pregnant      Passed - Last BP in normal range    BP Readings from Last 1 Encounters:  03/02/22 128/72         Passed - Valid encounter within last 6 months    Recent Outpatient Visits           6 months ago Type 2 diabetes mellitus with proteinuria ([place])   [place], Jolene T, NP   12 months ago Type 2 diabetes mellitus with morbid obesity ([place])   [place], [name], NP   1 year ago Vitamin D deficiency   [facility] McElwee, Lauren A, NP   1 year ago Type 2 diabetes mellitus with morbid obesity ([place])   [place], Henrine Screws T, NP   1 year ago COVID-10   [place] Vigg, Avanti, MD       Future Appointments             In 1 week [name], [name], NP [facility]

## 2022-03-26 NOTE — Patient Instructions (Signed)
Diabetes Mellitus Basics  Diabetes mellitus, or diabetes, is a long-term (chronic) disease. It occurs when the body does not properly use sugar (glucose) that is released from food after you eat. Diabetes mellitus may be caused by one or both of these problems: Your pancreas does not make enough of a hormone called insulin. Your body does not react in a normal way to the insulin that it makes. Insulin lets glucose enter cells in your body. This gives you energy. If you have diabetes, glucose cannot get into cells. This causes high blood glucose (hyperglycemia). How to treat and manage diabetes You may need to take insulin or other diabetes medicines daily to keep your glucose in balance. If you are prescribed insulin, you will learn how to give yourself insulin by injection. You may need to adjust the amount of insulin you take based on the foods that you eat. You will need to check your blood glucose levels using a glucose monitor as told by your health care provider. The readings can help determine if you have low or high blood glucose. Generally, you should have these blood glucose levels: Before meals (preprandial): 80-130 mg/dL (4.4-7.2 mmol/L). After meals (postprandial): below 180 mg/dL (10 mmol/L). Hemoglobin A1c (HbA1c) level: less than 7%. Your health care provider will set treatment goals for you. Keep all follow-up visits. This is important. Follow these instructions at home: Diabetes medicines Take your diabetes medicines every day as told by your health care provider. List your diabetes medicines here: Name of medicine: ______________________________ Amount (dose): _______________ Time (a.m./p.m.): _______________ Notes: ___________________________________ Name of medicine: ______________________________ Amount (dose): _______________ Time (a.m./p.m.): _______________ Notes: ___________________________________ Name of medicine: ______________________________ Amount (dose):  _______________ Time (a.m./p.m.): _______________ Notes: ___________________________________ Insulin If you use insulin, list the types of insulin you use here: Insulin type: ______________________________ Amount (dose): _______________ Time (a.m./p.m.): _______________Notes: ___________________________________ Insulin type: ______________________________ Amount (dose): _______________ Time (a.m./p.m.): _______________ Notes: ___________________________________ Insulin type: ______________________________ Amount (dose): _______________ Time (a.m./p.m.): _______________ Notes: ___________________________________ Insulin type: ______________________________ Amount (dose): _______________ Time (a.m./p.m.): _______________ Notes: ___________________________________ Insulin type: ______________________________ Amount (dose): _______________ Time (a.m./p.m.): _______________ Notes: ___________________________________ Managing blood glucose  Check your blood glucose levels using a glucose monitor as told by your health care provider. Write down the times that you check your glucose levels here: Time: _______________ Notes: ___________________________________ Time: _______________ Notes: ___________________________________ Time: _______________ Notes: ___________________________________ Time: _______________ Notes: ___________________________________ Time: _______________ Notes: ___________________________________ Time: _______________ Notes: ___________________________________  Low blood glucose Low blood glucose (hypoglycemia) is when glucose is at or below 70 mg/dL (3.9 mmol/L). Symptoms may include: Feeling: Hungry. Sweaty and clammy. Irritable or easily upset. Dizzy. Sleepy. Having: A fast heartbeat. A headache. A change in your vision. Numbness around the mouth, lips, or tongue. Having trouble with: Moving (coordination). Sleeping. Treating low blood glucose To treat low blood  glucose, eat or drink something containing sugar right away. If you can think clearly and swallow safely, follow the 15:15 rule: Take 15 grams of a fast-acting carb (carbohydrate), as told by your health care provider. Some fast-acting carbs are: Glucose tablets: take 3-4 tablets. Hard candy: eat 3-5 pieces. Fruit juice: drink 4 oz (120 mL). Regular (not diet) soda: drink 4-6 oz (120-180 mL). Honey or sugar: eat 1 Tbsp (15 mL). Check your blood glucose levels 15 minutes after you take the carb. If your glucose is still at or below 70 mg/dL (3.9 mmol/L), take 15 grams of a carb again. If your glucose does not go above 70 mg/dL (3.9 mmol/L) after   3 tries, get help right away. After your glucose goes back to normal, eat a meal or a snack within 1 hour. Treating very low blood glucose If your glucose is at or below 54 mg/dL (3 mmol/L), you have very low blood glucose (severe hypoglycemia). This is an emergency. Do not wait to see if the symptoms will go away. Get medical help right away. Call your local emergency services (911 in the [place] Do not drive yourself to the hospital. Questions to ask your health care provider Should I talk with a diabetes educator? What equipment will I need to care for myself at home? What diabetes medicines do I need? When should I take them? How often do I need to check my blood glucose levels? What number can I call if I have questions? When is my follow-up visit? Where can I find a support group for people with diabetes? Where to find more information American Diabetes Association: www.diabetes.org Association of Diabetes Care and Education Specialists: www.diabeteseducator.org Contact a health care provider if: Your blood glucose is at or above 240 mg/dL (13.3 mmol/L) for 2 days in a row. You have been sick or have had a fever for 2 days or more, and you are not getting better. You have any of these problems for more than 6 hours: You cannot eat or  drink. You feel nauseous. You vomit. You have diarrhea. Get help right away if: Your blood glucose is lower than 54 mg/dL (3 mmol/L). You get confused. You have trouble thinking clearly. You have trouble breathing. These symptoms may represent a serious problem that is an emergency. Do not wait to see if the symptoms will go away. Get medical help right away. Call your local emergency services (911 in the [place] Do not drive yourself to the hospital. Summary Diabetes mellitus is a chronic disease that occurs when the body does not properly use sugar (glucose) that is released from food after you eat. Take insulin and diabetes medicines as told. Check your blood glucose every day, as often as told. Keep all follow-up visits. This is important. This information is not intended to replace advice given to you by your health care provider. Make sure you discuss any questions you have with your health care provider. Document Revised: 07/14/2019 Document Reviewed: 07/14/2019 Elsevier Patient Education  2023 Elsevier Inc.  

## 2022-03-30 ENCOUNTER — Encounter: Payer: Self-pay | Admitting: Nurse Practitioner

## 2022-03-30 ENCOUNTER — Ambulatory Visit: Payer: BC Managed Care – PPO | Admitting: Nurse Practitioner

## 2022-03-30 ENCOUNTER — Telehealth: Payer: Self-pay

## 2022-03-30 VITALS — BP 111/66 | HR 67 | Temp 98.0°F | Ht 67.99 in | Wt 194.1 lb

## 2022-03-30 DIAGNOSIS — E1129 Type 2 diabetes mellitus with other diabetic kidney complication: Secondary | ICD-10-CM | POA: Diagnosis not present

## 2022-03-30 DIAGNOSIS — Z6828 Body mass index (BMI) 28.0-28.9, adult: Secondary | ICD-10-CM

## 2022-03-30 DIAGNOSIS — E1169 Type 2 diabetes mellitus with other specified complication: Secondary | ICD-10-CM | POA: Diagnosis not present

## 2022-03-30 DIAGNOSIS — N951 Menopausal and female climacteric states: Secondary | ICD-10-CM | POA: Insufficient documentation

## 2022-03-30 DIAGNOSIS — R809 Proteinuria, unspecified: Secondary | ICD-10-CM

## 2022-03-30 DIAGNOSIS — F320 Major depressive disorder, single episode, mild: Secondary | ICD-10-CM | POA: Diagnosis not present

## 2022-03-30 DIAGNOSIS — E1159 Type 2 diabetes mellitus with other circulatory complications: Secondary | ICD-10-CM | POA: Diagnosis not present

## 2022-03-30 DIAGNOSIS — R7401 Elevation of levels of liver transaminase levels: Secondary | ICD-10-CM

## 2022-03-30 DIAGNOSIS — I152 Hypertension secondary to endocrine disorders: Secondary | ICD-10-CM | POA: Diagnosis not present

## 2022-03-30 DIAGNOSIS — Z720 Tobacco use: Secondary | ICD-10-CM

## 2022-03-30 DIAGNOSIS — E785 Hyperlipidemia, unspecified: Secondary | ICD-10-CM

## 2022-03-30 DIAGNOSIS — M791 Myalgia, unspecified site: Secondary | ICD-10-CM

## 2022-03-30 DIAGNOSIS — T466X5A Adverse effect of antihyperlipidemic and antiarteriosclerotic drugs, initial encounter: Secondary | ICD-10-CM

## 2022-03-30 LAB — BAYER DCA HB A1C WAIVED: HB A1C (BAYER DCA - WAIVED): 5.3 % (ref 4.8–5.6)

## 2022-03-30 LAB — MICROALBUMIN, URINE WAIVED
Creatinine, Urine Waived: 200 mg/dL (ref 10–300)
Microalb, Ur Waived: 20 mg/L — ABNORMAL HIGH (ref 0–19)
Microalb/Creat Ratio: <30 mg/g (ref <30)

## 2022-03-30 MED ORDER — BUPROPION HCL ER (XL) 150 MG PO TB24: 150.0000 mg | ORAL_TABLET | Freq: Every day | ORAL | 4 refills | Status: DC

## 2022-03-30 MED ORDER — OZEMPIC (0.25 OR 0.5 MG/DOSE) 2 MG/3ML ~~LOC~~ SOPN: PEN_INJECTOR | SUBCUTANEOUS | 4 refills | Status: DC

## 2022-03-30 MED ORDER — SERTRALINE HCL 50 MG PO TABS: ORAL_TABLET | ORAL | 4 refills | Status: DC

## 2022-03-30 MED ORDER — LOSARTAN POTASSIUM 25 MG PO TABS: 25.0000 mg | ORAL_TABLET | Freq: Every day | ORAL | 4 refills | Status: DC

## 2022-03-30 NOTE — Assessment & Plan Note (Signed)
BMI 29.52.  Recommended eating smaller high protein, low fat meals more frequently and exercising 30 mins a day 5 times a week with a goal of 10-15lb weight loss in the next 3 months. Patient voiced their understanding and motivation to adhere to these recommendations.

## 2022-03-30 NOTE — Assessment & Plan Note (Signed)
Recheck CMP today.  Consider imaging if elevations continue.

## 2022-03-30 NOTE — Progress Notes (Signed)
BP 111/66   Pulse 67   Temp 98 F (36.7 C) (Oral)   Ht 5' 7.99" (1.727 m)   Wt 194 lb 1.6 oz (88 kg)   SpO2 99%   BMI 29.52 kg/m    Subjective:    Patient ID: Stefanie Stewart, female    DOB: [birth year], 47 y.o.   MRN: [id number]  HPI: Stefanie Stewart is a 47 y.o. female  Chief Complaint  Patient presents with   Hypertension   Diabetes   Hyperlipidemia   Mood   DIABETES Last A1c 5.2%.  Diagnosed in December 2021 when A1c was 11.3%, started on Metformin 1000 MG BID -- continues to take 500 MG daily.  She continues to work-out and working on diet, but is frustrated as is feeling stuck at current weight.  Feels this is hormone related. She has lost > 60 pounds, but feels stuck. Hypoglycemic episodes:no Polydipsia/polyuria: no Visual disturbance: no Chest pain: no Paresthesias: no Glucose Monitoring: yes  Accucheck frequency: Daily  Fasting glucose: averaging 100 range  Post prandial:  Evening:  Before meals: Taking Insulin?: no  Long acting insulin:  Short acting insulin: Blood Pressure Monitoring: not checking Retinal Examination: Up to Date Foot Exam: Up to Date done 03/02/22 with podiatry Pneumovax: Up To Date Influenza: Up To Date Aspirin: no   HYPERTENSION / HYPERLIPIDEMIA Taking Losartan 25 MG for BP and kidney protection.  Was taking Repatha in past, but wanted to come off.  Will recheck today and determine restart need.  Continues to vape, but has cut back.   Satisfied with current treatment? yes Duration of hypertension: chronic BP monitoring frequency: not checking BP range:  BP medication side effects: no Duration of hyperlipidemia: chronic Cholesterol medication side effects: no Cholesterol supplements: none Medication compliance: good compliance Aspirin: no Recent stressors: no Recurrent headaches: no Visual changes: no Palpitations: no Dyspnea: no Chest pain: no Lower extremity edema: no Dizzy/lightheaded: no  The 10-year ASCVD risk score  (Arnett DK, et al., 2019) is: 2.6%   Values used to calculate the score:     Age: 17 years     Sex: Female     Is Non-Hispanic African American: No     Diabetic: Yes     Tobacco smoker: No     Systolic Blood Pressure: 702 mmHg     Is BP treated: Yes     HDL Cholesterol: 29 mg/dL     Total Cholesterol: 131 mg/dL   DEPRESSION Continues on Sertraline 75 MG daily and Wellbutrin XL 150 MG daily.  She would like to see a menopause provider. Mood status: stable Satisfied with current treatment?: yes Symptom severity: mild  Duration of current treatment : chronic Side effects: no Medication compliance: good compliance Depressed mood: no Anxious mood: no Anhedonia: no Significant weight loss or gain: no Insomnia: none Fatigue: no Feelings of worthlessness or guilt: no Impaired concentration/indecisiveness: no Suicidal ideations: no Hopelessness: no Crying spells: no    03/30/2022    9:02 AM 09/22/2021    8:12 AM 03/24/2021    1:07 PM 10/07/2020    9:37 AM 05/09/2020   10:56 AM  Depression screen PHQ 2/9  Decreased Interest 0 1 0 0 0  Down, Depressed, Hopeless 0 0 0 0 0  PHQ - 2 Score 0 1 0 0 0  Altered sleeping 3 2 0 0 0  Tired, decreased energy 0 0 0 0 0  Change in appetite 1 0 0 0 0  Feeling bad  or failure about yourself  0 0 0 0 0  Trouble concentrating 0 0 0 0 0  Moving slowly or fidgety/restless 0 0 0 0 0  Suicidal thoughts 0 0 0 0 0  PHQ-9 Score 4 3 0 0 0  Difficult doing work/chores  Not difficult at all Not difficult at all Not difficult at all        03/30/2022    9:02 AM 09/22/2021    8:13 AM 07/31/2019   10:16 AM 04/06/2019    9:43 AM  GAD 7 : Generalized Anxiety Score  Nervous, Anxious, on Edge '3 3 1 3  '$ Control/stop worrying 2 3 0 1  Worry too much - different things 2 3 0 2  Trouble relaxing 2 3 0 3  Restless 1 0 0 0  Easily annoyed or irritable '2 1 3 3  '$ Afraid - awful might happen 1 1 0 1  Total GAD 7 Score '13 14 4 13  '$ Anxiety Difficulty  Somewhat  difficult Somewhat difficult    Relevant past medical, surgical, family and social history reviewed and updated as indicated. Interim medical history since our last visit reviewed. Allergies and medications reviewed and updated.  Review of Systems  Constitutional:  Negative for activity change, appetite change, diaphoresis, fatigue and fever.  Respiratory:  Negative for cough, chest tightness and shortness of breath.   Cardiovascular:  Negative for chest pain, palpitations and leg swelling.  Gastrointestinal: Negative.   Endocrine: Negative for polydipsia, polyphagia and polyuria.  Neurological: Negative.   Psychiatric/Behavioral: Negative.     Per HPI unless specifically indicated above     Objective:    BP 111/66   Pulse 67   Temp 98 F (36.7 C) (Oral)   Ht 5' 7.99" (1.727 m)   Wt 194 lb 1.6 oz (88 kg)   SpO2 99%   BMI 29.52 kg/m   Wt Readings from Last 3 Encounters:  03/30/22 194 lb 1.6 oz (88 kg)  10/14/21 192 lb 12.8 oz (87.5 kg)  09/28/21 194 lb 3.2 oz (88.1 kg)    Physical Exam Vitals and nursing note reviewed.  Constitutional:      General: She is awake. She is not in acute distress.    Appearance: She is well-developed and well-groomed. She is not ill-appearing or toxic-appearing.  HENT:     Head: Normocephalic.     Right Ear: Hearing, tympanic membrane, ear canal and external ear normal.     Left Ear: Hearing, tympanic membrane, ear canal and external ear normal.  Eyes:     General: Lids are normal.        Right eye: No discharge.        Left eye: No discharge.     Conjunctiva/sclera: Conjunctivae normal.     Pupils: Pupils are equal, round, and reactive to light.  Neck:     Thyroid: No thyromegaly.     Vascular: No carotid bruit.  Cardiovascular:     Rate and Rhythm: Normal rate and regular rhythm.     Heart sounds: Normal heart sounds. No murmur heard.    No gallop.  Pulmonary:     Effort: Pulmonary effort is normal. No accessory muscle usage or  respiratory distress.     Breath sounds: Normal breath sounds.  Abdominal:     General: Bowel sounds are normal.     Palpations: Abdomen is soft.  Musculoskeletal:     Right shoulder: Normal.     Left shoulder: No swelling, effusion, laceration,  tenderness, bony tenderness or crepitus. Normal range of motion. Normal strength.     Cervical back: Normal range of motion and neck supple.     Right lower leg: No edema.     Left lower leg: No edema.  Lymphadenopathy:     Cervical: No cervical adenopathy.  Skin:    General: Skin is warm and dry.  Neurological:     Mental Status: She is alert and oriented to person, place, and time.  Psychiatric:        Attention and Perception: Attention normal.        Mood and Affect: Mood normal.        Speech: Speech normal.        Behavior: Behavior normal. Behavior is cooperative.        Thought Content: Thought content normal.    Results for orders placed or performed in visit on 09/22/21  Bayer DCA Hb A1c Waived  Result Value Ref Range   HB A1C (BAYER DCA - WAIVED) 5.2 4.8 - 5.6 %  Lipid Panel w/o Chol/HDL Ratio  Result Value Ref Range   Cholesterol, Total 131 100 - 199 mg/dL   Triglycerides 87 0 - 149 mg/dL   HDL 29 (L) >39 mg/dL   VLDL Cholesterol Cal 17 5 - 40 mg/dL   LDL Chol Calc (NIH) 85 0 - 99 mg/dL  Comprehensive metabolic panel  Result Value Ref Range   Glucose 95 70 - 99 mg/dL   BUN 15 6 - 24 mg/dL   Creatinine, Ser 0.82 0.57 - 1.00 mg/dL   eGFR 89 >59 mL/min/1.73   BUN/Creatinine Ratio 18 9 - 23   Sodium 141 134 - 144 mmol/L   Potassium 4.6 3.5 - 5.2 mmol/L   Chloride 106 96 - 106 mmol/L   CO2 23 20 - 29 mmol/L   Calcium 9.3 8.7 - 10.2 mg/dL   Total Protein 7.3 6.0 - 8.5 g/dL   Albumin 4.6 3.8 - 4.8 g/dL   Globulin, Total 2.7 1.5 - 4.5 g/dL   Albumin/Globulin Ratio 1.7 1.2 - 2.2   Bilirubin Total 1.1 0.0 - 1.2 mg/dL   Alkaline Phosphatase 39 (L) 44 - 121 IU/L   AST 13 0 - 40 IU/L   ALT 17 0 - 32 IU/L  Microalbumin,  Urine Waived  Result Value Ref Range   Microalb, Ur Waived 10 0 - 19 mg/L   Creatinine, Urine Waived 300 10 - 300 mg/dL   Microalb/Creat Ratio <30 <30 mg/g      Assessment & Plan:   Problem List Items Addressed This Visit       Cardiovascular and Mediastinum   Hypertension associated with diabetes (HCC) (Chronic)    Chronic, stable .  BP well below goal in office.  Continue Losartan 25 MG daily for kidney protection and then adjust in future as needed -- may consider discontinuation in future.  Discussed benefit of ARB for kidney protection with proteinuria.  Recommend she monitor BP at least a few mornings a week at home and document.  DASH diet at home.  Labs: CBC, TSH, CMP, urine ALB. Return in 3 months.       Relevant Medications   Semaglutide,0.25 or 0.'5MG'$ /DOS, (OZEMPIC, 0.25 OR 0.5 MG/DOSE,) 2 MG/3ML SOPN   losartan (COZAAR) 25 MG tablet   Other Relevant Orders   Bayer DCA Hb A1c Waived   Microalbumin, Urine Waived   Comprehensive metabolic panel   CBC with Differential/Platelet   TSH  Endocrine   Hyperlipidemia associated with type 2 diabetes mellitus (HCC) (Chronic)    Chronic, ongoing.  History of poor tolerance statin. Off Repatha at this time and see how levels do without this since she has lost >60 pounds, if elevation in LDL >190 may need to restart.  Lipid panel today.  Return in 3 months.        Relevant Medications   Semaglutide,0.25 or 0.'5MG'$ /DOS, (OZEMPIC, 0.25 OR 0.5 MG/DOSE,) 2 MG/3ML SOPN   losartan (COZAAR) 25 MG tablet   Other Relevant Orders   Bayer DCA Hb A1c Waived   Comprehensive metabolic panel   Lipid Panel w/o Chol/HDL Ratio   Type 2 diabetes mellitus with proteinuria (HCC) - Primary (Chronic)    Diagnosed December 2021, last  A1c 5.3%, remains well controlled.  She has lost total of >60 pounds, but feels stuck and would like to lose more.  Urine ALB 80 last visit, recheck today.   - Stop Metformin and trial Ozempic to see if benefit to weight  loss and diabetes.  Educated her on this.  No family history of thyroid cancer (MTC, MEN 2, thyroid cell tumors) or pancreatitis. - Continue Losartan 25 MG daily for kidney protection.  - Educated on blood sugar goals, less then 130 fasting in morning and <180 two hours after a meal.   - Eye and foot exam up to date. - Return in 3 months.      Relevant Medications   Semaglutide,0.25 or 0.'5MG'$ /DOS, (OZEMPIC, 0.25 OR 0.5 MG/DOSE,) 2 MG/3ML SOPN   losartan (COZAAR) 25 MG tablet   Other Relevant Orders   Bayer DCA Hb A1c Waived   Microalbumin, Urine Waived     Other   Depression (Chronic)    Chronic, ongoing.  Continue current medication regimen and adjust as needed.  Denies SI/HI. Return to office in 3 months.      Relevant Medications   buPROPion (WELLBUTRIN XL) 150 MG 24 hr tablet   sertraline (ZOLOFT) 50 MG tablet   Other Relevant Orders   TSH   BMI 28.0-28.9,adult    BMI 29.52.  Recommended eating smaller high protein, low fat meals more frequently and exercising 30 mins a day 5 times a week with a goal of 10-15lb weight loss in the next 3 months. Patient voiced their understanding and motivation to adhere to these recommendations.       Elevated ALT measurement    Recheck CMP today.  Consider imaging if elevations continue.      Relevant Orders   Comprehensive metabolic panel   Myalgia due to statin    History of myalgia, even with 3 day a week Crestor.  Recheck levels today and restart Repatha as needed.      Relevant Orders   Comprehensive metabolic panel   Lipid Panel w/o Chol/HDL Ratio   Perimenopause    Referral to Dr. Murrell Redden to further discuss changes and treatment.  Is on BCP and not having cycles.  Needs pap, will discuss with GYN.      Relevant Orders   Ambulatory referral to Gynecology   Vapes nicotine containing substance    I have recommended complete cessation of tobacco use. I have discussed various options available for assistance with tobacco  cessation including over the counter methods (Nicotine gum, patch and lozenges). We also discussed prescription options (Chantix, Nicotine Inhaler / Nasal Spray). The patient is not interested in pursuing any prescription tobacco cessation options at this time.  Determine whether to start lung  CT screening annually at age 6.         Follow up plan: No follow-ups on file.

## 2022-03-30 NOTE — Assessment & Plan Note (Signed)
I have recommended complete cessation of tobacco use. I have discussed various options available for assistance with tobacco cessation including over the counter methods (Nicotine gum, patch and lozenges). We also discussed prescription options (Chantix, Nicotine Inhaler / Nasal Spray). The patient is not interested in pursuing any prescription tobacco cessation options at this time.  Determine whether to start lung CT screening annually at age 47.

## 2022-03-30 NOTE — Assessment & Plan Note (Signed)
Chronic, stable .  BP well below goal in office.  Continue Losartan 25 MG daily for kidney protection and then adjust in future as needed -- may consider discontinuation in future.  Discussed benefit of ARB for kidney protection with proteinuria.  Recommend she monitor BP at least a few mornings a week at home and document.  DASH diet at home.  Labs: CBC, TSH, CMP, urine ALB. Return in 3 months.

## 2022-03-30 NOTE — Assessment & Plan Note (Signed)
Diagnosed December 2021, last  A1c 5.3%, remains well controlled.  She has lost total of >60 pounds, but feels stuck and would like to lose more.  Urine ALB 80 last visit, recheck today.   - Stop Metformin and trial Ozempic to see if benefit to weight loss and diabetes.  Educated her on this.  No family history of thyroid cancer (MTC, MEN 2, thyroid cell tumors) or pancreatitis. - Continue Losartan 25 MG daily for kidney protection.  - Educated on blood sugar goals, less then 130 fasting in morning and <180 two hours after a meal.   - Eye and foot exam up to date. - Return in 3 months.

## 2022-03-30 NOTE — Assessment & Plan Note (Signed)
Chronic, ongoing.  History of poor tolerance statin. Off Repatha at this time and see how levels do without this since she has lost >60 pounds, if elevation in LDL >190 may need to restart.  Lipid panel today.  Return in 3 months.

## 2022-03-30 NOTE — Telephone Encounter (Signed)
PA started for Ozempic through [employer]. Awaiting on determination

## 2022-03-30 NOTE — Assessment & Plan Note (Signed)
History of myalgia, even with 3 day a week Crestor.  Recheck levels today and restart Repatha as needed.

## 2022-03-30 NOTE — Assessment & Plan Note (Signed)
Referral to Dr. Murrell Redden to further discuss changes and treatment.  Is on BCP and not having cycles.  Needs pap, will discuss with GYN.

## 2022-03-30 NOTE — Assessment & Plan Note (Signed)
Chronic, ongoing.  Continue current medication regimen and adjust as needed.  Denies SI/HI. Return to office in 3 months.

## 2022-03-31 ENCOUNTER — Encounter: Payer: Self-pay | Admitting: Nurse Practitioner

## 2022-03-31 LAB — CBC WITH DIFFERENTIAL/PLATELET
Basophils Absolute: 0 10*3/uL (ref 0.0–0.2)
Basos: 1 %
EOS (ABSOLUTE): 0.1 10*3/uL (ref 0.0–0.4)
Eos: 1 %
Hematocrit: 40.3 % (ref 34.0–46.6)
Hemoglobin: 13.3 g/dL (ref 11.1–15.9)
Immature Grans (Abs): 0 10*3/uL (ref 0.0–0.1)
Immature Granulocytes: 0 %
Lymphocytes Absolute: 1.5 10*3/uL (ref 0.7–3.1)
Lymphs: 26 %
MCH: 30.1 pg (ref 26.6–33.0)
MCHC: 33 g/dL (ref 31.5–35.7)
MCV: 91 fL (ref 79–97)
Monocytes Absolute: 0.4 10*3/uL (ref 0.1–0.9)
Monocytes: 8 %
Neutrophils Absolute: 3.5 10*3/uL (ref 1.4–7.0)
Neutrophils: 64 %
Platelets: 289 10*3/uL (ref 150–450)
RBC: 4.42 x10E6/uL (ref 3.77–5.28)
RDW: 11.8 % (ref 11.7–15.4)
WBC: 5.6 10*3/uL (ref 3.4–10.8)

## 2022-03-31 LAB — COMPREHENSIVE METABOLIC PANEL
ALT: 21 IU/L (ref 0–32)
AST: 13 IU/L (ref 0–40)
Albumin/Globulin Ratio: 2.2 (ref 1.2–2.2)
Albumin: 4.6 g/dL (ref 3.9–4.9)
Alkaline Phosphatase: 35 IU/L — ABNORMAL LOW (ref 44–121)
BUN/Creatinine Ratio: 21 (ref 9–23)
BUN: 15 mg/dL (ref 6–24)
Bilirubin Total: 1 mg/dL (ref 0.0–1.2)
CO2: 21 mmol/L (ref 20–29)
Calcium: 9.1 mg/dL (ref 8.7–10.2)
Chloride: 105 mmol/L (ref 96–106)
Creatinine, Ser: 0.72 mg/dL (ref 0.57–1.00)
Globulin, Total: 2.1 g/dL (ref 1.5–4.5)
Glucose: 99 mg/dL (ref 70–99)
Potassium: 4.4 mmol/L (ref 3.5–5.2)
Sodium: 140 mmol/L (ref 134–144)
Total Protein: 6.7 g/dL (ref 6.0–8.5)
eGFR: 103 mL/min/{1.73_m2} (ref >59)

## 2022-03-31 LAB — LIPID PANEL W/O CHOL/HDL RATIO
Cholesterol, Total: 211 mg/dL — ABNORMAL HIGH (ref 100–199)
HDL: 30 mg/dL — ABNORMAL LOW (ref >39)
LDL Chol Calc (NIH): 165 mg/dL — ABNORMAL HIGH (ref 0–99)
Triglycerides: 87 mg/dL (ref 0–149)
VLDL Cholesterol Cal: 16 mg/dL (ref 5–40)

## 2022-03-31 LAB — TSH: TSH: 1.63 u[IU]/mL (ref 0.450–4.500)

## 2022-03-31 NOTE — Progress Notes (Signed)
Contacted via [place] morning Stefanie Stewart, your labs have returned and overall look great with exception of cholesterol which has trended up without medication.  I recommend we restart Repatha.  Would you be okay with this?  Let me know and I will send in.  Any questions? Keep being amazing!!  Thank you for allowing me to participate in your care.  I appreciate you. Kindest regards, Sharronda Schweers

## 2022-04-02 MED ORDER — REPATHA 140 MG/ML ~~LOC~~ SOSY: 140.0000 mg | PREFILLED_SYRINGE | SUBCUTANEOUS | 6 refills | Status: DC

## 2022-04-13 ENCOUNTER — Telehealth: Payer: Self-pay

## 2022-04-13 NOTE — Telephone Encounter (Signed)
Paperwork faxed back to [employer]

## 2022-04-16 ENCOUNTER — Telehealth: Payer: Self-pay

## 2022-04-16 NOTE — Telephone Encounter (Signed)
PA for Repatha has been approved from 04/05/22 through 04/04/23

## 2022-04-16 NOTE — Telephone Encounter (Signed)
PA started for Repatha through [employer]. Awaiting on determination

## 2022-05-04 ENCOUNTER — Telehealth: Payer: Self-pay | Admitting: Nurse Practitioner

## 2022-05-04 ENCOUNTER — Encounter: Payer: Self-pay | Admitting: Nurse Practitioner

## 2022-05-04 NOTE — Telephone Encounter (Signed)
Patient made aware of Provider's recommendations and verbalized understanding.

## 2022-05-04 NOTE — Telephone Encounter (Signed)
Copied from [place] [id number]. Topic: General - Other >> May 04, 2022  9:11 AM [place] B wrote: Reason for CRM: Patient called in says is on [place] and it has helped and she wants to know if she can stop the metformin. She doesn't want the diet to cause her bs to get to low.

## 2022-05-09 ENCOUNTER — Other Ambulatory Visit: Payer: Self-pay | Admitting: Nurse Practitioner

## 2022-05-09 NOTE — Telephone Encounter (Signed)
.  rxpro

## 2022-05-11 ENCOUNTER — Ambulatory Visit: Payer: Self-pay | Admitting: *Deleted

## 2022-05-11 ENCOUNTER — Ambulatory Visit (INDEPENDENT_AMBULATORY_CARE_PROVIDER_SITE_OTHER): Payer: BC Managed Care – PPO | Admitting: Psychiatry

## 2022-05-11 DIAGNOSIS — E119 Type 2 diabetes mellitus without complications: Secondary | ICD-10-CM | POA: Diagnosis not present

## 2022-05-11 DIAGNOSIS — Z566 Other physical and mental strain related to work: Secondary | ICD-10-CM

## 2022-05-11 DIAGNOSIS — N951 Menopausal and female climacteric states: Secondary | ICD-10-CM

## 2022-05-11 DIAGNOSIS — F325 Major depressive disorder, single episode, in full remission: Secondary | ICD-10-CM | POA: Diagnosis not present

## 2022-05-11 DIAGNOSIS — Z638 Other specified problems related to primary support group: Secondary | ICD-10-CM

## 2022-05-11 DIAGNOSIS — F411 Generalized anxiety disorder: Secondary | ICD-10-CM | POA: Diagnosis not present

## 2022-05-11 MED ORDER — NORETHINDRONE ACETATE 5 MG PO TABS: 5.0000 mg | ORAL_TABLET | Freq: Every day | ORAL | 4 refills | Status: DC

## 2022-05-11 NOTE — Addendum Note (Signed)
Addended by: Marnee Guarneri T on: 05/11/2022 05:04 PM   Modules accepted: Orders

## 2022-05-11 NOTE — Telephone Encounter (Signed)
Reason for Disposition  [1] Caller has URGENT medicine question about med that PCP or specialist prescribed AND [2] triager unable to answer question  Answer Assessment - Initial Assessment Questions 1. NAME of MEDICINE: "What medicine(s) are you calling about?"     Aygestin 5 mg Norethindrone 2. QUESTION: "What is your question?" (e.g., double dose of medicine, side effect)     Pt at pharmacy.   They said the rx is written for 2023 instead of 2024 and they can't fill it.     Can Stefanie Stewart resent the rx for 2024 3. PRESCRIBER: "Who prescribed the medicine?" Reason: if prescribed by specialist, call should be referred to that group.     Stefanie Guarneri, NP 4. SYMPTOMS: "Do you have any symptoms?" If Yes, ask: "What symptoms are you having?"  "How bad are the symptoms (e.g., mild, moderate, severe)     N/A 5. PREGNANCY:  "Is there any chance that you are pregnant?" "When was your last menstrual period?"     N/A  Protocols used: Medication Question Call-A-AH

## 2022-05-11 NOTE — Telephone Encounter (Signed)
  Chief Complaint: Pt at pharmacy.   Aygestin 5 mg is not at the pharmacy.   It's written for 2023 instead of 2024.   Can Marnee Guarneri, NP resend it for 2024. Symptoms: Been off of it for 2 weeks Frequency: N/A Pertinent Negatives: Patient denies N/A Disposition: []$ ED /[]$ Urgent Care (no appt availability in office) / []$ Appointment(In office/virtual)/ []$  [facility]/ []$ Home Care/ []$ Refused Recommended Disposition /[]$ [facility]/ [x]$  Follow-up with PCP Additional Notes: Please have Marnee Guarneri, NP send in a new rx for 2024 for the Aygestin 5 mg.

## 2022-05-11 NOTE — Progress Notes (Signed)
Psychotherapy Progress Note [facility] [name], PhD LP  Patient ID: Stefanie Stewart)    MRN: [id number] Therapy format: Individual psychotherapy Date: 05/11/2022      Start: 11:11a     Stop: 12:01p     Time Spent: 50 min Location: In-person   Session narrative (presenting needs, interim history, self-report of stressors and symptoms, applications of prior therapy, status changes, and interventions made in session) Started Optavia diet plan 3 wks ago, having hit a standstill with weight loss after 55 lbs.  Seems to be working fast -- 15 more lbs from 196 to 181 in 3 weeks.  Glad to be off Metformin.  Affirmed and encouraged.  Work is amping up anxiety, between high load and a case where a patient swallowed part of a crown and the dentist seemed paralyzed by it for a few minutes.  She has upped her sertraline, but anxiety remains.  Turns out she's only on 75mg  right now, plus 150mg  Wellbutrin XL.  Suggested she can go up further, provided she verifies with prescriber.    Friendships still iffy, still leery of counting on people.  One, Altha Harm, suddenly dealing with end of a marriage, finding out she's been cheated on, needy.  Still feels at risk for being overly drawn into other's problems and compulsive caretaking.  Suggested she can still listen, offer a little comfort, and say with authority what she can and can't do, ask questions to clarify the needy one's wises.  Re. estate issue Buyer, retail) in [place], cousin [place] in [place] is now handling details, while atty's office is hired Therapist, sports for legal purposes.  Struggling with whether to trust him, as he has the high-value electronics and access to personal information, and she would like to have the computer to use, actually.  Recommended she just ask, not worry.  He sounds kind and responsible, not conniving.  Re. other anxiety, focused on tax season and trying to get her burden settled, move on  with.  Feels like she needs a more effective way to key down.  Reiki therapist told her she needs to learn to meditate, that there's something "blocked" in her, and she needs to lie down and try to block out all those thoughts that crowd in.  Supportively confronted the paradox of "trying" to "block out" something as involuntary as thoughts, and reframed the idea as to allow them to come through, don't try to block them, but don't try to pick them up either, watch them pass through like leaves on a stream, and when ready pay attention to something else.  Introduced mindful minute -- just naming what you hear, for a 60 second commitment -- and found she could key down and really "slow the hamster wheel".  Resolved to try further on her own, maybe listen for a clock ticking, or air vents running.  If she likes, she could download a metronome app on her phone, listen to the beat, and explore how it feels to turn the tempo up and down.  Therapeutic modalities: Cognitive Behavioral Therapy, Solution-Oriented/Positive Psychology, and Ego-Supportive  Mental Status/Observations:  Appearance:   Casual     Behavior:  Appropriate  Motor:  Normal  Speech/Language:   Clear and Coherent  Affect:  Appropriate  Mood:  anxious  Thought process:  normal  Thought content:    WNL  Sensory/Perceptual disturbances:    WNL  Orientation:  Fully oriented  Attention:  Good    Concentration:  Fair  Memory:  WNL  Insight:    Good  Judgment:   Good  Impulse Control:  Fair   Risk Assessment: Danger to Self: No Self-injurious Behavior: No Danger to Others: No Physical Aggression / Violence: No Duty to Warn: No Access to Firearms a concern: No  Assessment of progress:  progressing  Diagnosis:   ICD-10-CM   1. Generalized anxiety disorder  F41.1     2. Type 2 diabetes mellitus without complication, without long-term current use of insulin (HCC)  E11.9     3. 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 Relationship problem with family member  Z63.8     5. Work stress  Z56.6     6. Perimenopausal  N95.1      Plan:  Work through tasks of estate business with relatives, trust until proven otherwise, but retain the right to decide for herself how to conduct things Continue good diabetes care with diet & exercise Continue integrating into the new work environment Continue communication advice with intrusive or otherwise pressuring family/friends Self-soothing techniques -- mindful minute, listen to ambient noise (clock, air vent), try listening to a metronome app and playing with tempo Other recommendations/advice as may be noted above Continue to utilize previously learned skills ad lib Maintain medication as prescribed and work faithfully with relevant prescriber(s) if any changes are desired or seem indicated Call the clinic on-call service, 988/hotline, 911, or present to [facility] or ER if any life-threatening psychiatric crisis Return for time as available. Already scheduled visit in this office 06/01/2022.  Blanchie Serve, PhD [name], PhD LP Clinical Psychologist, [facility] [address], [place], [place] [address] [phone]

## 2022-05-18 ENCOUNTER — Encounter: Payer: Self-pay | Admitting: Nurse Practitioner

## 2022-05-18 DIAGNOSIS — E119 Type 2 diabetes mellitus without complications: Secondary | ICD-10-CM | POA: Diagnosis not present

## 2022-05-18 LAB — HM DIABETES EYE EXAM

## 2022-05-25 DIAGNOSIS — N912 Amenorrhea, unspecified: Secondary | ICD-10-CM | POA: Diagnosis not present

## 2022-05-25 DIAGNOSIS — N951 Menopausal and female climacteric states: Secondary | ICD-10-CM | POA: Diagnosis not present

## 2022-06-01 ENCOUNTER — Ambulatory Visit (INDEPENDENT_AMBULATORY_CARE_PROVIDER_SITE_OTHER): Payer: BC Managed Care – PPO | Admitting: Psychiatry

## 2022-06-01 DIAGNOSIS — Z566 Other physical and mental strain related to work: Secondary | ICD-10-CM

## 2022-06-01 DIAGNOSIS — F325 Major depressive disorder, single episode, in full remission: Secondary | ICD-10-CM

## 2022-06-01 DIAGNOSIS — F411 Generalized anxiety disorder: Secondary | ICD-10-CM | POA: Diagnosis not present

## 2022-06-01 DIAGNOSIS — E119 Type 2 diabetes mellitus without complications: Secondary | ICD-10-CM | POA: Diagnosis not present

## 2022-06-01 DIAGNOSIS — N951 Menopausal and female climacteric states: Secondary | ICD-10-CM | POA: Diagnosis not present

## 2022-06-01 NOTE — Progress Notes (Signed)
Psychotherapy Progress Note [facility] [name], PhD LP  Patient ID: Stefanie Stewart)    MRN: [id number] Therapy format: Individual psychotherapy Date: 06/01/2022      Start: 10:12a     Stop: 11:02a     Time Spent: 50 min Location: In-person   Session narrative (presenting needs, interim history, self-report of stressors and symptoms, applications of prior therapy, status changes, and interventions made in session) Last session focused on rapid weight loss, the value of raising Zoloft dosage (carefully, with Wellbutrin also on board), ongoing risk of feeling too drawn into other people's drama, getting through the inheritance/estate issue with [place] family and tax season, and the value of mindful listening as a way to key down.  Mindful minute is coming in handy.  Making it easy to fall asleep.  Optavia plan is working well, and fast, meaning she is quickly facing need for new wardrobe.  Tax burden smaller than expected and largely saved for it already.  Has engaged a gynecologist about working with perimenopause.  Went off her BCP after several years (orig for controlling endometriosis).  Off metformin now, also with such good diet control.  Another unexpected bonus coming, feels it's almost too good to be true.    Resisting friends' urging to date again, enjoying her freedom.  One friend Margreta Journey still being too needy, frequent calls to complain about her ex or worry.   May want to go see a show downtown, but anxious about whether she'd be attacked walking from the parking garage.  Evaluated the last one, showed how it is unrealistic to expect being mugged under the conditions, since events like she is thinking of tend to mean many people arriving and walking at once, and muggers don't pick people out of packs.  Encouraged to reimagine her fear scenario with that in mind, try to see deterrents to predatory crime from the predator's perspective.  Therapeutic  modalities: Cognitive Behavioral Therapy, Solution-Oriented/Positive Psychology, and Ego-Supportive  Mental Status/Observations:  Appearance:   Casual     Behavior:  Appropriate  Motor:  Normal  Speech/Language:   Clear and Coherent  Affect:  Appropriate  Mood:  anxious and better  Thought process:  intact  Thought content:    Worry thoughts  Sensory/Perceptual disturbances:    WNL  Orientation:  Fully oriented  Attention:  Good    Concentration:  Fair  Memory:  WNL  Insight:    Good  Judgment:   Good  Impulse Control:  Fair   Risk Assessment: Danger to Self: No Self-injurious Behavior: No Danger to Others: No Physical Aggression / Violence: No Duty to Warn: No Access to Firearms a concern: No  Assessment of progress:  progressing  Diagnosis:   ICD-10-CM   1. Generalized anxiety disorder  F41.1     2. Type 2 diabetes mellitus without complication, without long-term current use of insulin (HCC)  E11.9     3. Major depressive disorder, single episode, in remission ([place])  F32.5     4. Perimenopausal  N95.1     5. 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, trust until proven otherwise, but retain the right to decide for herself how to conduct things Continue good diabetes care with diet & exercise Continue integrating into the new work environment Continue communication advice with intrusive or otherwise pressuring family/friends Self-soothing techniques -- mindful minute, listen to ambient noise (clock, air vent),  try listening to a metronome app and playing with tempo For safety fear scenarios, try to see deterrents to predatory crime from the predator's perspective Other recommendations/advice as may be noted above Continue to utilize previously learned skills ad lib Maintain medication as prescribed and work faithfully with relevant prescriber(s) if any changes are desired or seem indicated Call the clinic on-call service,  988/hotline, 911, or present to [facility] or ER if any life-threatening psychiatric crisis Return for time at discretion. Already scheduled visit in this office 06/29/2022.  Blanchie Serve, PhD [name], PhD LP Clinical Psychologist, [facility] [address] [place], [place] [address] [phone]

## 2022-06-29 ENCOUNTER — Ambulatory Visit: Payer: BC Managed Care – PPO | Admitting: Psychiatry

## 2022-06-29 ENCOUNTER — Encounter: Payer: BC Managed Care – PPO | Admitting: Nurse Practitioner

## 2022-06-29 DIAGNOSIS — F325 Major depressive disorder, single episode, in full remission: Secondary | ICD-10-CM | POA: Diagnosis not present

## 2022-06-29 DIAGNOSIS — Z638 Other specified problems related to primary support group: Secondary | ICD-10-CM

## 2022-06-29 DIAGNOSIS — Z566 Other physical and mental strain related to work: Secondary | ICD-10-CM | POA: Diagnosis not present

## 2022-06-29 DIAGNOSIS — F411 Generalized anxiety disorder: Secondary | ICD-10-CM

## 2022-06-29 DIAGNOSIS — E119 Type 2 diabetes mellitus without complications: Secondary | ICD-10-CM | POA: Diagnosis not present

## 2022-06-29 DIAGNOSIS — N951 Menopausal and female climacteric states: Secondary | ICD-10-CM

## 2022-06-29 NOTE — Progress Notes (Signed)
Psychotherapy Progress Note [facility] Marliss Czar, PhD LP  Patient ID: Stefanie Stewart)    MRN: [id number] Therapy format: Individual psychotherapy Date: 06/29/2022      Start: 10:11a     Stop: 11:00a     Time Spent: 49 min Location: In-person   Session narrative (presenting needs, interim history, self-report of stressors and symptoms, applications of prior therapy, status changes, and interventions made in session) Frustrated with eclipse glasses she got being revealed as not [employer] approved.  Got invited to a housewarming next Sat, doesn't want to go; assured it's OK to have her own plans.  Getting a lot of indications from a couple of senior coworkers that she overdoes it sharing (e.g., about her son) and speaking her worries or opinions (e.g., bees outside, issues with the dentist's reactions).  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  Challenged about possibly mindreading and projecting.  Letting friend Carollee Herter take her own time to call, and she did last week, not force things.  86# lost in 2 years now, and getting compliments.  Figures to lose a few more before going to maintenance phase of the [place] program.  Some difficulty with son Colton telling embellished stories on her.    Affirmed and encouraged working through new workplace relationships, clarity with her supervisor, and finding more peace among coworkers.  Discussed options for responding to Colton, including letting him know she's heard some things back and asking if that's the way they came out, whether they are his actual memory/understanding.  Therapeutic modalities: Cognitive Behavioral Therapy, Solution-Oriented/Positive Psychology, Ego-Supportive, and  Assertiveness/Communication  Mental Status/Observations:  Appearance:   Casual     Behavior:  Appropriate and restless  Motor:  Normal  Speech/Language:   Clear and Coherent  Affect:  Appropriate  Mood:  anxious  Thought process:  normal  Thought content:    WNL  Sensory/Perceptual disturbances:    WNL  Orientation:  Fully oriented  Attention:  Good    Concentration:  Fair  Memory:  WNL  Insight:    Good  Judgment:   Good  Impulse Control:  Good   Risk Assessment: Danger to Self: No Self-injurious Behavior: No Danger to Others: No Physical Aggression / Violence: No Duty to Warn: No Access to Firearms a concern: No  Assessment of progress:  progressing  Diagnosis:   ICD-10-CM   1. Generalized anxiety disorder  F41.1     2. Type 2 diabetes mellitus without complication, without long-term current use of insulin (HCC)  E11.9     3. 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 Work stress  Z56.6     5. Perimenopausal  N95.1     6. 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  Try to worry less what people think of her and bring her own peace of mind to interactions Family dynamics -- Continue communication advice with intrusive  or otherwise pressuring family/friends Self-soothing techniques -- mindful minute, listen to ambient noise (clock, air vent), try listening to a metronome app and playing with tempo Worry/catastrophizing -- For safety fear scenarios, try to see deterrents to predatory crime from the predator's perspective Other recommendations/advice as may be noted above Continue to utilize previously learned skills ad lib Maintain  medication as prescribed and work faithfully with relevant prescriber(s) if any changes are desired or seem indicated Call the clinic on-call service, 988/hotline, 911, or present to [facility] or ER if any life-threatening psychiatric crisis No follow-ups on file. Already scheduled visit in this office 08/17/2022.  Robley Fries, PhD Marliss Czar, PhD LP Clinical Psychologist, [facility] [address], [address] [place], [place] [address] [phone]

## 2022-07-20 ENCOUNTER — Other Ambulatory Visit: Payer: Self-pay | Admitting: Nurse Practitioner

## 2022-07-20 DIAGNOSIS — Z1231 Encounter for screening mammogram for malignant neoplasm of breast: Secondary | ICD-10-CM

## 2022-07-25 NOTE — Patient Instructions (Signed)
Be Involved in Your Health Care:  Taking Medications When medications are taken as directed, they can greatly improve your health. But if they are not taken as instructed, they may not work. In some cases, not taking them correctly can be harmful. To help ensure your treatment remains effective and safe, understand your medications and how to take them.  Your lab results, notes and after visit summary will be available on My Chart. We strongly encourage you to use this feature. If lab results are abnormal the clinic will contact you with the appropriate steps. If the clinic does not contact you assume the results are satisfactory. You can always see your results on My Chart. If you have questions regarding your condition, please contact the clinic during office hours. You can also ask questions on My Chart.  We at [facility] are grateful that you chose us to provide care. We strive to provide excellent and compassionate care and are always looking for feedback. If you get a survey from the clinic please complete this.   Diabetes Mellitus Basics  Diabetes mellitus, or diabetes, is a long-term (chronic) disease. It occurs when the body does not properly use sugar (glucose) that is released from food after you eat. Diabetes mellitus may be caused by one or both of these problems: Your pancreas does not make enough of a hormone called insulin. Your body does not react in a normal way to the insulin that it makes. Insulin lets glucose enter cells in your body. This gives you energy. If you have diabetes, glucose cannot get into cells. This causes high blood glucose (hyperglycemia). How to treat and manage diabetes You may need to take insulin or other diabetes medicines daily to keep your glucose in balance. If you are prescribed insulin, you will learn how to give yourself insulin by injection. You may need to adjust the amount of insulin you take based on the foods that you eat. You will  need to check your blood glucose levels using a glucose monitor as told by your health care provider. The readings can help determine if you have low or high blood glucose. Generally, you should have these blood glucose levels: Before meals (preprandial): 80-130 mg/dL (4.4-7.2 mmol/L). After meals (postprandial): below 180 mg/dL (10 mmol/L). Hemoglobin A1c (HbA1c) level: less than 7%. Your health care provider will set treatment goals for you. Keep all follow-up visits. This is important. Follow these instructions at home: Diabetes medicines Take your diabetes medicines every day as told by your health care provider. List your diabetes medicines here: Name of medicine: ______________________________ Amount (dose): _______________ Time (a.m./p.m.): _______________ Notes: ___________________________________ Name of medicine: ______________________________ Amount (dose): _______________ Time (a.m./p.m.): _______________ Notes: ___________________________________ Name of medicine: ______________________________ Amount (dose): _______________ Time (a.m./p.m.): _______________ Notes: ___________________________________ Insulin If you use insulin, list the types of insulin you use here: Insulin type: ______________________________ Amount (dose): _______________ Time (a.m./p.m.): _______________Notes: ___________________________________ Insulin type: ______________________________ Amount (dose): _______________ Time (a.m./p.m.): _______________ Notes: ___________________________________ Insulin type: ______________________________ Amount (dose): _______________ Time (a.m./p.m.): _______________ Notes: ___________________________________ Insulin type: ______________________________ Amount (dose): _______________ Time (a.m./p.m.): _______________ Notes: ___________________________________ Insulin type: ______________________________ Amount (dose): _______________ Time (a.m./p.m.): _______________  Notes: ___________________________________ Managing blood glucose  Check your blood glucose levels using a glucose monitor as told by your health care provider. Write down the times that you check your glucose levels here: Time: _______________ Notes: ___________________________________ Time: _______________ Notes: ___________________________________ Time: _______________ Notes: ___________________________________ Time: _______________ Notes: ___________________________________ Time: _______________ Notes: ___________________________________ Time: _______________ Notes: ___________________________________    Low blood glucose Low blood glucose (hypoglycemia) is when glucose is at or below 70 mg/dL (3.9 mmol/L). Symptoms may include: Feeling: Hungry. Sweaty and clammy. Irritable or easily upset. Dizzy. Sleepy. Having: A fast heartbeat. A headache. A change in your vision. Numbness around the mouth, lips, or tongue. Having trouble with: Moving (coordination). Sleeping. Treating low blood glucose To treat low blood glucose, eat or drink something containing sugar right away. If you can think clearly and swallow safely, follow the 15:15 rule: Take 15 grams of a fast-acting carb (carbohydrate), as told by your health care provider. Some fast-acting carbs are: Glucose tablets: take 3-4 tablets. Hard candy: eat 3-5 pieces. Fruit juice: drink 4 oz (120 mL). Regular (not diet) soda: drink 4-6 oz (120-180 mL). Honey or sugar: eat 1 Tbsp (15 mL). Check your blood glucose levels 15 minutes after you take the carb. If your glucose is still at or below 70 mg/dL (3.9 mmol/L), take 15 grams of a carb again. If your glucose does not go above 70 mg/dL (3.9 mmol/L) after 3 tries, get help right away. After your glucose goes back to normal, eat a meal or a snack within 1 hour. Treating very low blood glucose If your glucose is at or below 54 mg/dL (3 mmol/L), you have very low blood glucose  (severe hypoglycemia). This is an emergency. Do not wait to see if the symptoms will go away. Get medical help right away. Call your local emergency services (911 in the [place] Do not drive yourself to the hospital. Questions to ask your health care provider Should I talk with a diabetes educator? What equipment will I need to care for myself at home? What diabetes medicines do I need? When should I take them? How often do I need to check my blood glucose levels? What number can I call if I have questions? When is my follow-up visit? Where can I find a support group for people with diabetes? Where to find more information American Diabetes Association: www.diabetes.org Association of Diabetes Care and Education Specialists: www.diabeteseducator.org Contact a health care provider if: Your blood glucose is at or above 240 mg/dL (13.3 mmol/L) for 2 days in a row. You have been sick or have had a fever for 2 days or more, and you are not getting better. You have any of these problems for more than 6 hours: You cannot eat or drink. You feel nauseous. You vomit. You have diarrhea. Get help right away if: Your blood glucose is lower than 54 mg/dL (3 mmol/L). You get confused. You have trouble thinking clearly. You have trouble breathing. These symptoms may represent a serious problem that is an emergency. Do not wait to see if the symptoms will go away. Get medical help right away. Call your local emergency services (911 in the [place] Do not drive yourself to the hospital. Summary Diabetes mellitus is a chronic disease that occurs when the body does not properly use sugar (glucose) that is released from food after you eat. Take insulin and diabetes medicines as told. Check your blood glucose every day, as often as told. Keep all follow-up visits. This is important. This information is not intended to replace advice given to you by your health care provider. Make sure you discuss any  questions you have with your health care provider. Document Revised: 07/14/2019 Document Reviewed: 07/14/2019 Elsevier Patient Education  2023 Elsevier Inc.  

## 2022-07-27 ENCOUNTER — Ambulatory Visit (INDEPENDENT_AMBULATORY_CARE_PROVIDER_SITE_OTHER): Payer: BC Managed Care – PPO | Admitting: Nurse Practitioner

## 2022-07-27 ENCOUNTER — Encounter: Payer: Self-pay | Admitting: Nurse Practitioner

## 2022-07-27 VITALS — BP 101/75 | HR 71 | Temp 97.6°F | Ht 67.99 in | Wt 155.1 lb

## 2022-07-27 DIAGNOSIS — E1159 Type 2 diabetes mellitus with other circulatory complications: Secondary | ICD-10-CM | POA: Diagnosis not present

## 2022-07-27 DIAGNOSIS — E1169 Type 2 diabetes mellitus with other specified complication: Secondary | ICD-10-CM | POA: Diagnosis not present

## 2022-07-27 DIAGNOSIS — R7401 Elevation of levels of liver transaminase levels: Secondary | ICD-10-CM

## 2022-07-27 DIAGNOSIS — I152 Hypertension secondary to endocrine disorders: Secondary | ICD-10-CM | POA: Diagnosis not present

## 2022-07-27 DIAGNOSIS — Z6828 Body mass index (BMI) 28.0-28.9, adult: Secondary | ICD-10-CM

## 2022-07-27 DIAGNOSIS — Z0000 Encounter for general adult medical examination without abnormal findings: Secondary | ICD-10-CM | POA: Diagnosis not present

## 2022-07-27 DIAGNOSIS — E1129 Type 2 diabetes mellitus with other diabetic kidney complication: Secondary | ICD-10-CM | POA: Diagnosis not present

## 2022-07-27 DIAGNOSIS — N951 Menopausal and female climacteric states: Secondary | ICD-10-CM

## 2022-07-27 DIAGNOSIS — M25552 Pain in left hip: Secondary | ICD-10-CM | POA: Insufficient documentation

## 2022-07-27 DIAGNOSIS — E785 Hyperlipidemia, unspecified: Secondary | ICD-10-CM | POA: Diagnosis not present

## 2022-07-27 DIAGNOSIS — R809 Proteinuria, unspecified: Secondary | ICD-10-CM

## 2022-07-27 DIAGNOSIS — Z124 Encounter for screening for malignant neoplasm of cervix: Secondary | ICD-10-CM

## 2022-07-27 DIAGNOSIS — F320 Major depressive disorder, single episode, mild: Secondary | ICD-10-CM

## 2022-07-27 DIAGNOSIS — N809 Endometriosis, unspecified: Secondary | ICD-10-CM

## 2022-07-27 DIAGNOSIS — M791 Myalgia, unspecified site: Secondary | ICD-10-CM

## 2022-07-27 DIAGNOSIS — Z720 Tobacco use: Secondary | ICD-10-CM

## 2022-07-27 LAB — BAYER DCA HB A1C WAIVED: HB A1C (BAYER DCA - WAIVED): 5.4 % (ref 4.8–5.6)

## 2022-07-27 MED ORDER — SERTRALINE HCL 50 MG PO TABS: 100.0000 mg | ORAL_TABLET | Freq: Every day | ORAL | 4 refills | Status: DC

## 2022-07-27 NOTE — Assessment & Plan Note (Signed)
Acute for over one week, suspect muscle pull.  Will place referral to PT and recommend continue at home regimen for care.  Avoid any heavy exercise to hip area until improved.

## 2022-07-27 NOTE — Assessment & Plan Note (Signed)
I have recommended complete cessation of tobacco use. I have discussed various options available for assistance with tobacco cessation including over the counter methods (Nicotine gum, patch and lozenges). We also discussed prescription options (Chantix, Nicotine Inhaler / Nasal Spray). The patient is not interested in pursuing any prescription tobacco cessation options at this time.  Determine whether to start lung CT screening annually at age 48.  

## 2022-07-27 NOTE — Assessment & Plan Note (Addendum)
Diagnosed December 2021, last  A1c 5.3% last visit, remains well controlled.  She has lost total of >90 pounds, recheck A1c today.  Urine ALB 14 April 2022.   - Continue diet focus and exercise. - Stop Losartan due to orthostatic hypotension episodes with her weight loss. - Educated on blood sugar goals, less then 130 fasting in morning and <180 two hours after a meal.   - Eye and foot exam up to date. - Referral for skin exam. - Return in 3 months.

## 2022-07-27 NOTE — Assessment & Plan Note (Signed)
History of myalgia, even with 3 day a week Crestor.  Continue Repatha and recheck levels today.

## 2022-07-27 NOTE — Assessment & Plan Note (Signed)
Recheck CMP today.  Consider imaging if elevations continue. 

## 2022-07-27 NOTE — Assessment & Plan Note (Signed)
Continue collaboration with Dr. Amado Nash to further discuss changes and treatment.  Is on BCP and not having cycles.  Needs pap, will discuss with GYN.

## 2022-07-27 NOTE — Assessment & Plan Note (Signed)
Chronic, ongoing.  Continue current medication regimen and adjust as needed, will increase her Sertraline to 100 MG daily which she has been taking.  Denies SI/HI. Return to office in 3 months.

## 2022-07-27 NOTE — Progress Notes (Signed)
BP 101/75   Pulse 71   Temp 97.6 F (36.4 C) (Oral)   Ht 5' 7.99" (1.727 m)   Wt 155 lb 1.6 oz (70.4 kg)   SpO2 99%   BMI 23.59 kg/m    Subjective:    Patient ID: Stefanie Stewart, female    DOB: 1975-04-01, 47 y.o.   MRN: [id number]  HPI: Stefanie Stewart is a 47 y.o. female presenting on 07/27/2022 for comprehensive medical examination. Current medical complaints include:none  She currently lives with: self Menopausal Symptoms: no  DIABETES A1c 5.3% January. Diagnosed in December 2021 when A1c was 11.3%, started on Metformin 1000 MG BID. Currently she is diet controlled.  She continues to work-out and working on diet.  She has lost > 90 pounds. Hypoglycemic episodes:no Polydipsia/polyuria: no Visual disturbance: no Chest pain: no Paresthesias: no Glucose Monitoring: yes  Accucheck frequency: Daily  Fasting glucose: averaging 80 range  Post prandial:  Evening:  Before meals: Taking Insulin?: no  Long acting insulin:  Short acting insulin: Blood Pressure Monitoring: not checking Retinal Examination: Up to Date Foot Exam: Up to Date  Pneumovax: Up To Date Influenza: Up To Date Aspirin: no   HYPERTENSION / HYPERLIPIDEMIA Taking Losartan 25 MG for BP and kidney protection.  Was taking Repatha in past, but wanted to come off but levels increased.  Continues to vape, but not as often.  Has been having bad vertigo when gets up in morning over past month. Satisfied with current treatment? yes Duration of hypertension: chronic BP monitoring frequency: not checking BP range:  BP medication side effects: no Duration of hyperlipidemia: chronic Cholesterol medication side effects: no Cholesterol supplements: none Medication compliance: good compliance Aspirin: no Recent stressors: no Recurrent headaches: no Visual changes: no Palpitations: no Dyspnea: no Chest pain: no Lower extremity edema: no Dizzy/lightheaded: no  The 10-year ASCVD risk score (Arnett DK, et al.,  2019) is: 9.4%   Values used to calculate the score:     Age: 18 years     Sex: Female     Is Non-Hispanic African American: No     Diabetic: Yes     Tobacco smoker: Yes     Systolic Blood Pressure: 101 mmHg     Is BP treated: No     HDL Cholesterol: 30 mg/dL     Total Cholesterol: 211 mg/dL  HIP PAIN Last Friday started to have hip pain left side after working out, felt something pop.  Continues to have discomfort to area.   Duration: days Involved hip: left  Mechanism of injury:  working out Location: diffuse Onset: sudden  Severity: 5/10  Quality: dull and aching Frequency: intermittent Radiation: yes done leg a little Aggravating factors: weight bearing and prolonged sitting   Alleviating factors: stretching, heat, ice  Status: stable Treatments attempted: ice packs, heating pad, roller, Icy/Hot, stretching, resting   Relief with NSAIDs?: moderate Weakness with weight bearing:  a little in hip area Weakness with walking: no Paresthesias / decreased sensation: no Swelling: no Redness:no Fevers: no   DEPRESSION Continues on Sertraline 100 MG daily and Wellbutrin XL 150 MG daily.  She is followed with Dr. Amado Nash for GYN -- getting pap and labs from her on May 24th. Mood status: stable Satisfied with current treatment?: yes Symptom severity: mild  Duration of current treatment : chronic Side effects: no Medication compliance: good compliance Depressed mood: no Anxious mood: no Anhedonia: no Significant weight loss or gain: no Insomnia: none Fatigue: no Feelings  of worthlessness or guilt: no Impaired concentration/indecisiveness: no Suicidal ideations: no Hopelessness: no Crying spells: no    07/27/2022    8:19 AM 03/30/2022    9:02 AM 09/22/2021    8:12 AM 03/24/2021    1:07 PM 10/07/2020    9:37 AM  Depression screen PHQ 2/9  Decreased Interest 1 0 1 0 0  Down, Depressed, Hopeless 0 0 0 0 0  PHQ - 2 Score 1 0 1 0 0  Altered sleeping 3 3 2  0 0  Tired,  decreased energy 1 0 0 0 0  Change in appetite 0 1 0 0 0  Feeling bad or failure about yourself  0 0 0 0 0  Trouble concentrating 0 0 0 0 0  Moving slowly or fidgety/restless 0 0 0 0 0  Suicidal thoughts 0 0 0 0 0  PHQ-9 Score 5 4 3  0 0  Difficult doing work/chores Somewhat difficult  Not difficult at all Not difficult at all Not difficult at all       07/27/2022    8:20 AM 03/30/2022    9:02 AM 09/22/2021    8:13 AM 07/31/2019   10:16 AM  GAD 7 : Generalized Anxiety Score  Nervous, Anxious, on Edge 3 3 3 1   Control/stop worrying 3 2 3  0  Worry too much - different things 3 2 3  0  Trouble relaxing 3 2 3  0  Restless 0 1 0 0  Easily annoyed or irritable 2 2 1 3   Afraid - awful might happen 2 1 1  0  Total GAD 7 Score 16 13 14 4   Anxiety Difficulty Somewhat difficult  Somewhat difficult Somewhat difficult      04/04/2020    3:55 PM 05/09/2020   10:55 AM 11/30/2020   10:28 PM 09/22/2021    8:11 AM 07/27/2022    8:19 AM  Fall Risk  Falls in the past year? 0 0  0 0  Was there an injury with Fall?    0 0  Fall Risk Category Calculator    0 0  Fall Risk Category (Retired)    Low   (RETIRED) Patient Fall Risk Level   Low fall risk Low fall risk   Patient at Risk for Falls Due to    No Fall Risks No Fall Risks  Fall risk Follow up    Falls evaluation completed Falls evaluation completed    Functional Status Survey: Is the patient deaf or have difficulty hearing?: No Does the patient have difficulty seeing, even when wearing glasses/contacts?: No Does the patient have difficulty concentrating, remembering, or making decisions?: No Does the patient have difficulty walking or climbing stairs?: No Does the patient have difficulty dressing or bathing?: No Does the patient have difficulty doing errands alone such as visiting a doctor's office or shopping?: No    Past Medical History:  Past Medical History:  Diagnosis Date   Anxiety    Cervical syndrome    COPD (chronic obstructive pulmonary  disease) (HCC)    Depression    Edema 2013   legs   Endometriosis    Hyperlipidemia    Hypertension    IBS (irritable bowel syndrome)    Lump or mass in breast    left    Obesity    Plantar fibromatosis    Tobacco abuse    Type 2 diabetes mellitus with morbid obesity (HCC) 03/24/2020    Surgical History:  Past Surgical History:  Procedure Laterality Date  BREAST BIOPSY Left 02/03/2016   apocrine metaplasia    CERVICAL BIOPSY  W/ LOOP ELECTRODE EXCISION     CESAREAN SECTION  1996   COLPOSCOPY  2007   LEEP  2007    Medications:  Current Outpatient Medications on File Prior to Visit  Medication Sig   blood glucose meter kit and supplies KIT Dispense based on patient and insurance preference. Use up to four times daily as directed. (FOR ICD-9 250.00, 250.01).   buPROPion (WELLBUTRIN XL) 150 MG 24 hr tablet Take 1 tablet (150 mg total) by mouth daily.   cholecalciferol (VITAMIN D3) 25 MCG (1000 UNIT) tablet Take 1,000 Units by mouth daily.   Cinnamon 500 MG capsule Take 500 mg by mouth 2 (two) times daily.   Evolocumab (REPATHA) 140 MG/ML SOSY Inject 140 mg into the skin every 14 (fourteen) days.   fexofenadine (ALLEGRA ALLERGY) 180 MG tablet Take 1 tablet (180 mg total) by mouth daily.   ibuprofen (ADVIL) 600 MG tablet Take 1 tablet (600 mg total) by mouth every 6 (six) hours as needed.   methocarbamol (ROBAXIN) 500 MG tablet TAKE 1 TABLET BY MOUTH EVERY 8 HOURS AS NEEDED FOR MUSCLE SPASM   norethindrone (AYGESTIN) 5 MG tablet Take 1 tablet (5 mg total) by mouth daily.   Omega-3 1000 MG CAPS Take 2 capsules by mouth daily.   TURMERIC PO Take 1 tablet by mouth daily.   No current facility-administered medications on file prior to visit.    Allergies:  Allergies  Allergen Reactions   Bactrim [Sulfamethoxazole-Trimethoprim] Itching    Itching (skin and throat). Denies SOB or throat closing.   Banana Other (See Comments)    Reports lip swelling. Every now and then pt  states she has nausea and vomiting.    Social History:  Social History   Socioeconomic History   Marital status: Single    Spouse name: Not on file   Number of children: 1   Years of education: Not on file   Highest education level: Associate degree: occupational, Scientist, product/process development, or vocational program  Occupational History   Not on file  Tobacco Use   Smoking status: Former    Packs/day: 0.00    Years: 20.00    Additional pack years: 0.00    Total pack years: 0.00    Types: Cigarettes   Smokeless tobacco: Never  Vaping Use   Vaping Use: Every day   Substances: Nicotine, Flavoring   Devices: Zero Renvo  Substance and Sexual Activity   Alcohol use: Never   Drug use: Not Currently    Types: Marijuana    Comment: "from time to time"   Sexual activity: Not Currently    Birth control/protection: None  Other Topics Concern   Not on file  Social History Narrative   Not on file   Social Determinants of Health   Financial Resource Strain: Not on file  Food Insecurity: Food Insecurity Present (05/29/2018)   Hunger Vital Sign    Worried About Running Out of Food in the Last Year: Sometimes true    Ran Out of Food in the Last Year: Sometimes true  Transportation Needs: No Transportation Needs (07/30/2019)   PRAPARE - Administrator, Civil Service (Medical): No    Lack of Transportation (Non-Medical): No  Physical Activity: Inactive (02/07/2017)   Exercise Vital Sign    Days of Exercise per Week: 0 days    Minutes of Exercise per Session: 0 min  Stress: Stress Concern Present (  08/04/2019)   [employer] [employer] - Occupational Stress Questionnaire    Feeling of Stress : Rather much  Social Connections: Moderately Isolated (02/07/2017)   Social Connection and Isolation Panel [NHANES]    Frequency of Communication with Friends and Family: More than three times a week    Frequency of Social Gatherings with Friends and Family: Once a week    Attends  Religious Services: Never    Database administrator or Organizations: No    Attends Banker Meetings: Never    Marital Status: Divorced  Catering manager Violence: Not At Risk (02/07/2017)   Humiliation, Afraid, Rape, and Kick questionnaire    Fear of Current or Ex-Partner: No    Emotionally Abused: No    Physically Abused: No    Sexually Abused: No   Social History   Tobacco Use  Smoking Status Former   Packs/day: 0.00   Years: 20.00   Additional pack years: 0.00   Total pack years: 0.00   Types: Cigarettes  Smokeless Tobacco Never   Social History   Substance and Sexual Activity  Alcohol Use Never    Family History:  Family History  Adopted: Yes  Problem Relation Age of Onset   COPD Mother    Breast cancer Neg Hx     Past medical history, surgical history, medications, allergies, family history and social history reviewed with patient today and changes made to appropriate areas of the chart.   Review of Systems  Constitutional:  Negative for chills, diaphoresis, fever and weight loss.  Respiratory:  Negative for cough, shortness of breath and wheezing.   Cardiovascular:  Negative for chest pain, palpitations, orthopnea and leg swelling.  Neurological: Negative.   Endo/Heme/Allergies: Negative.   Psychiatric/Behavioral: Negative.     All other ROS negative except what is listed above and in the HPI.      Objective:    BP 101/75   Pulse 71   Temp 97.6 F (36.4 C) (Oral)   Ht 5' 7.99" (1.727 m)   Wt 155 lb 1.6 oz (70.4 kg)   SpO2 99%   BMI 23.59 kg/m   Wt Readings from Last 3 Encounters:  07/27/22 155 lb 1.6 oz (70.4 kg)  03/30/22 194 lb 1.6 oz (88 kg)  10/14/21 192 lb 12.8 oz (87.5 kg)    Physical Exam Vitals and nursing note reviewed. Exam conducted with a chaperone present.  Constitutional:      General: She is awake. She is not in acute distress.    Appearance: She is well-developed and well-groomed. She is not ill-appearing or  toxic-appearing.  HENT:     Head: Normocephalic and atraumatic.     Right Ear: Hearing, tympanic membrane, ear canal and external ear normal. No drainage.     Left Ear: Hearing, tympanic membrane, ear canal and external ear normal. No drainage.     Nose: Nose normal.     Right Sinus: No maxillary sinus tenderness or frontal sinus tenderness.     Left Sinus: No maxillary sinus tenderness or frontal sinus tenderness.     Mouth/Throat:     Mouth: Mucous membranes are moist.     Pharynx: Oropharynx is clear. Uvula midline. No pharyngeal swelling, oropharyngeal exudate or posterior oropharyngeal erythema.  Eyes:     General: Lids are normal.        Right eye: No discharge.        Left eye: No discharge.     Extraocular Movements: Extraocular  movements intact.     Conjunctiva/sclera: Conjunctivae normal.     Pupils: Pupils are equal, round, and reactive to light.     Visual Fields: Right eye visual fields normal and left eye visual fields normal.  Neck:     Thyroid: No thyromegaly.     Vascular: No carotid bruit.     Trachea: Trachea normal.  Cardiovascular:     Rate and Rhythm: Normal rate and regular rhythm.     Heart sounds: Normal heart sounds. No murmur heard.    No gallop.  Pulmonary:     Effort: Pulmonary effort is normal. No accessory muscle usage or respiratory distress.     Breath sounds: Normal breath sounds.  Chest:  Breasts:    Right: Normal.     Left: Normal.  Abdominal:     General: Bowel sounds are normal.     Palpations: Abdomen is soft. There is no hepatomegaly or splenomegaly.     Tenderness: There is no abdominal tenderness.  Musculoskeletal:        General: Normal range of motion.     Cervical back: Normal range of motion and neck supple.     Right hip: Normal.     Left hip: Tenderness present. No deformity, bony tenderness or crepitus. Normal range of motion. Normal strength.     Right lower leg: No edema.     Left lower leg: No edema.     Comments:  Tenderness to left hip with varus and valgus movement.    Lymphadenopathy:     Head:     Right side of head: No submental, submandibular, tonsillar, preauricular or posterior auricular adenopathy.     Left side of head: No submental, submandibular, tonsillar, preauricular or posterior auricular adenopathy.     Cervical: No cervical adenopathy.     Upper Body:     Right upper body: No supraclavicular, axillary or pectoral adenopathy.     Left upper body: No supraclavicular, axillary or pectoral adenopathy.  Skin:    General: Skin is warm and dry.     Capillary Refill: Capillary refill takes less than 2 seconds.     Findings: No rash.  Neurological:     Mental Status: She is alert and oriented to person, place, and time.     Gait: Gait is intact.     Deep Tendon Reflexes: Reflexes are normal and symmetric.     Reflex Scores:      Brachioradialis reflexes are 2+ on the right side and 2+ on the left side.      Patellar reflexes are 2+ on the right side and 2+ on the left side. Psychiatric:        Attention and Perception: Attention normal.        Mood and Affect: Mood normal.        Speech: Speech normal.        Behavior: Behavior normal. Behavior is cooperative.        Thought Content: Thought content normal.        Judgment: Judgment normal.    Results for orders placed or performed in visit on 05/18/22  HM DIABETES EYE EXAM  Result Value Ref Range   HM Diabetic Eye Exam No Retinopathy No Retinopathy      Assessment & Plan:   Problem List Items Addressed This Visit       Cardiovascular and Mediastinum   Hypertension associated with diabetes (HCC) (Chronic)    Chronic, stable .  BP well below  goal in office. Will discontinue Losartan, discussed with patient, as is having episodes of vertigo with position changes -- suspect some orthostatic hypotension due to baseline lower BP and weight loss.  Discussed benefit of ARB for kidney protection with proteinuria, however at this time  will hold off on taking.  Recommend she monitor BP at least a few mornings a week at home and document.  DASH diet at home.  Labs: CMP. Return in 3 months.       Relevant Orders   Bayer DCA Hb A1c Waived   Comprehensive metabolic panel     Endocrine   Hyperlipidemia associated with type 2 diabetes mellitus (HCC) (Chronic)    Chronic, ongoing.  History of poor tolerance statin. Continue Repatha, as levels without it on board elevate significantly.  Lipid panel today.  Return in 3 months.        Relevant Orders   Bayer DCA Hb A1c Waived   Comprehensive metabolic panel   Lipid Panel w/o Chol/HDL Ratio   Type 2 diabetes mellitus with proteinuria (HCC) - Primary (Chronic)    Diagnosed December 2021, last  A1c 5.3% last visit, remains well controlled.  She has lost total of >90 pounds, recheck A1c today.  Urine ALB 14 April 2022.   - Continue diet focus and exercise. - Stop Losartan due to orthostatic hypotension episodes with her weight loss. - Educated on blood sugar goals, less then 130 fasting in morning and <180 two hours after a meal.   - Eye and foot exam up to date. - Referral for skin exam. - Return in 3 months.      Relevant Orders   Bayer DCA Hb A1c Waived   Ambulatory referral to Dermatology     Other   Depression (Chronic)    Chronic, ongoing.  Continue current medication regimen and adjust as needed, will increase her Sertraline to 100 MG daily which she has been taking.  Denies SI/HI. Return to office in 3 months.      Relevant Medications   sertraline (ZOLOFT) 50 MG tablet   Acute pain of left hip    Acute for over one week, suspect muscle pull.  Will place referral to PT and recommend continue at home regimen for care.  Avoid any heavy exercise to hip area until improved.      Relevant Orders   Ambulatory referral to Physical Therapy   Elevated ALT measurement    Recheck CMP today.  Consider imaging if elevations continue.      Relevant Orders    Comprehensive metabolic panel   Myalgia due to statin    History of myalgia, even with 3 day a week Crestor.  Continue Repatha and recheck levels today.      Relevant Orders   Comprehensive metabolic panel   Lipid Panel w/o Chol/HDL Ratio   Perimenopause    Continue collaboration with Dr. Amado Nash to further discuss changes and treatment.  Is on BCP and not having cycles.  Needs pap, will discuss with GYN.      Vapes nicotine containing substance    I have recommended complete cessation of tobacco use. I have discussed various options available for assistance with tobacco cessation including over the counter methods (Nicotine gum, patch and lozenges). We also discussed prescription options (Chantix, Nicotine Inhaler / Nasal Spray). The patient is not interested in pursuing any prescription tobacco cessation options at this time.  Determine whether to start lung CT screening annually at age 57.  Other Visit Diagnoses     Encounter for annual physical exam       Annual physical today with labs and health maintenance reviewed, discussed with patient.        Follow up plan: Return in about 3 months (around 10/27/2022) for T2DM, HTN/HLD, MOOD.   LABORATORY TESTING:  - Pap smear: to have pap with GYN  IMMUNIZATIONS:   - Tdap: Tetanus vaccination status reviewed: last tetanus booster within 10 years. - Influenza: Up to date - Pneumovax: Not applicable - Prevnar: Not applicable - COVID: Up to date - HPV: Not applicable - Shingrix vaccine: Not applicable  SCREENING: -Mammogram: Up to date  - Colonoscopy: Up to date  - Bone Density: Not applicable  -Hearing Test: Not applicable  -Spirometry: Not applicable   PATIENT COUNSELING:   Advised to take 1 mg of folate supplement per day if capable of pregnancy.   Sexuality: Discussed sexually transmitted diseases, partner selection, use of condoms, avoidance of unintended pregnancy  and contraceptive alternatives.   Advised to  avoid cigarette smoking.  I discussed with the patient that most people either abstain from alcohol or drink within safe limits (<=14/week and <=4 drinks/occasion for males, <=7/weeks and <= 3 drinks/occasion for females) and that the risk for alcohol disorders and other health effects rises proportionally with the number of drinks per week and how often a drinker exceeds daily limits.  Discussed cessation/primary prevention of drug use and availability of treatment for abuse.   Diet: Encouraged to adjust caloric intake to maintain  or achieve ideal body weight, to reduce intake of dietary saturated fat and total fat, to limit sodium intake by avoiding high sodium foods and not adding table salt, and to maintain adequate dietary potassium and calcium preferably from fresh fruits, vegetables, and low-fat dairy products.    Stressed the importance of regular exercise  Injury prevention: Discussed safety belts, safety helmets, smoke detector, smoking near bedding or upholstery.   Dental health: Discussed importance of regular tooth brushing, flossing, and dental visits.    NEXT PREVENTATIVE PHYSICAL DUE IN 1 YEAR. Return in about 3 months (around 10/27/2022) for T2DM, HTN/HLD, MOOD.

## 2022-07-27 NOTE — Assessment & Plan Note (Signed)
Chronic, stable .  BP well below goal in office. Will discontinue Losartan, discussed with patient, as is having episodes of vertigo with position changes -- suspect some orthostatic hypotension due to baseline lower BP and weight loss.  Discussed benefit of ARB for kidney protection with proteinuria, however at this time will hold off on taking.  Recommend she monitor BP at least a few mornings a week at home and document.  DASH diet at home.  Labs: CMP. Return in 3 months.

## 2022-07-27 NOTE — Assessment & Plan Note (Signed)
Chronic, ongoing.  History of poor tolerance statin. Continue Repatha, as levels without it on board elevate significantly.  Lipid panel today.  Return in 3 months.

## 2022-07-28 LAB — COMPREHENSIVE METABOLIC PANEL
ALT: 51 IU/L — ABNORMAL HIGH (ref 0–32)
AST: 20 IU/L (ref 0–40)
Albumin/Globulin Ratio: 2.1 (ref 1.2–2.2)
Albumin: 4.4 g/dL (ref 3.9–4.9)
Alkaline Phosphatase: 41 IU/L — ABNORMAL LOW (ref 44–121)
BUN/Creatinine Ratio: 22 (ref 9–23)
BUN: 17 mg/dL (ref 6–24)
Bilirubin Total: 0.6 mg/dL (ref 0.0–1.2)
CO2: 23 mmol/L (ref 20–29)
Calcium: 9.5 mg/dL (ref 8.7–10.2)
Chloride: 101 mmol/L (ref 96–106)
Creatinine, Ser: 0.79 mg/dL (ref 0.57–1.00)
Globulin, Total: 2.1 g/dL (ref 1.5–4.5)
Glucose: 77 mg/dL (ref 70–99)
Potassium: 4.2 mmol/L (ref 3.5–5.2)
Sodium: 140 mmol/L (ref 134–144)
Total Protein: 6.5 g/dL (ref 6.0–8.5)
eGFR: 92 mL/min/{1.73_m2} (ref >59)

## 2022-07-28 LAB — LIPID PANEL W/O CHOL/HDL RATIO
Cholesterol, Total: 107 mg/dL (ref 100–199)
HDL: 37 mg/dL — ABNORMAL LOW (ref >39)
LDL Chol Calc (NIH): 53 mg/dL (ref 0–99)
Triglycerides: 82 mg/dL (ref 0–149)
VLDL Cholesterol Cal: 17 mg/dL (ref 5–40)

## 2022-07-28 NOTE — Progress Notes (Signed)
Contacted via MyChart   Good evening Stefanie Stewart, your labs have returned: - Kidney function, creatinine and eGFR, remains normal.  Liver function, AST and ALT, shows mild elevation in ALT -- this is very mild and we can continue to monitor.  Alkaline phosphatase remains a little lower too, we will continue to watch this as well.  Often this looks at gall bladder or bone health -- we worry more if higher levels. - Cholesterol levels much improved with Repatha on board -- with levels at goal. Keep being amazing!!  Thank you for allowing me to participate in your care.  I appreciate you. Kindest regards, Aarvi Stotts

## 2022-08-16 NOTE — Therapy (Addendum)
OUTPATIENT PHYSICAL THERAPY LOWER EXTREMITY EVALUATION   / DISCHARGE   Patient Name: Stefanie Stewart MRN: [id number] DOB:1975/04/23, 47 y.o., female Today's Date: 08/17/2022  END OF SESSION:  PT End of Session - 08/17/22 1543     Visit Number 1    Number of Visits 8    Date for PT Re-Evaluation 09/28/22    Progress Note Due on Visit 8    PT Start Time 1103    PT Stop Time 1148    PT Time Calculation (min) 45 min    Activity Tolerance Patient tolerated treatment well;No increased pain    Behavior During Therapy WFL for tasks assessed/performed             Past Medical History:  Diagnosis Date   Anxiety    Cervical syndrome    COPD (chronic obstructive pulmonary disease) (HCC)    Depression    Edema 2013   legs   Endometriosis    Hyperlipidemia    Hypertension    IBS (irritable bowel syndrome)    Lump or mass in breast    left    Obesity    Plantar fibromatosis    Tobacco abuse    Type 2 diabetes mellitus with morbid obesity (HCC) 03/24/2020   Past Surgical History:  Procedure Laterality Date   BREAST BIOPSY Left 02/03/2016   apocrine metaplasia    CERVICAL BIOPSY  W/ LOOP ELECTRODE EXCISION     CESAREAN SECTION  1996   COLPOSCOPY  2007   LEEP  2007   Patient Active Problem List   Diagnosis Date Noted   Acute pain of left hip 07/27/2022   Perimenopause 03/30/2022   Type 2 diabetes mellitus with proteinuria (HCC) 03/24/2020   Elevated ALT measurement 10/18/2017   Depression 07/11/2015   Hyperlipidemia associated with type 2 diabetes mellitus (HCC) 07/11/2015   IBS (irritable bowel syndrome) 07/11/2015   Hypertension associated with diabetes (HCC) 07/11/2015   Vapes nicotine containing substance 07/11/2015   Myalgia due to statin 07/11/2015   Endometriosis 05/14/2014    PCP: Marjie Skiff, NP  REFERRING PROVIDER: Marjie Skiff, NP  REFERRING DIAG:  Diagnosis  M25.552 (ICD-10-CM) - Acute pain of left hip    THERAPY DIAG:  Muscle weakness  (generalized) - Plan: PT plan of care cert/re-cert  Pain in left hip - Plan: PT plan of care cert/re-cert  Rationale for Evaluation and Treatment: Rehabilitation  ONSET DATE: 3 weeks ago when stretching post-workout, Stefanie Stewart had a severe spasm/strain of the left anterior and posterior thigh/hip.  SUBJECTIVE:   SUBJECTIVE STATEMENT: 3 weeks ago when stretching post-workout, Stefanie Stewart had a severe spasm/strain of the left anterior and posterior thigh/hip.  It is maybe 50% better today.  She would like to facilitate getting back into her normal activities without doing additional damage.  PERTINENT HISTORY: COPD, HLD, HTN, Obesity, smoker, Type 2 DM, previous C-section PAIN:  Are you having pain? Yes: NPRS scale: 0-4/10 /10 Pain location: Left anterior and posterior thigh Pain description: Was sharp, shooting, spasm, now constant, dull Aggravating factors: Prolonged sitting or standing Relieving factors: Walking  PRECAUTIONS: None  WEIGHT BEARING RESTRICTIONS: No  FALLS:  Has patient fallen in last 6 months? No  LIVING ENVIRONMENT: Lives with: lives alone Lives in: House/apartment Stairs:  No problems with stairs Has following equipment at home: None  OCCUPATION: Sales executive  PLOF: Independent  PATIENT GOALS: Get back to pre-injury status  NEXT MD VISIT: NA  OBJECTIVE:   DIAGNOSTIC FINDINGS: I  could not find any imaging associated with this referral  PATIENT SURVEYS:  FOTO Risk-adjusted 61 (Goal 99 in 8 visits)  COGNITION: Overall cognitive status: Within functional limits for tasks assessed     SENSATION: No complaints of peripheral pain or paresthesias  MUSCLE LENGTH: Hamstrings: Right 60 deg; Left 55 deg Thomas test: Normal bilaterally  POSTURE: flexed trunk    LOWER EXTREMITY ROM:  Passive ROM Right eval Left eval  Hip flexion 100 95  Hip extension    Hip abduction    Hip adduction    Hip internal rotation 7 3  Hip external rotation 40 34   Knee flexion    Knee extension    Ankle dorsiflexion    Ankle plantarflexion    Ankle inversion    Ankle eversion     (Blank rows = not tested)  LOWER EXTREMITY STRENGTH:  In pounds assessed with hand-held dynamometer or MMT Right eval Left eval  Hip flexion 53.8 pounds 35.4 pounds  Hip extension    Hip abduction 5/5 4+/5  Hip adduction    Hip internal rotation    Hip external rotation    Knee flexion    Knee extension 71.8 pounds 59.9 pounds  Ankle dorsiflexion    Ankle plantarflexion    Ankle inversion    Ankle eversion     (Blank rows = not tested)  GAIT: Distance walked: 100 feet Assistive device utilized: None Level of assistance: Complete Independence Comments: No significant deviations noted   TODAY'S TREATMENT:                                                                                                                              DATE: 08/17/2022  Seated and standing hip flexors isometrics 10 x 5 seconds each Seated straight leg raises 2 sets of 5 for 3 seconds   PATIENT EDUCATION:  Education details: Reviewed exam findings and home exercise program Person educated: Patient Education method: Explanation, Demonstration, Tactile cues, Verbal cues, and Handouts Education comprehension: verbalized understanding, returned demonstration, verbal cues required, tactile cues required, and needs further education  HOME EXERCISE PROGRAM: [id number]  ASSESSMENT:  CLINICAL IMPRESSION: Patient is a 47 y.o. female who was seen today for physical therapy evaluation and treatment for  Diagnosis  M25.552 (ICD-10-CM) - Acute pain of left hip  .  [name] appears to have strained her hip flexors while stretching after a workout.  Symptoms have improved on their own, although she still notes she is very limited with prolonged sitting, standing and her gym activities.  The emphasis of her physical therapy will be on stabilization while avoiding overuse and allowing things to  heal as quickly as possible.  I do not think this will be a long rehabilitation as long as she avoids overuse and is compliant with her home exercises.  OBJECTIVE IMPAIRMENTS: decreased activity tolerance, decreased endurance, decreased knowledge of condition, decreased strength, decreased safety awareness, impaired perceived functional ability, increased muscle  spasms, and pain.   ACTIVITY LIMITATIONS: sitting and standing  PARTICIPATION LIMITATIONS: community activity, occupation, and workouts  PERSONAL FACTORS: COPD, HLD, HTN, Obesity, smoker, Type 2 DM, previous C-section are also affecting patient's functional outcome.   REHAB POTENTIAL: Good  CLINICAL DECISION MAKING: Stable/uncomplicated  EVALUATION COMPLEXITY: Low   GOALS: Goals reviewed with patient? Yes  SHORT TERM GOALS: Target date: 09/14/2022 Improve left hip AROM to 100% of the uninvolved right Baseline: See objective Goal status: INITIAL  2.  Marisia will be independent with her day 1 home exercise program Baseline: Started 08/17/2022 Goal status: INITIAL   LONG TERM GOALS: Target date: 10/12/2022  Improve FOTO to 99 Baseline: Risk adjusted 61 Goal status: INITIAL  2.  Maddux will report left hip pain consistently 0-1 out of 10 on the numeric pain rating scale Baseline: Can be as high as 4 out of 10 Goal status: INITIAL  3.  Improve left hip strength to 95% or greater as compared to the uninvolved right Baseline: See objective Goal status: INITIAL  4.  Taegan will be independent with her long-term home maintenance program for long-term success Baseline: Started 08/17/2022 Goal status: INITIAL   PLAN:  PT FREQUENCY: 1x/week or every other week  PT DURATION: 6 weeks  PLANNED INTERVENTIONS: Therapeutic exercises, Therapeutic activity, Neuromuscular re-education, Balance training, Gait training, Patient/Family education, Self Care, Stair training, Cryotherapy, and Manual therapy  PLAN FOR NEXT SESSION:  Review day 1 home exercise program with appropriate stabilization and strength progressions without exacerbating overuse symptoms.   Cherlyn Cushing, PT, MPT 08/17/2022, 3:59 PM    PHYSICAL THERAPY DISCHARGE SUMMARY  Visits from Start of Care: 1  Current functional level related to goals / functional outcomes: See note   Remaining deficits: See note   Education / Equipment: HEP  Patient goals were not met. Patient is being discharged due to not returning since the last visit.  Chyrel Masson, PT, DPT, OCS, ATC 10/04/22  1:39 PM

## 2022-08-17 ENCOUNTER — Ambulatory Visit: Payer: BC Managed Care – PPO | Admitting: Psychiatry

## 2022-08-17 ENCOUNTER — Encounter: Payer: Self-pay | Admitting: Rehabilitative and Restorative Service Providers"

## 2022-08-17 ENCOUNTER — Ambulatory Visit (INDEPENDENT_AMBULATORY_CARE_PROVIDER_SITE_OTHER): Payer: BC Managed Care – PPO | Admitting: Rehabilitative and Restorative Service Providers"

## 2022-08-17 DIAGNOSIS — Z01411 Encounter for gynecological examination (general) (routine) with abnormal findings: Secondary | ICD-10-CM | POA: Diagnosis not present

## 2022-08-17 DIAGNOSIS — M25552 Pain in left hip: Secondary | ICD-10-CM | POA: Diagnosis not present

## 2022-08-17 DIAGNOSIS — M6281 Muscle weakness (generalized): Secondary | ICD-10-CM

## 2022-08-17 DIAGNOSIS — N911 Secondary amenorrhea: Secondary | ICD-10-CM | POA: Diagnosis not present

## 2022-08-17 DIAGNOSIS — Z124 Encounter for screening for malignant neoplasm of cervix: Secondary | ICD-10-CM | POA: Diagnosis not present

## 2022-08-24 ENCOUNTER — Ambulatory Visit
Admission: RE | Admit: 2022-08-24 | Discharge: 2022-08-24 | Disposition: A | Discharge status: Home / Self Care | Payer: BC Managed Care – PPO | Source: Ambulatory Visit | Attending: Nurse Practitioner | Admitting: Nurse Practitioner

## 2022-08-24 ENCOUNTER — Encounter: Payer: BC Managed Care – PPO | Admitting: Physical Therapy

## 2022-08-24 DIAGNOSIS — Z1231 Encounter for screening mammogram for malignant neoplasm of breast: Secondary | ICD-10-CM | POA: Diagnosis not present

## 2022-08-31 ENCOUNTER — Encounter: Payer: BC Managed Care – PPO | Admitting: Rehabilitative and Restorative Service Providers"

## 2022-09-07 ENCOUNTER — Ambulatory Visit: Payer: BC Managed Care – PPO | Admitting: Psychiatry

## 2022-09-07 ENCOUNTER — Encounter: Payer: BC Managed Care – PPO | Admitting: Rehabilitative and Restorative Service Providers"

## 2022-09-07 DIAGNOSIS — Z638 Other specified problems related to primary support group: Secondary | ICD-10-CM

## 2022-09-07 DIAGNOSIS — F411 Generalized anxiety disorder: Secondary | ICD-10-CM

## 2022-09-07 DIAGNOSIS — F325 Major depressive disorder, single episode, in full remission: Secondary | ICD-10-CM | POA: Diagnosis not present

## 2022-09-07 DIAGNOSIS — Z566 Other physical and mental strain related to work: Secondary | ICD-10-CM

## 2022-09-07 DIAGNOSIS — E119 Type 2 diabetes mellitus without complications: Secondary | ICD-10-CM

## 2022-09-07 DIAGNOSIS — N951 Menopausal and female climacteric states: Secondary | ICD-10-CM

## 2022-09-07 NOTE — Progress Notes (Signed)
Psychotherapy Progress Note [facility] Marliss Czar, PhD LP  Patient ID: Stefanie Stewart)    MRN: [id number] Therapy format: Individual psychotherapy Date: 09/07/2022      Start: 10:14a     Stop: 11:00a     Time Spent: 46 min Location: In-person   Session narrative (presenting needs, interim history, self-report of stressors and symptoms, applications of prior therapy, status changes, and interventions made in session) Doing well.  Has lost 100 lbs now.  Continues frequent small meals, exercising actively, and transitioning from keto mode to reintroducing other foods.  Yogurt has become too difficult, and mixed berries, but able to handle cottage cheese and single berries.  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  Family trip to beach involved B's unruly drinking friend and both of them being loud and trashy, required her to call him off one night for sleep, and a lot of being "embarrassed" for [name].  Able to recognize that it's not her shame, just disgusted.    Aunt's estate is closer to closing, and already been some disbursement, which seems to have calmed emotions for heirs, helping her feel less under the gun.  Medically, good on colonoscopy, mammogram, and Pap.  Will be seeing dermatologist soon. Trying to add weights to her workout.  Did find out she's in menopause, and she's come off BCP, feels better.  Knows weight work will help her with that, and osteoporosis risk.  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  Still some issue yet to work through about how Dr. Delane Ginger can tense up, get brainlocked on unrealistic ideas of what staff should be doing next, and once in a while seem to scold staff in front of patients.    Might want to start dating, but still intimidated about online dating.   Discussed options and ways to learn more about how it works.  Reaffirmed she retains her choice every step of the way, and looking, or chatting, does not mean any commitment.    Therapeutic modalities: Cognitive Behavioral Therapy, Solution-Oriented/Positive Psychology, and Ego-Supportive  Mental Status/Observations:  Appearance:   Casual     Behavior:  Appropriate  Motor:  Normal  Speech/Language:   Clear and Coherent  Affect:  Appropriate  Mood:  Less anxious  Thought process:  normal  Thought content:    WNL  Sensory/Perceptual disturbances:    WNL  Orientation:  Fully oriented  Attention:  Good    Concentration:  Fair  Memory:  WNL  Insight:    Good  Judgment:   Good  Impulse Control:  Good   Risk Assessment: Danger to Self: No Self-injurious Behavior: No Danger to Others: No Physical Aggression / Violence: No Duty to Warn: No Access to Firearms a concern: No  Assessment of progress:  progressing  Diagnosis:   ICD-10-CM   1. Generalized anxiety disorder  F41.1     2. 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 Work stress  Z56.6     4. Relationship problem with family members  Z63.8     5. Menopause syndrome  N95.1     6. Type 2 diabetes mellitus without complication, without long-term current use of insulin (HCC)  E11.9      Plan:  [employer] -- Work through tasks with relatives,  trust until proven otherwise, but retain the right to decide for herself how to conduct things Health -- Continue good diabetes care with diet & exercise and fade aggressive weight loss plan to more normal diet with control.  Recommend ongoing carb control for  emotional equilibrium and menopause support. Occupational stress -- Continue integrating into the new work environment.  Try to worry less what people think of her and bring her own peace of mind to interactions Family dynamics -- Continue communication advice with intrusive or otherwise  pressuring family/friends Self-soothing techniques -- mindful minute, listen to ambient noise (clock, air vent), try listening to a metronome app and playing with tempo Worry/catastrophizing -- For safety fear scenarios, try to see deterrents to predatory crime from the predator's perspective Social -- Endorse exploring dating, as interested Other recommendations/advice -- As may be noted above.  Continue to utilize previously learned skills ad lib. Medication compliance -- Maintain medication as prescribed and work faithfully with relevant prescriber(s) if any changes are desired or seem indicated. Crisis service -- Aware of call list and work-in appts.  Call the clinic on-call service, 988/hotline, 911, or present to [facility] or ER if any life-threatening psychiatric crisis. Followup -- No follow-ups on file.  Next scheduled visit with me 10/26/2022.  Next scheduled in this office 10/26/2022.  Robley Fries, PhD Marliss Czar, PhD LP Clinical Psychologist, [facility] [address], [address] [place], [place] [address] [phone]

## 2022-09-14 ENCOUNTER — Encounter: Payer: BC Managed Care – PPO | Admitting: Rehabilitative and Restorative Service Providers"

## 2022-09-21 ENCOUNTER — Encounter: Payer: BC Managed Care – PPO | Admitting: Rehabilitative and Restorative Service Providers"

## 2022-09-24 DIAGNOSIS — D225 Melanocytic nevi of trunk: Secondary | ICD-10-CM | POA: Diagnosis not present

## 2022-09-24 DIAGNOSIS — L814 Other melanin hyperpigmentation: Secondary | ICD-10-CM | POA: Diagnosis not present

## 2022-09-24 DIAGNOSIS — L918 Other hypertrophic disorders of the skin: Secondary | ICD-10-CM | POA: Diagnosis not present

## 2022-09-24 DIAGNOSIS — L821 Other seborrheic keratosis: Secondary | ICD-10-CM | POA: Diagnosis not present

## 2022-09-25 ENCOUNTER — Encounter: Payer: BC Managed Care – PPO | Admitting: Rehabilitative and Restorative Service Providers"

## 2022-10-05 ENCOUNTER — Ambulatory Visit: Payer: BC Managed Care – PPO | Admitting: Psychiatry

## 2022-10-26 ENCOUNTER — Ambulatory Visit (INDEPENDENT_AMBULATORY_CARE_PROVIDER_SITE_OTHER): Payer: BC Managed Care – PPO | Admitting: Psychiatry

## 2022-10-26 DIAGNOSIS — F411 Generalized anxiety disorder: Secondary | ICD-10-CM | POA: Diagnosis not present

## 2022-10-26 DIAGNOSIS — Z609 Problem related to social environment, unspecified: Secondary | ICD-10-CM | POA: Diagnosis not present

## 2022-10-26 DIAGNOSIS — N951 Menopausal and female climacteric states: Secondary | ICD-10-CM

## 2022-10-26 DIAGNOSIS — F325 Major depressive disorder, single episode, in full remission: Secondary | ICD-10-CM | POA: Diagnosis not present

## 2022-10-26 NOTE — Progress Notes (Signed)
Psychotherapy Progress Note [facility] Marliss Czar, PhD LP  Patient ID: Stefanie Stewart)    MRN: [id number] Therapy format: Individual psychotherapy Date: 10/26/2022      Start: 10:19a     Stop: 11:06a     Time Spent: 47 min Location: In-person   Session narrative (presenting needs, interim history, self-report of stressors and symptoms, applications of prior therapy, status changes, and interventions made in session) Excited, and nervous, to reveal that she has been on 3 dates since last met, after a coworker put her on Facebook.  2nd date with Jolly Mango, lots of questions how to act.  Finding Tawanna Cooler a very centered, mature, nondemanding presence, a friend already, who is willing to help interpret men, and dating, and grounded in Al-Anon and therapy.    Congratulated on getting her feet wet despite a lot of anxiety.  Cautioned against the tendency to overexplain and frontload.  Validated it's OK to be playing the field, not quick to commit nor have to go exclusive before she is truly ready and interested.  Concerned about menopause, her own sexual functioning, what standard to apply to being sexually active, and a slew of related issues.  Outlined values, options for sexual readiness if she chooses to engage, gynecologist's availability to consult, and guidelines for communicating about boundaries.  Did find that a workman who came over turned her on so much she actually resumed having a period.  Validated how that could happen, actually, by way of the emotional brain sending signal hormones enough to wake up (i.e., affirmative psychosomatic effect vs. artifact of a temporary uptick in hormone production).  Colton's 26th birthday is next week, will se him -- hopefully -- next week.  Got settlement from [employer] and bought a car, which is a substantial relief.  Affirmed and encouraged working through all these adjustments the past 2+ years.  Therapeutic modalities:  Cognitive Behavioral Therapy, Solution-Oriented/Positive Psychology, Ego-Supportive, and Psycho-education/Bibliotherapy  Mental Status/Observations:  Appearance:   Casual     Behavior:  Appropriate  Motor:  Normal  Speech/Language:   Clear and Coherent  Affect:  Appropriate  Mood:  anxious and euthymic  Thought process:  normal and mild flight  Thought content:    WNL  Sensory/Perceptual disturbances:    WNL  Orientation:  Fully oriented  Attention:  Good    Concentration:  Fair  Memory:  WNL  Insight:    Good  Judgment:   Good  Impulse Control:  Good   Risk Assessment: Danger to Self: No Self-injurious Behavior: No Danger to Others: No Physical Aggression / Violence: No Duty to Warn: No Access to Firearms a concern: No  Assessment of progress:  progressing  Diagnosis:   ICD-10-CM   1. Generalized anxiety disorder  F41.1     2. 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 Menopause syndrome  N95.1     4. 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  Recommend ongoing carb control for emotional equilibrium and menopause support. Occupational stress -- Continue integrating into the new work environment.  Try to worry less what people think of her and bring her own peace of mind to interactions.  Continue picking battles about features  of the work environment or process to challenge.  Family dynamics -- Continue communication advice with intrusive or otherwise pressuring family/friends Self-soothing techniques -- Mindful minute, listen to ambient noise (clock, air vent), try listening to a metronome app and playing with the tempo Worry/catastrophizing -- Continue letting people draw their own conclusions, explain changes and attitudes at discretion.   For safety fear scenarios, try to see deterrents to predatory crime from the predator's perspective, esp how deterred they would be by measures taken. Social -- Endorse exploring dating, as interested.  For sexual response and readiness concerns, see gynecologist or PCP, or consult Tx further as interested for common interventions. Other recommendations/advice -- As may be noted above.  Continue to utilize previously learned skills ad lib. Medication compliance -- Maintain medication as prescribed and work faithfully with relevant prescriber(s) if any changes are desired or seem indicated. Crisis service -- Aware of call list and work-in appts.  Call the clinic on-call service, 988/hotline, 911, or present to [facility] or ER if any life-threatening psychiatric crisis. Followup -- Return for time as already scheduled.  Next scheduled visit with me 11/30/2022.  Next scheduled in this office 11/30/2022.  Robley Fries, PhD Marliss Czar, PhD LP Clinical Psychologist, [facility] [address] [place], [place] [address] [phone]

## 2022-11-02 ENCOUNTER — Ambulatory Visit: Payer: BC Managed Care – PPO | Admitting: Nurse Practitioner

## 2022-11-02 DIAGNOSIS — N898 Other specified noninflammatory disorders of vagina: Secondary | ICD-10-CM | POA: Diagnosis not present

## 2022-11-03 ENCOUNTER — Other Ambulatory Visit: Payer: Self-pay | Admitting: Nurse Practitioner

## 2022-11-05 NOTE — Telephone Encounter (Signed)
Requested Prescriptions  Pending Prescriptions Disp Refills   REPATHA SURECLICK 140 MG/ML SOAJ [Pharmacy Med Name: Repatha SureClick 140 MG/ML Subcutaneous Solution Auto-injector] 2 mL 0    Sig: INJECT 140 MG INTO THE SKIN EVERY 14 (FOURTEEN) DAYS     Cardiovascular: PCSK9 Inhibitors Failed - 11/03/2022  7:12 AM      Failed - Lipid Panel completed within the last 12 months    Cholesterol, Total  Date Value Ref Range Status  07/27/2022 107 100 - 199 mg/dL Final   LDL Chol Calc (NIH)  Date Value Ref Range Status  07/27/2022 53 0 - 99 mg/dL Final   HDL  Date Value Ref Range Status  07/27/2022 37 (L) >39 mg/dL Final   Triglycerides  Date Value Ref Range Status  07/27/2022 82 0 - 149 mg/dL Final         Passed - Valid encounter within last 12 months    Recent Outpatient Visits           3 months ago Type 2 diabetes mellitus with proteinuria (HCC)   [facility] [place], [place] T, NP   7 months ago Type 2 diabetes mellitus with proteinuria (HCC)   [facility] [place], Corrie Dandy T, NP   1 year ago Type 2 diabetes mellitus with proteinuria (HCC)   [facility] [place], Corrie Dandy T, NP   1 year ago Type 2 diabetes mellitus with morbid obesity (HCC)   [facility] [place], Corrie Dandy T, NP   1 year ago Vitamin D deficiency   [facility] [place], Jake Church, NP       Future Appointments             In 1 week [name], Dorie Rank, NP [facility]

## 2022-11-11 NOTE — Patient Instructions (Signed)
Diabetes Mellitus and Skin Care Diabetes, also called diabetes mellitus, can lead to skin problems. If blood sugar (glucose) is not well controlled, it can cause problems over time. These problems include: Damage to nerves. This can affect your ability to feel wounds. This means you may not notice small skin injuries that could lead to bigger problems. This can also decrease the amount that you sweat, causing dry skin. Damage to blood vessels. The lack of blood flow can cause skin to break down. It can also slow healing time, which can lead to infections. Areas of skin that become thick or discolored. Common skin conditions There are certain skin conditions that often affect people with diabetes. These include: Dry skin. Thin skin. The skin on the feet may get thinner, break more easily, and heal more slowly than normal. Skin infections from bacteria. These include: Styes. These are infections near the eyelid. Boils. These are bumps filled with pus. Infected hair follicles. Infections of the skin around the nails. Fungal skin infections. These are most common in areas where skin rubs together, such as in the armpits or under the breasts. Common skin changes Diabetes can also cause the skin to change. You may develop: Dark, velvety markings on your skin. These may appear on your face, neck, armpits, inner thighs, and groin. Red, raised, scar-like tissue that may itch, feel painful, or become a wound. Blisters on your feet, toes, hands, or fingers. Thick, wax-like areas of skin. In most cases, these occur on the hands, forehead, or toes. Brown or red, ring-shaped or half-ring-shaped patches of skin on the ears or fingers. Pea-shaped, yellow bumps that may be itchy and have a red ring around them. This may affect your arms, feet, buttocks, and the top of your hands. Round, discolored patches of tan skin that do not hurt or itch. These may look like age spots. Supplies needed: Mild soap or  gentle skin cleanser. Lotion. How to care for dry, itchy skin Frequent high glucose levels can cause skin to become itchy. Poor blood circulation and skin infections can make dry skin worse. If you have dry, itchy skin: Avoid very hot showers and baths. Use mild soap and gentle skin cleansers. Do not use soap that is perfumed, harsh, or that dries your skin. Moisturizing soaps may help. Put on moisturizing lotion as soon as you finish bathing. Do not scratch dry skin. Scratching can expose skin to infection. If you have a rash or if your skin is very itchy, contact your health care provider. Skin that is red or covered in a rash may be a sign of an allergic reaction. Very itchy skin may mean that you need help to manage your diabetes better. You may also need treatment for an infection. General tips Most skin problems can be prevented or treated easily if caught early. Talk with your health care provider if you have any concerns. General tips include: Check your skin every day for cuts, bruises, redness, blisters, or sores, especially on your feet. If you cannot see the bottom of your feet, use a mirror or ask someone for help. Tell your health care provider if you have any of these injuries and if they are healing slowly. Keep your skin clean and dry. Do not use hot water. Moisturize your skin to prevent chapping. Keep your blood glucose levels within target range. Follow these instructions at home:  Take over-the-counter and prescription medicines only as told by your health care provider. This includes all diabetes medicines   you are taking. Schedule a foot exam with your health care provider once a year. During the exam, the structure and skin of your feet will be checked for problems. Make sure that your health care provider does a visual foot exam at every visit. If you get a skin injury, such as a cut, blister, or sore, check the area every day for signs of infection. Check for: Redness,  swelling, or pain. Fluid or blood. Warmth. Pus or a bad smell. Do not use any products that contain nicotine or tobacco. These products include cigarettes, chewing tobacco, and vaping devices, such as e-cigarettes. If you need help quitting, ask your health care provider. Where to find more information American Diabetes Association: diabetes.org Association of Diabetes Care & Education Specialists: diabeteseducator.org Contact a health care provider if: You get a cut or sore, especially on your feet. You have signs of infection after a skin injury. You have itchy skin that turns red or develops a rash. You have discolored areas of skin. You have places on your skin that change. They may thicken or appear shiny. This information is not intended to replace advice given to you by your health care provider. Make sure you discuss any questions you have with your health care provider. Document Revised: 09/13/2021 Document Reviewed: 09/13/2021 Elsevier Patient Education  2024 Elsevier Inc.  

## 2022-11-16 ENCOUNTER — Encounter: Payer: Self-pay | Admitting: Nurse Practitioner

## 2022-11-16 ENCOUNTER — Ambulatory Visit: Payer: BC Managed Care – PPO | Admitting: Nurse Practitioner

## 2022-11-16 VITALS — BP 122/68 | HR 78 | Temp 97.8°F | Resp 18 | Ht 67.5 in | Wt 151.6 lb

## 2022-11-16 DIAGNOSIS — E1169 Type 2 diabetes mellitus with other specified complication: Secondary | ICD-10-CM

## 2022-11-16 DIAGNOSIS — M791 Myalgia, unspecified site: Secondary | ICD-10-CM

## 2022-11-16 DIAGNOSIS — E1129 Type 2 diabetes mellitus with other diabetic kidney complication: Secondary | ICD-10-CM

## 2022-11-16 DIAGNOSIS — E1159 Type 2 diabetes mellitus with other circulatory complications: Secondary | ICD-10-CM | POA: Diagnosis not present

## 2022-11-16 DIAGNOSIS — T148XXA Other injury of unspecified body region, initial encounter: Secondary | ICD-10-CM

## 2022-11-16 DIAGNOSIS — R809 Proteinuria, unspecified: Secondary | ICD-10-CM

## 2022-11-16 DIAGNOSIS — F320 Major depressive disorder, single episode, mild: Secondary | ICD-10-CM | POA: Diagnosis not present

## 2022-11-16 DIAGNOSIS — E785 Hyperlipidemia, unspecified: Secondary | ICD-10-CM | POA: Diagnosis not present

## 2022-11-16 DIAGNOSIS — I152 Hypertension secondary to endocrine disorders: Secondary | ICD-10-CM | POA: Diagnosis not present

## 2022-11-16 DIAGNOSIS — T466X5A Adverse effect of antihyperlipidemic and antiarteriosclerotic drugs, initial encounter: Secondary | ICD-10-CM

## 2022-11-16 DIAGNOSIS — Z720 Tobacco use: Secondary | ICD-10-CM

## 2022-11-16 DIAGNOSIS — N951 Menopausal and female climacteric states: Secondary | ICD-10-CM

## 2022-11-16 LAB — BAYER DCA HB A1C WAIVED: HB A1C (BAYER DCA - WAIVED): 5.1 % (ref 4.8–5.6)

## 2022-11-16 MED ORDER — MUPIROCIN 2 % EX OINT: 1.0000 | TOPICAL_OINTMENT | Freq: Two times a day (BID) | CUTANEOUS | 4 refills | Status: DC

## 2022-11-16 MED ORDER — FEXOFENADINE HCL 180 MG PO TABS: 180.0000 mg | ORAL_TABLET | Freq: Every day | ORAL | Status: AC | PRN

## 2022-11-16 MED ORDER — REPATHA SURECLICK 140 MG/ML ~~LOC~~ SOAJ: SUBCUTANEOUS | 4 refills | Status: AC

## 2022-11-16 NOTE — Assessment & Plan Note (Signed)
Chronic, ongoing.  History of poor tolerance statin. Continue Repatha, as levels without it on board elevate significantly.  Lipid panel today.  Return in 3 months.

## 2022-11-16 NOTE — Progress Notes (Signed)
BP 122/68 (BP Location: Left Arm, Patient Position: Sitting, Cuff Size: Normal)   Pulse 78   Temp 97.8 F (36.6 C) (Oral)   Resp 18   Ht 5' 7.5" (1.715 m)   Wt 151 lb 9.6 oz (68.8 kg)   SpO2 97%   BMI 23.39 kg/m    Subjective:    Patient ID: Stefanie Stewart, female    DOB: September 26, 1975, 47 y.o.   MRN: [id number]  HPI: Stefanie Stewart is a 47 y.o. female  Chief Complaint  Patient presents with   Hypertension   Hyperlipidemia   Diabetes   Depression   DIABETES Diagnosed in December 2021 when A1c was 11.3%, started on Metformin 1000 MG BID. Currently she is diet controlled.  She continues to work-out and working on diet.  She has lost 53 lbs since January.  Last A1c in May was 5.4%. Hypoglycemic episodes:no Polydipsia/polyuria: no Visual disturbance: no Chest pain: no Paresthesias: no Glucose Monitoring: yes  Accucheck frequency: Daily  Fasting glucose:  average around 80 at home  Post prandial:  Evening:  Before meals: Taking Insulin?: no  Long acting insulin:  Short acting insulin: Blood Pressure Monitoring: not checking Retinal Examination: Up to Date Foot Exam: Up to Date  Pneumovax: Up To Date Influenza: Up To Date Aspirin: no   HYPERTENSION / HYPERLIPIDEMIA Taking Repatha for HLD - takes only when insurance will help her.  Does not tolerate statins or Zetia.  Continues to vape, varies day to day how often she uses.     Was on Losartan in past, but this was stopped with her weight loss. Satisfied with current treatment? yes Duration of hypertension: chronic BP monitoring frequency: not checking BP range:  BP medication side effects: no Duration of hyperlipidemia: chronic Cholesterol medication side effects: no Cholesterol supplements: none Medication compliance: good compliance Aspirin: no Recent stressors: no Recurrent headaches: no Visual changes: no Palpitations: no Dyspnea: a little bit this week Chest pain: no Lower extremity edema:  no Dizzy/lightheaded: no  The ASCVD Risk score (Arnett DK, et al., 2019) failed to calculate for the following reasons:   The valid total cholesterol range is 130 to 320 mg/dL  DEPRESSION Continues on Wellbutrin and Zoloft with benefit.  Is online dating which causes some anxiety. Mood status: stable Satisfied with current treatment?: yes Symptom severity: mild  Duration of current treatment : chronic Side effects: no Medication compliance: good compliance Psychotherapy/counseling: yes current Depressed mood: no Anxious mood: yes Anhedonia: no Significant weight loss or gain: no Insomnia: yes hard to fall asleep Fatigue: no Feelings of worthlessness or guilt: no Impaired concentration/indecisiveness: no Suicidal ideations: no Hopelessness: no Crying spells: no    11/16/2022    8:47 AM 07/27/2022    8:19 AM 03/30/2022    9:02 AM 09/22/2021    8:12 AM 03/24/2021    1:07 PM  Depression screen PHQ 2/9  Decreased Interest 0 1 0 1 0  Down, Depressed, Hopeless 0 0 0 0 0  PHQ - 2 Score 0 1 0 1 0  Altered sleeping 3 3 3 2  0  Tired, decreased energy 0 1 0 0 0  Change in appetite 0 0 1 0 0  Feeling bad or failure about yourself  0 0 0 0 0  Trouble concentrating 0 0 0 0 0  Moving slowly or fidgety/restless 0 0 0 0 0  Suicidal thoughts 0 0 0 0 0  PHQ-9 Score 3 5 4 3  0  Difficult doing  work/chores Not difficult at all Somewhat difficult  Not difficult at all Not difficult at all       11/16/2022    8:47 AM 07/27/2022    8:20 AM 03/30/2022    9:02 AM 09/22/2021    8:13 AM  GAD 7 : Generalized Anxiety Score  Nervous, Anxious, on Edge 2 3 3 3   Control/stop worrying 2 3 2 3   Worry too much - different things 2 3 2 3   Trouble relaxing 2 3 2 3   Restless 2 0 1 0  Easily annoyed or irritable 2 2 2 1   Afraid - awful might happen 0 2 1 1   Total GAD 7 Score 12 16 13 14   Anxiety Difficulty Somewhat difficult Somewhat difficult  Somewhat difficult   Relevant past medical, surgical, family and  social history reviewed and updated as indicated. Interim medical history since our last visit reviewed. Allergies and medications reviewed and updated.  Review of Systems  Constitutional:  Negative for activity change, appetite change, diaphoresis, fatigue and fever.  Respiratory:  Negative for cough, chest tightness and shortness of breath.   Cardiovascular:  Negative for chest pain, palpitations and leg swelling.  Gastrointestinal: Negative.   Endocrine: Negative for polydipsia, polyphagia and polyuria.  Neurological: Negative.   Psychiatric/Behavioral: Negative.      Per HPI unless specifically indicated above     Objective:    BP 122/68 (BP Location: Left Arm, Patient Position: Sitting, Cuff Size: Normal)   Pulse 78   Temp 97.8 F (36.6 C) (Oral)   Resp 18   Ht 5' 7.5" (1.715 m)   Wt 151 lb 9.6 oz (68.8 kg)   SpO2 97%   BMI 23.39 kg/m   Wt Readings from Last 3 Encounters:  11/16/22 151 lb 9.6 oz (68.8 kg)  07/27/22 155 lb 1.6 oz (70.4 kg)  03/30/22 194 lb 1.6 oz (88 kg)    Physical Exam Vitals and nursing note reviewed.  Constitutional:      General: She is awake. She is not in acute distress.    Appearance: She is well-developed and well-groomed. She is not ill-appearing or toxic-appearing.  HENT:     Head: Normocephalic.     Right Ear: Hearing, tympanic membrane, ear canal and external ear normal.     Left Ear: Hearing, tympanic membrane, ear canal and external ear normal.  Eyes:     General: Lids are normal.        Right eye: No discharge.        Left eye: No discharge.     Conjunctiva/sclera: Conjunctivae normal.     Pupils: Pupils are equal, round, and reactive to light.  Neck:     Thyroid: No thyromegaly.     Vascular: No carotid bruit.  Cardiovascular:     Rate and Rhythm: Normal rate and regular rhythm.     Heart sounds: Normal heart sounds. No murmur heard.    No gallop.  Pulmonary:     Effort: Pulmonary effort is normal. No accessory muscle usage  or respiratory distress.     Breath sounds: Normal breath sounds.  Abdominal:     General: Bowel sounds are normal.     Palpations: Abdomen is soft.  Musculoskeletal:     Cervical back: Normal range of motion and neck supple.     Right lower leg: No edema.     Left lower leg: No edema.  Lymphadenopathy:     Cervical: No cervical adenopathy.  Skin:  General: Skin is warm and dry.     Findings: Abrasion present.       Neurological:     Mental Status: She is alert and oriented to person, place, and time.  Psychiatric:        Attention and Perception: Attention normal.        Mood and Affect: Mood normal.        Speech: Speech normal.        Behavior: Behavior normal. Behavior is cooperative.        Thought Content: Thought content normal.    Results for orders placed or performed in visit on 07/27/22  Bayer DCA Hb A1c Waived  Result Value Ref Range   HB A1C (BAYER DCA - WAIVED) 5.4 4.8 - 5.6 %  Comprehensive metabolic panel  Result Value Ref Range   Glucose 77 70 - 99 mg/dL   BUN 17 6 - 24 mg/dL   Creatinine, Ser 9.14 0.57 - 1.00 mg/dL   eGFR 92 >78 GN/FAO/1.30   BUN/Creatinine Ratio 22 9 - 23   Sodium 140 134 - 144 mmol/L   Potassium 4.2 3.5 - 5.2 mmol/L   Chloride 101 96 - 106 mmol/L   CO2 23 20 - 29 mmol/L   Calcium 9.5 8.7 - 10.2 mg/dL   Total Protein 6.5 6.0 - 8.5 g/dL   Albumin 4.4 3.9 - 4.9 g/dL   Globulin, Total 2.1 1.5 - 4.5 g/dL   Albumin/Globulin Ratio 2.1 1.2 - 2.2   Bilirubin Total 0.6 0.0 - 1.2 mg/dL   Alkaline Phosphatase 41 (L) 44 - 121 IU/L   AST 20 0 - 40 IU/L   ALT 51 (H) 0 - 32 IU/L  Lipid Panel w/o Chol/HDL Ratio  Result Value Ref Range   Cholesterol, Total 107 100 - 199 mg/dL   Triglycerides 82 0 - 149 mg/dL   HDL 37 (L) >86 mg/dL   VLDL Cholesterol Cal 17 5 - 40 mg/dL   LDL Chol Calc (NIH) 53 0 - 99 mg/dL      Assessment & Plan:   Problem List Items Addressed This Visit       Cardiovascular and Mediastinum   Hypertension  associated with diabetes (HCC) (Chronic)    Chronic, stable .  BP well below goal today. Maintain off Losartan.  Discussed benefit of ARB for kidney protection with proteinuria, however at this time will hold off on taking.  Recommend she monitor BP at least a few mornings a week at home and document.  DASH diet at home.  Labs: BMP. Return in 3 months.       Relevant Medications   Evolocumab (REPATHA SURECLICK) 140 MG/ML SOAJ   Other Relevant Orders   Bayer DCA Hb A1c Waived   Basic metabolic panel     Endocrine   Hyperlipidemia associated with type 2 diabetes mellitus (HCC) (Chronic)    Chronic, ongoing.  History of poor tolerance statin. Continue Repatha, as levels without it on board elevate significantly.  Lipid panel today.  Return in 3 months.        Relevant Medications   Evolocumab (REPATHA SURECLICK) 140 MG/ML SOAJ   Other Relevant Orders   Bayer DCA Hb A1c Waived   Lipid Panel w/o Chol/HDL Ratio   Type 2 diabetes mellitus with proteinuria (HCC) - Primary (Chronic)    Diagnosed December 2021, last  A1c 5.1% today, remains well controlled.  She has lost total of 53 lbs since January, recommend she not lose anymore  more weight, but work on strengthening and lots of protein.  Urine ALB 14 April 2022.   - Continue diet focus and exercise. - Stopped Losartan past visit due to orthostatic hypotension episodes with her weight loss. - Educated on blood sugar goals, less then 130 fasting in morning and <180 two hours after a meal.   - Eye and foot exam up to date. - Repatha on board - Return in 3 months.      Relevant Orders   Bayer DCA Hb A1c Waived     Other   Depression (Chronic)    Chronic, ongoing.  Continue current medication regimen and adjust as needed, maintain current regimen which she reports offers her benefit.  Denies SI/HI. Return to office in 3 months.      Myalgia due to statin    History of myalgia, even with 3 day a week Crestor.  Continue Repatha and  recheck levels today.      Vapes nicotine containing substance    I have recommended complete cessation of tobacco use. I have discussed various options available for assistance with tobacco cessation including over the counter methods (Nicotine gum, patch and lozenges). We also discussed prescription options (Chantix, Nicotine Inhaler / Nasal Spray). The patient is not interested in pursuing any prescription tobacco cessation options at this time.  Determine whether to start lung CT screening annually at age 25.       Other Visit Diagnoses     Abrasion       To left gluteal fold, sent in Mupirocin and recommend increase protein in diet.        Follow up plan: Return in about 3 months (around 02/16/2023) for T2DM, HTN/HLD, DEPRESSION.

## 2022-11-16 NOTE — Assessment & Plan Note (Signed)
History of myalgia, even with 3 day a week Crestor.  Continue Repatha and recheck levels today.

## 2022-11-16 NOTE — Assessment & Plan Note (Signed)
Chronic, stable .  BP well below goal today. Maintain off Losartan.  Discussed benefit of ARB for kidney protection with proteinuria, however at this time will hold off on taking.  Recommend she monitor BP at least a few mornings a week at home and document.  DASH diet at home.  Labs: BMP. Return in 3 months.

## 2022-11-16 NOTE — Assessment & Plan Note (Signed)
Chronic, ongoing.  Continue current medication regimen and adjust as needed, maintain current regimen which she reports offers her benefit.  Denies SI/HI. Return to office in 3 months.

## 2022-11-16 NOTE — Assessment & Plan Note (Signed)
I have recommended complete cessation of tobacco use. I have discussed various options available for assistance with tobacco cessation including over the counter methods (Nicotine gum, patch and lozenges). We also discussed prescription options (Chantix, Nicotine Inhaler / Nasal Spray). The patient is not interested in pursuing any prescription tobacco cessation options at this time.  Determine whether to start lung CT screening annually at age 48.  

## 2022-11-16 NOTE — Assessment & Plan Note (Signed)
Diagnosed December 2021, last  A1c 5.1% today, remains well controlled.  She has lost total of 53 lbs since January, recommend she not lose anymore more weight, but work on strengthening and lots of protein.  Urine ALB 14 April 2022.   - Continue diet focus and exercise. - Stopped Losartan past visit due to orthostatic hypotension episodes with her weight loss. - Educated on blood sugar goals, less then 130 fasting in morning and <180 two hours after a meal.   - Eye and foot exam up to date. - Repatha on board - Return in 3 months.

## 2022-11-17 LAB — LIPID PANEL W/O CHOL/HDL RATIO
Cholesterol, Total: 145 mg/dL (ref 100–199)
HDL: 49 mg/dL (ref >39)
LDL Chol Calc (NIH): 80 mg/dL (ref 0–99)
Triglycerides: 82 mg/dL (ref 0–149)
VLDL Cholesterol Cal: 16 mg/dL (ref 5–40)

## 2022-11-17 LAB — BASIC METABOLIC PANEL
BUN/Creatinine Ratio: 25 — ABNORMAL HIGH (ref 9–23)
BUN: 21 mg/dL (ref 6–24)
CO2: 26 mmol/L (ref 20–29)
Calcium: 9.4 mg/dL (ref 8.7–10.2)
Chloride: 104 mmol/L (ref 96–106)
Creatinine, Ser: 0.84 mg/dL (ref 0.57–1.00)
Glucose: 89 mg/dL (ref 70–99)
Potassium: 4.4 mmol/L (ref 3.5–5.2)
Sodium: 141 mmol/L (ref 134–144)
eGFR: 86 mL/min/{1.73_m2} (ref >59)

## 2022-11-17 NOTE — Progress Notes (Signed)
Contacted via MyChart   Good morning Stefanie Stewart, your labs have returned: - Kidney function, creatinine and eGFR, remains normal, as is liver function, AST and ALT.  - Cholesterol levels remain stable with Repatha on board.  Overall good labs.  Any questions? Keep being stellar!!  Thank you for allowing me to participate in your care.  I appreciate you. Kindest regards, Maryjayne Kleven

## 2022-11-30 ENCOUNTER — Ambulatory Visit: Payer: BC Managed Care – PPO | Admitting: Psychiatry

## 2022-11-30 DIAGNOSIS — F325 Major depressive disorder, single episode, in full remission: Secondary | ICD-10-CM | POA: Diagnosis not present

## 2022-11-30 DIAGNOSIS — F411 Generalized anxiety disorder: Secondary | ICD-10-CM

## 2022-11-30 DIAGNOSIS — E119 Type 2 diabetes mellitus without complications: Secondary | ICD-10-CM

## 2022-11-30 DIAGNOSIS — N951 Menopausal and female climacteric states: Secondary | ICD-10-CM | POA: Diagnosis not present

## 2022-11-30 NOTE — Progress Notes (Signed)
Psychotherapy Progress Note [facility] Marliss Czar, PhD LP  Patient ID: Stefanie Stewart)    MRN: [id number] Therapy format: Individual psychotherapy Date: 11/30/2022      Start: 10:14a     Stop: 11:04a     Time Spent: 50 min Location: In-person   Session narrative (presenting needs, interim history, self-report of stressors and symptoms, applications of prior therapy, status changes, and interventions made in session) Has a date (Les) coming in from [place] tomorrow, looking for something to do.  Recommended the [employer] starting today.    Having a sugar relapse, ordering Insomnia cookies nightly, gained 10 lbs, knows it's still addictive.  First night of abstinence last night, firm plans to continue sugar discipline.  Looking to join Pil[employer], might have found a "menopause buddy" in the teacher there.  Affirmed and encouraged.  Option OA for moral support managing sugar habit.  Has 3 guys she's dating right now, each from [place] area, each with their own appeal.  Working through awkward feelings, and extensive discussion at her request about sexuality and sexual involvement, with Thurston Pounds, the physically very compelling one.  On review, she is being extraordinarily clear about her wishes and boundaries, and it is very clearly consensual, but she is working through multiple anxieties including body image, feeling well out of practice and sexually hungry at the same time, willingness to be touched, and recovering ability to orgasm.  Identified subtle but powerful issue un-learning the compulsive performance behavior she had with her ex, Billy.  Framed messages for herself and for Thurston Pounds, as needed, that they collaborate to understand and pace and anxiety exposure she has while seeking honest thrills together.  Challenged to grant herself full permissions both to enjoy and to wait on various forms of touch, because it's discretion that overcomes feeling victimized, whether  it's by another or oneself.  Expressed full confidence that she will not self herself out again in relationship, nor become desperate just because she experiences some intense and long-frustrated pleasure.  Forecast that she may find herself experiencing an unexpectedly strong breakthrough from tension and anxiety enough to laugh/cry involuntarily, so trust it, too, if that happens.  Therapeutic modalities: Cognitive Behavioral Therapy, Solution-Oriented/Positive Psychology, and Psycho-education/Bibliotherapy  Mental Status/Observations:  Appearance:   Casual     Behavior:  Appropriate and energetic  Motor:  Normal  Speech/Language:   Clear and Coherent  Affect:  Appropriate  Mood:  anxious  Thought process:  normal  Thought content:    WNL  Sensory/Perceptual disturbances:    WNL  Orientation:  Fully oriented  Attention:  Good    Concentration:  Good  Memory:  WNL  Insight:    Good  Judgment:   Good  Impulse Control:  Variable   Risk Assessment: Danger to Self: No Self-injurious Behavior: No Danger to Others: No Physical Aggression / Violence: No Duty to Warn: No Access to Firearms a concern: No  Assessment of progress:  progressing well  Diagnosis:   ICD-10-CM   1. Generalized anxiety disorder  F41.1     2. Type 2 diabetes mellitus without complication, without long-term current use of insulin (HCC)  E11.9    r/o carb addiction, typically under good control    3. 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 Menopause syndrome  N95.1      Plan:  [employer] business -- Wrap up loose ends, if any Health -- Continue good diabetes care,  reasonable diet & exercise, fade aggressive weight loss plan to more normal diet with control.  Recommend ongoing carb control for emotional equilibrium and menopause support.  Option OA if in search of support and help mastering compulsive eating. Occupational stress -- Continue integrating into the new work  environment.  Try to worry less what people think of her and bring her own peace of mind to interactions.  Continue picking battles about features of the work environment or process to challenge.  Family dynamics -- Continue communication advice with intrusive or otherwise pressuring family/friends Self-soothing techniques -- Mindful minute, listen to ambient noise (clock, air vent), try listening to a metronome app and playing with the tempo Worry/catastrophizing -- Continue letting people draw their own conclusions, explain changes and attitudes at discretion.  For safety fear scenarios, try to see deterrents to predatory crime from the predator's perspective, esp how deterred they would be by measures taken. Social -- Endorse exploring dating, as interested.  If any sexual response and readiness concerns that are suspected hormonal, see gynecologist or PC.  Emotional and psychological issues above. Other recommendations/advice -- As may be noted above.  Continue to utilize previously learned skills ad lib. Medication compliance -- Maintain medication as prescribed and work faithfully with relevant prescriber(s) if any changes are desired or seem indicated. Crisis service -- Aware of call list and work-in appts.  Call the clinic on-call service, 988/hotline, 911, or present to [facility] or ER if any life-threatening psychiatric crisis. Followup -- Return for time as already scheduled.  Next scheduled visit with me 12/28/2022.  Next scheduled in this office 12/28/2022.  Robley Fries, PhD Marliss Czar, PhD LP Clinical Psychologist, [facility] [address] [place], [place] [address] [phone]

## 2022-12-25 ENCOUNTER — Ambulatory Visit: Payer: BC Managed Care – PPO | Admitting: Psychiatry

## 2022-12-25 DIAGNOSIS — R6889 Other general symptoms and signs: Secondary | ICD-10-CM | POA: Diagnosis not present

## 2022-12-25 DIAGNOSIS — F411 Generalized anxiety disorder: Secondary | ICD-10-CM | POA: Diagnosis not present

## 2022-12-25 DIAGNOSIS — F325 Major depressive disorder, single episode, in full remission: Secondary | ICD-10-CM | POA: Diagnosis not present

## 2022-12-25 DIAGNOSIS — N951 Menopausal and female climacteric states: Secondary | ICD-10-CM

## 2022-12-25 NOTE — Progress Notes (Signed)
Psychotherapy Progress Note [facility] Marliss Czar, PhD LP  Patient ID: Stefanie Stewart)    MRN: [id number] Therapy format: Individual psychotherapy Date: 12/25/2022      Start: 2:20p     Stop: 2:40p     Time Spent: 30 min (incl. 2 phone calls past week) Location: Telehealth visit -- I connected with this patient by an approved telecommunication method (audio only), with her informed consent, and verifying identity and patient privacy.  I was located at my office and patient at her car.  As needed, we discussed the limitations, risks, and security and privacy concerns associated with telehealth service, including the availability and conditions which currently govern in-person appointments and the possibility that 3rd-party payment may not be fully guaranteed and she may be responsible for charges.  After she indicated understanding, we proceeded with the session.  Also discussed treatment planning, as needed, including ongoing verbal agreement with the plan, the opportunity to ask and answer all questions, her demonstrated understanding of instructions, and her readiness to call the office should symptoms worsen or she feels she is in a crisis state and needs more immediate and tangible assistance.   Session narrative (presenting needs, interim history, self-report of stressors and symptoms, applications of prior therapy, status changes, and interventions made in session) TC from Pt Friday seeking urgent advice handling communication situation with Thurston Pounds.  Has a thrilling sexual encounter, on the terms she proposed, that they be sex partners without obligation, but now feeling he's "ghosting" her by taking too long to reply to messages.  Friends and Facebook "experts" say don't text him back, it's desperate, or he's "obviously" not interested, and other conclusion-jumping.  Between her own intolerance of uncertainty and wariness of getting hurt (or an STD), her strong desire  to be sexually active again, and these people's cynical conclusion-jumping about the rules of modern dating, feels like she's frying.  Reviewed her impressions and messages, advised that she's putting too much pressure on the results, that he is having to learn how to work this kind of relationship, same as her, and it's not nearly the threat of a seriously wrong move as she's being told.  OK to text him for low-pressure clarification, e.g., "It's been a couple days -- anything you want me to know?" and give it to Monday.  Either way, emphasize being OK with it whether he answers good, answers bad, or quiet longer.  TC today seeking urgent guidance again, returned a callback message just 5 minutes before a scheduled patient.  Spoke briefly, contracted for a phone session close to 2pm.  RTC as further scheduled, talked over her quandary further.  Thurston Pounds responded quickly on Friday, which was encouraging, to let her know it's hectic, has some personal business, and she knows his daughter is in town.  Nothing since, so she texted again yesterday, low key in words but very anxious internally.  On the face of it, sounds like his message was still reasonable and communicative, but again her girlfriends are saying he's just ghosting her and if she sends anything further she's acing desperate.  Again advocated for her discretion, resist jumping to any hostile conclusions, and let her own wishes be her guide -- if she wants to move on, move on, if she wants to hang on, be patient, but resist letting other voices exaggerate or overinterpret this because he sounds honest, he does have more going on in his life than a hookup partner, and it  stand to reason he is just not as johnny on the spot as she is about replying.  At her insistence, framed a brief, low pressure message for today, offering time Thursday, basically because she is eager to get back to sex, while also scared of the prospect of moving on from him and driving up  her risk of STDs.  Makes some sense to her that friends are trying to apply "boyfriend" rules to a "business" relationship.  Agreed to return to the subject Friday  Therapeutic modalities: Cognitive Behavioral Therapy, Solution-Oriented/Positive Psychology, and Assertiveness/Communication  Mental Status/Observations:  Appearance:   Not assessed     Behavior:  Appropriate  Motor:  Not assessed  Speech/Language:   Clear and Coherent and Pressured  Affect:  Not assessed  Mood:  anxious  Thought process:  normal  Thought content:    Rumination  Sensory/Perceptual disturbances:    WNL  Orientation:  Fully oriented  Attention:  Good    Concentration:  Good  Memory:  WNL  Insight:    Good  Judgment:   Good  Impulse Control:  Fair   Risk Assessment: Danger to Self: No Self-injurious Behavior: No Danger to Others: No Physical Aggression / Violence: No Duty to Warn: No Access to Firearms a concern: No  Assessment of progress:  stabilized  Diagnosis:   ICD-10-CM   1. Generalized anxiety disorder  F41.1     2. 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 Menopause syndrome  N95.1     4. Increased libido  R68.89      Plan:  As above Other recommendations/advice -- As may be noted above.  Continue to utilize previously learned skills ad lib. Medication compliance -- Maintain medication as prescribed and work faithfully with relevant prescriber(s) if any changes are desired or seem indicated. Crisis service -- Aware of call list and work-in appts.  Call the clinic on-call service, 988/hotline, 911, or present to [facility] or ER if any life-threatening psychiatric crisis. Followup -- Return for time as already scheduled.  Next scheduled visit with me 12/28/2022.  Next scheduled in this office 12/28/2022.  Robley Fries, PhD Marliss Czar, PhD LP Clinical Psychologist, [facility] [address],  [address] [place], [place] [address] [phone]

## 2022-12-28 ENCOUNTER — Ambulatory Visit: Payer: BC Managed Care – PPO | Admitting: Psychiatry

## 2022-12-28 DIAGNOSIS — N951 Menopausal and female climacteric states: Secondary | ICD-10-CM | POA: Diagnosis not present

## 2022-12-28 DIAGNOSIS — F325 Major depressive disorder, single episode, in full remission: Secondary | ICD-10-CM | POA: Diagnosis not present

## 2022-12-28 DIAGNOSIS — R6889 Other general symptoms and signs: Secondary | ICD-10-CM

## 2022-12-28 DIAGNOSIS — F411 Generalized anxiety disorder: Secondary | ICD-10-CM | POA: Diagnosis not present

## 2022-12-28 NOTE — Progress Notes (Signed)
Psychotherapy Progress Note [facility] Marliss Czar, PhD LP  Patient ID: Stefanie Stewart)    MRN: [id number] Therapy format: Individual psychotherapy Date: 12/28/2022      Start: 10:14a     Stop: 11:00a     Time Spent: 46 min Location: In-person   Session narrative (presenting needs, interim history, self-report of stressors and symptoms, applications of prior therapy, status changes, and interventions made in session) Disappointed with Thurston Pounds.  Did get results from Tuesday's low-key text, got a call back that night, learned he is occupied with a child situation, but still doesn't quite trust that it's complete or will work out.  Reinforced the understanding that am exclusive casual sex relationship may still be foreign to her and Thurston Pounds both, and it will be important to make sure she adjusts expectations for responsiveness to text messages.  Other advice sought about responding to [place] is not that interesting to her.    Re health, is actively working out, looking to be ready for an 8-mile event this fall.   Affirmed and encouraged.  Seems to have control back over carb compulsions.  Re. 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  Discussed options for communicating her needs and asking after what to expect.  Therapeutic modalities: Cognitive Behavioral Therapy, Solution-Oriented/Positive Psychology, Ego-Supportive, and Assertiveness/Communication  Mental Status/Observations:  Appearance:   Casual     Behavior:  Appropriate  Motor:  Restlestness  Speech/Language:   Clear and Coherent  Affect:  Appropriate  Mood:  anxious  Thought process:  normal  Thought content:    WNL  Sensory/Perceptual disturbances:    WNL  Orientation:  Fully oriented  Attention:  Good    Concentration:  Fair  Memory:  WNL  Insight:    Good  Judgment:   Good  Impulse Control:  Variable    Risk Assessment: Danger to Self: No Self-injurious Behavior: No Danger to Others: No Physical Aggression / Violence: No Duty to Warn: No Access to Firearms a concern: No  Assessment of progress:  stabilized  Diagnosis:   ICD-10-CM   1. Generalized anxiety disorder  F41.1     2. 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 Menopause syndrome  N95.1     4. 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  Recommend ongoing carb control for emotional equilibrium and menopause support.  Option OA if in search of support and help mastering compulsive eating. Occupational stress -- Continue integrating into the new work environment.  Try to worry less what people think of her and bring her own peace of mind to interactions.  Continue picking battles about features of the work environment or process to challenge.  Family dynamics -- Continue communication advice with intrusive or otherwise pressuring family/friends Self-soothing techniques -- Mindful minute, listen to ambient noise (clock, air vent), try listening to a metronome app and playing with the tempo Worry/catastrophizing -- Continue letting people draw their own conclusions, explain changes and attitudes at discretion.  For safety fear scenarios, try to see deterrents to predatory crime from the predator's perspective, esp how deterred they would be by measures taken. Dating --  Endorse exploring dating as interested.  For any suspected hormonal issues, see gynecologist or PC.  Practice suspending judgment and willingness to check perceptions in emerging relationships. Other recommendations/advice -- As may be noted above.  Continue to utilize previously learned skills ad lib. Medication compliance -- Maintain medication as prescribed and  work faithfully with relevant prescriber(s) if any changes are desired or seem indicated. Crisis service -- Aware of call list and work-in appts.  Call the clinic on-call service, 988/hotline, 911, or present to [facility] or ER if any life-threatening psychiatric crisis. Followup -- No follow-ups on file.  Next scheduled visit with me 01/25/2023.  Next scheduled in this office 01/25/2023.  Robley Fries, PhD Marliss Czar, PhD LP Clinical Psychologist, [facility] [address], [address] [place], [place] [address] [phone]

## 2023-01-25 ENCOUNTER — Ambulatory Visit: Payer: BC Managed Care – PPO | Admitting: Psychiatry

## 2023-01-25 DIAGNOSIS — F325 Major depressive disorder, single episode, in full remission: Secondary | ICD-10-CM

## 2023-01-25 DIAGNOSIS — F411 Generalized anxiety disorder: Secondary | ICD-10-CM

## 2023-01-25 DIAGNOSIS — E119 Type 2 diabetes mellitus without complications: Secondary | ICD-10-CM

## 2023-01-25 DIAGNOSIS — Z609 Problem related to social environment, unspecified: Secondary | ICD-10-CM | POA: Diagnosis not present
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
## 2023-02-08 ENCOUNTER — Ambulatory Visit: Payer: BC Managed Care – PPO | Admitting: Family Medicine

## 2023-02-08 VITALS — BP 146/86 | HR 78 | Temp 98.1°F | Ht 67.72 in | Wt 167.0 lb

## 2023-02-08 DIAGNOSIS — T7840XA Allergy, unspecified, initial encounter: Secondary | ICD-10-CM | POA: Diagnosis not present

## 2023-02-08 DIAGNOSIS — R21 Rash and other nonspecific skin eruption: Secondary | ICD-10-CM | POA: Insufficient documentation

## 2023-02-08 NOTE — Progress Notes (Signed)
BP (!) 146/86   Pulse 78   Temp 98.1 F (36.7 C) (Oral)   Ht 5' 7.72" (1.72 m)   Wt 167 lb (75.8 kg)   SpO2 98%   BMI 25.60 kg/m    Subjective:    Patient ID: Stefanie Stewart, female    DOB: Jan 06, 1976, 47 y.o.   MRN: [id number]  HPI: Stefanie Stewart is a 47 y.o. female  Chief Complaint  Patient presents with   Rash    On face, ongoing now for 2 months   RASH She has tried changing her mask at work and makeup after rash appeared, no relief. She believes it may be contributed to her significant others mustache and beard cream that he uses, admits to irritation from it. She denies changes in soaps, makeup, and lotions before the rash appeared.  Duration: 1  months  Location: left upper outer lip and right upper outer lip Itching: yes Burning: no Redness: yes Oozing: no Scaling: yes Blisters: no Painful: no tender Fevers: no Change in detergents/soaps/personal care products: no Recent illness: no Recent travel:no History of same: no Context: stable Alleviating factors: hydrocortisone cream and lotion/moisturizer Treatments attempted: Cortizone 10, cool aloe, and blistex and lotion/moisturizer Shortness of breath: no  Throat/tongue swelling: no Myalgias/arthralgias: no   Relevant past medical, surgical, family and social history reviewed and updated as indicated. Interim medical history since our last visit reviewed. Allergies and medications reviewed and updated.  Review of Systems  Constitutional:  Negative for fever.  HENT: Negative.    Respiratory: Negative.    Cardiovascular: Negative.   Skin:  Positive for rash (Erythema of left upper outer lip right upper outer lip).    Per HPI unless specifically indicated above     Objective:    BP (!) 146/86   Pulse 78   Temp 98.1 F (36.7 C) (Oral)   Ht 5' 7.72" (1.72 m)   Wt 167 lb (75.8 kg)   SpO2 98%   BMI 25.60 kg/m   Wt Readings from Last 3 Encounters:  02/08/23 167 lb (75.8 kg)  11/16/22 151 lb  9.6 oz (68.8 kg)  07/27/22 155 lb 1.6 oz (70.4 kg)    Physical Exam Vitals and nursing note reviewed.  Constitutional:      General: She is awake. She is not in acute distress.    Appearance: Normal appearance. She is well-developed and well-groomed. She is not ill-appearing.  HENT:     Head: Normocephalic and atraumatic.     Right Ear: Hearing and external ear normal. No drainage.     Left Ear: Hearing and external ear normal. No drainage.     Nose: Nose normal.  Eyes:     General: Lids are normal.        Right eye: No discharge.        Left eye: No discharge.     Conjunctiva/sclera: Conjunctivae normal.  Cardiovascular:     Rate and Rhythm: Normal rate and regular rhythm.     Pulses:          Radial pulses are 2+ on the right side and 2+ on the left side.       Posterior tibial pulses are 2+ on the right side and 2+ on the left side.     Heart sounds: Normal heart sounds, S1 normal and S2 normal. No murmur heard.    No gallop.  Pulmonary:     Effort: Pulmonary effort is normal. No accessory muscle usage  or respiratory distress.     Breath sounds: Normal breath sounds. No wheezing, rhonchi or rales.  Musculoskeletal:        General: Normal range of motion.     Cervical back: Full passive range of motion without pain and normal range of motion.     Right lower leg: No edema.     Left lower leg: No edema.  Skin:    General: Skin is warm and dry.     Capillary Refill: Capillary refill takes less than 2 seconds.     Findings: Erythema present. No rash.     Comments: left upper outer lip right upper outer lip  Neurological:     Mental Status: She is alert and oriented to person, place, and time.  Psychiatric:        Attention and Perception: Attention normal.        Mood and Affect: Mood normal.        Speech: Speech normal.        Behavior: Behavior normal. Behavior is cooperative.        Thought Content: Thought content normal.     Results for orders placed or performed  in visit on 11/16/22  Bayer DCA Hb A1c Waived  Result Value Ref Range   HB A1C (BAYER DCA - WAIVED) 5.1 4.8 - 5.6 %  Basic metabolic panel  Result Value Ref Range   Glucose 89 70 - 99 mg/dL   BUN 21 6 - 24 mg/dL   Creatinine, Ser 4.09 0.57 - 1.00 mg/dL   eGFR 86 >81 XB/JYN/8.29   BUN/Creatinine Ratio 25 (H) 9 - 23   Sodium 141 134 - 144 mmol/L   Potassium 4.4 3.5 - 5.2 mmol/L   Chloride 104 96 - 106 mmol/L   CO2 26 20 - 29 mmol/L   Calcium 9.4 8.7 - 10.2 mg/dL  Lipid Panel w/o Chol/HDL Ratio  Result Value Ref Range   Cholesterol, Total 145 100 - 199 mg/dL   Triglycerides 82 0 - 149 mg/dL   HDL 49 >56 mg/dL   VLDL Cholesterol Cal 16 5 - 40 mg/dL   LDL Chol Calc (NIH) 80 0 - 99 mg/dL      Assessment & Plan:   Problem List Items Addressed This Visit     Hypersensitivity - Primary    Acute, stable. Erythema to outer upper lip, thinks this may be contributed from significant other beard exposure. Advised to try protective barrier such as Aquaphor or vaseline for protection to the area, avoid exposure of beard cream that significant other may use. Return if symptoms fail to improve.         Follow up plan: Return if symptoms worsen or fail to improve.

## 2023-02-08 NOTE — Patient Instructions (Signed)
Try barrier ointment over area; Vaseline for protection Aquaphor ointment

## 2023-02-08 NOTE — Assessment & Plan Note (Signed)
Acute, stable. Erythema to outer upper lip, thinks this may be contributed from significant other beard exposure. Advised to try protective barrier such as Aquaphor or vaseline for protection to the area, avoid exposure of beard cream that significant other may use. Return if symptoms fail to improve.

## 2023-02-20 DIAGNOSIS — Z1159 Encounter for screening for other viral diseases: Secondary | ICD-10-CM | POA: Diagnosis not present

## 2023-02-20 DIAGNOSIS — N760 Acute vaginitis: Secondary | ICD-10-CM | POA: Diagnosis not present

## 2023-02-20 DIAGNOSIS — B3731 Acute candidiasis of vulva and vagina: Secondary | ICD-10-CM | POA: Diagnosis not present

## 2023-02-20 DIAGNOSIS — Z114 Encounter for screening for human immunodeficiency virus [HIV]: Secondary | ICD-10-CM | POA: Diagnosis not present

## 2023-02-20 DIAGNOSIS — Z118 Encounter for screening for other infectious and parasitic diseases: Secondary | ICD-10-CM | POA: Diagnosis not present

## 2023-02-20 DIAGNOSIS — Z113 Encounter for screening for infections with a predominantly sexual mode of transmission: Secondary | ICD-10-CM | POA: Diagnosis not present

## 2023-03-08 ENCOUNTER — Ambulatory Visit: Payer: BC Managed Care – PPO | Admitting: Psychiatry

## 2023-03-08 ENCOUNTER — Ambulatory Visit: Payer: BC Managed Care – PPO | Admitting: Nurse Practitioner

## 2023-03-08 DIAGNOSIS — F325 Major depressive disorder, single episode, in full remission: Secondary | ICD-10-CM | POA: Diagnosis not present

## 2023-03-08 DIAGNOSIS — F411 Generalized anxiety disorder: Secondary | ICD-10-CM | POA: Diagnosis not present

## 2023-03-08 DIAGNOSIS — E119 Type 2 diabetes mellitus without complications: Secondary | ICD-10-CM

## 2023-03-08 DIAGNOSIS — Z609 Problem related to social environment, unspecified: Secondary | ICD-10-CM

## 2023-03-08 NOTE — Progress Notes (Signed)
Psychotherapy Progress Note [facility] Stefanie Czar, PhD LP  Patient ID: Stefanie Stewart)    MRN: [id number] Therapy format: Individual psychotherapy Date: 03/08/2023      Start: 10:13a     Stop: 11:00a     Time Spent: 47 min Location: Telehealth visit -- I connected with this patient by an approved telecommunication method (audio only), with her informed consent, and verifying identity and patient privacy.  I was located at my office and patient at her workplace.  As needed, we discussed the limitations, risks, and security and privacy concerns associated with telehealth service, including the availability and conditions which currently govern in-person appointments and the possibility that 3rd-party payment may not be fully guaranteed and she may be responsible for charges.  After she indicated understanding, we proceeded with the session.  Also discussed treatment planning, as needed, including ongoing verbal agreement with the plan, the opportunity to ask and answer all questions, her demonstrated understanding of instructions, and her readiness to call the office should symptoms worsen or she feels she is in a crisis state and needs more immediate and tangible assistance.   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  Focus is anxiety over her relationship, which is unmistakably positive and warm, but he's giving gifts and extra reassurance in a way that tend to feed her anxiety.  Interpreted that it is most likely still the anxiety of getting vulnerable again, not a reflection of his character, but she is always free to ask for clarifications of his motives.  And it's OK to ask him to slow down and let her think, or get used to an idea, or beg off entirely, at her  discretion.  Personally, finds her sugar jones coming back.  Put 20 lbs back on.  Explained as most likely self-medicating for increased tension and uncertainty.  Recommended she inventory a few things she can do to soothe or vent anxiety first, and use it as a self-reminder for ways of self-soothing before resorting to food/carbs.  Another issue of concern is ED on Stefanie Stewart's part.  Admittedly getting better already but made her wonder if he had conflicting feelings toward her.  Objectively, he praises her to the hilt and considers her super-sexy, which takes some getting used to in its own right, but seems clear enough no, ED is what it is physically and emotionally within the man in question, not a nonverbal judgment of her.  Says Stefanie Stewart smokes and has a bourbon habit, and though he does not get sloppy, mouthy, or mean, it could also affect sexual functioning.  Discussed a couple of explicit issues in sexual mechanics, coaching her to keep working through anxieties together, by communicating what works and reinforcing permission to experiment and get feedback.  On reflection, reinforced how they already seem to be working some productive sex therapy together and encouraged in future collaboration.  Therapeutic modalities: Cognitive Behavioral Therapy, Solution-Oriented/Positive Psychology, and Ego-Supportive  Mental Status/Observations:  Appearance:   Not assessed     Behavior:  Appropriate  Motor:  Not assessed  Speech/Language:   WNL  Affect:  Not assessed  Mood:  anxious and happier  Thought process:  normal  Thought content:    WNL  Sensory/Perceptual disturbances:    WNL  Orientation:  Fully oriented  Attention:  Good    Concentration:  Good  Memory:  WNL  Insight:    Good  Judgment:   Good  Impulse Control:  Good   Risk Assessment: Danger to Self: No Self-injurious Behavior: No Danger to Others: No Physical Aggression / Violence: No Duty to Warn: No Access to Firearms a concern:  No  Assessment of progress:  progressing  Diagnosis:   ICD-10-CM   1. Generalized anxiety disorder  F41.1     2. 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 Type 2 diabetes mellitus without complication, without long-term current use of insulin (HCC)  E11.9     4. 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  Recommend ongoing carb control for emotional equilibrium and menopause support.  Option OA if in search of support and help mastering compulsive eating. Occupational stress -- Continue integrating into the new work environment.  Try to worry less what people think of her and bring her own peace of mind to interactions.  Continue picking battles about features of the work environment or process to challenge.  Family dynamics -- Continue communication advice with intrusive or otherwise pressuring family/friends Self-soothing techniques -- Mindful minute, listen to ambient noise (clock, air vent), try listening to a metronome app and playing with the tempo Worry/catastrophizing -- Continue letting people draw their own conclusions, explain changes and attitudes at discretion.  For safety fear scenarios, try to see deterrents to predatory crime from the predator's perspective, esp how deterred they would be by measures taken. Dating -- Endorse dating as interested, and be ready to recognize anxiety about vulnerability as separate from reasons to doubt her counterpart.  For any suspected hormonal issues, see gynecologist or PC.  Practice suspending snap judgments, and willingness to check perceptions, in any emerging relationships, both as good practice and good for setting norms should the relationship last. Other recommendations/advice -- As may be noted above.   Continue to utilize previously learned skills ad lib. Medication compliance -- Maintain medication as prescribed and work faithfully with relevant prescriber(s) if any changes are desired or seem indicated. Crisis service -- Aware of call list and work-in appts.  Call the clinic on-call service, 988/hotline, 911, or present to [facility] or ER if any life-threatening psychiatric crisis. Followup -- Return for time as already scheduled.  Next scheduled visit with me 04/12/2023.  Next scheduled in this office 04/12/2023.  Robley Fries, PhD Stefanie Czar, PhD LP Clinical Psychologist, [facility] [address] [place], [place] [address] [phone]

## 2023-03-12 ENCOUNTER — Telehealth: Payer: Self-pay | Admitting: Nurse Practitioner

## 2023-03-12 NOTE — Telephone Encounter (Signed)
Copied from CRM [id number]. Topic: General - Other >> Mar 12, 2023  3:08 PM Turkey B wrote: Reason for CRM: pt wants to know if she will have blood work done at her next appt, rescheduled for February

## 2023-03-12 NOTE — Telephone Encounter (Signed)
Returned call to patient and let her know she does have upcoming pended labs for her next appointment.

## 2023-03-15 ENCOUNTER — Ambulatory Visit: Payer: BC Managed Care – PPO | Admitting: Nurse Practitioner

## 2023-03-15 ENCOUNTER — Telehealth (INDEPENDENT_AMBULATORY_CARE_PROVIDER_SITE_OTHER): Payer: BC Managed Care – PPO | Admitting: Pediatrics

## 2023-03-15 DIAGNOSIS — E1129 Type 2 diabetes mellitus with other diabetic kidney complication: Secondary | ICD-10-CM

## 2023-03-15 DIAGNOSIS — Z23 Encounter for immunization: Secondary | ICD-10-CM

## 2023-03-15 DIAGNOSIS — J069 Acute upper respiratory infection, unspecified: Secondary | ICD-10-CM | POA: Diagnosis not present

## 2023-03-15 DIAGNOSIS — T466X5A Adverse effect of antihyperlipidemic and antiarteriosclerotic drugs, initial encounter: Secondary | ICD-10-CM

## 2023-03-15 DIAGNOSIS — E1169 Type 2 diabetes mellitus with other specified complication: Secondary | ICD-10-CM

## 2023-03-15 DIAGNOSIS — Z720 Tobacco use: Secondary | ICD-10-CM

## 2023-03-15 DIAGNOSIS — F320 Major depressive disorder, single episode, mild: Secondary | ICD-10-CM

## 2023-03-15 DIAGNOSIS — I152 Hypertension secondary to endocrine disorders: Secondary | ICD-10-CM

## 2023-03-15 DIAGNOSIS — N921 Excessive and frequent menstruation with irregular cycle: Secondary | ICD-10-CM | POA: Diagnosis not present

## 2023-03-15 DIAGNOSIS — R7401 Elevation of levels of liver transaminase levels: Secondary | ICD-10-CM

## 2023-03-15 MED ORDER — AZITHROMYCIN 250 MG PO TABS
ORAL_TABLET | ORAL | 0 refills | Status: AC
Start: 2023-03-15 — End: 2023-03-20

## 2023-03-15 MED ORDER — ALBUTEROL SULFATE HFA 108 (90 BASE) MCG/ACT IN AERS: 1.0000 | INHALATION_SPRAY | Freq: Four times a day (QID) | RESPIRATORY_TRACT | 2 refills | Status: DC | PRN

## 2023-03-15 NOTE — Progress Notes (Unsigned)
Telehealth Visit  I connected with  Stefanie Stewart on 03/16/23 by a video enabled telemedicine application and verified that I am speaking with the correct person using two identifiers.   I discussed the limitations of evaluation and management by telemedicine. The patient expressed understanding and agreed to proceed.  Subjective:    Patient ID: Stefanie Stewart, female    DOB: 1975/08/04, 47 y.o.   MRN: [id number]  HPI: Stefanie Stewart is a 47 y.o. female  Chief Complaint  Patient presents with   URI    Pressure in head, chest pressure no pain, chest is tight and phelmgy. Runny nose, congestion, worse overnight and in the am. Mucinex is helping some but not much. Phlegm is green. Sore throat.     Discussed the use of AI scribe software for clinical note transcription with the patient, who gave verbal consent to proceed.  History of Present Illness   The patient, with a history of perimenopause, presents with a week-long history of upper respiratory symptoms, characterized by congestion and productive cough with green, thick sputum. The symptoms have worsened over the past day, with the cough being more severe at night and in the morning. The patient has been self-managing with over-the-counter medications including Sudafed, Mucinex DM, and elderberry drops with zinc and vitamin C, as well as hot tea with honey. Despite these measures, the patient reports persistent symptoms, particularly a sensation of chest congestion and colored sputum production. The patient denies any fever.  The patient also reports a history of wheezing, particularly in the early morning hours, and has been using an albuterol inhaler as needed. The patient denies any other respiratory symptoms.  The patient is also on a low-dose birth control medication and has been managing perimenopausal symptoms with a supplement called "calm." The patient expresses concern about potential interactions between the birth control  and prescribed antibiotics.  The patient's primary concern is the potential for contagion and the possibility of having infected others. The patient is also concerned about the color of the sputum and whether it indicates an infection.      Relevant past medical, surgical, family and social history reviewed and updated as indicated. Interim medical history since our last visit reviewed. Allergies and medications reviewed and updated.  ROS per HPI unless specifically indicated above     Objective:    There were no vitals taken for this visit.  Wt Readings from Last 3 Encounters:  02/08/23 167 lb (75.8 kg)  11/16/22 151 lb 9.6 oz (68.8 kg)  07/27/22 155 lb 1.6 oz (70.4 kg)     Physical Exam Constitutional:      General: She is not in acute distress.    Appearance: Normal appearance. She is not toxic-appearing.  HENT:     Nose: Congestion present.  Neurological:     General: No focal deficit present.     Mental Status: She is alert. Mental status is at baseline.     LIMITED EXAM GIVEN VIDEO VISIT     Assessment & Plan:  Assessment & Plan   Upper respiratory tract infection, unspecified type Persistent symptoms for over a week with green phlegm and chest congestion. Suspected atypical pneumonia due to the duration and nature of symptoms. -Start Azithromycin (Z-Pak) today. -Continue over-the-counter treatments including Mucinex DM, Sudafed, elderberry drops with zinc, vitamin C, and hot tea with honey. -Consider additional home remedies such as Vicks Vapor Rub, steam inhalation, and honey for its anti-inflammatory properties. -If no improvement  by Monday, consider broadening antibiotic coverage. -     Azithromycin; Take 2 tablets on day 1, then 1 tablet daily on days 2 through 5  Dispense: 6 tablet; Refill: 0 -     Albuterol Sulfate HFA; Inhale 1 puff into the lungs every 6 (six) hours as needed for wheezing or shortness of breath.  Dispense: 8 g; Refill: 2    Follow up  plan: Return if symptoms worsen or fail to improve.  Alizon Schmeling P Erico Stan, MD   This visit was completed via video visit through MyChart due to the restrictions of the COVID-19 pandemic. All issues as above were discussed and addressed. Physical exam was done as above through visual confirmation on video through MyChart. If it was felt that the patient should be evaluated in the office, they were directed there. The patient verbally consented to this visit."} Location of the patient: work Location of the provider: home Those involved with this call:  Provider: Modena Nunnery, MD CMA: Babs Bertin, CMA Time spent on call:  15 minutes with patient face to face via video conference. More than 50% of this time was spent in counseling and coordination of care. 15 minutes total spent in review of patient's record and preparation of their chart. Total time spent on this encounter: 30 minutes.

## 2023-03-15 NOTE — Patient Instructions (Addendum)
Start z pack for suspected atypical pneumonia I also sent albuterol for those moments or worsened shortness of breath or chest tightness  Most cold symptoms last up to 2 weeks, but cough can sometimes linger up to 4 weeks.  However if your symtpoms get WORSE - like you develop fevers or get more shortness of breath, then call your clinic as you may need to be evaluated.   Aches and Pains Acetaminophen (Tylenol): 1000mg  ("extra strength" tablets are 500mg , so take 2) every 8 hours if needed  Ibuprofen (Advil/Motrin) 400-800mg  (comes in 200mg  pills OTC, so 2-4 pills) every 8 hours.   Sore Throat:  See Aches and Pains meds above, also Sore throat sprays and lozenges may also help.   Cough:  Honey 2 TBS every 4-6 hours if needed.  Robitussin DM syrup or generic equivalent which has (guaifenesin = an expectorant to help you get stuff up + dextromethorphan (DM) = cough supressant). You can also get this in tablet formula (like Mucinex DM or generic equivalent).  If you have asthma or are wheezing and have a tight chest, then albuterol inhaler (Ventolin, ProAir) may be helpful - you need a prescription for this.   Congestion:  oxymetazoline (Afrin) nasal stray: 2 sprays each nostril every 12 hours. Don't use more than 3 days in a row to avoid building a tolerance to it.  Sinus rinse (neti pot) high volume sinus rinse can help open up your sinuses and be helpful, especially if you're having sinus pressure and headaches.   Other:  Umcka (pelargonium sidoides extract) can to shorten cold symptoms (can be hard to find, but [employer] carries it: brand name Umcka ColdCare from [employer]). Works best if you start taking at earliest signs of cold symptoms.  Andrographis paniculata is another herbal remedy with less evidence, but may reduce common cold symptoms in adults.  zinc acetate lozenges >= 80 mg/day reduces duration but not severity of cold symptoms in adults, but it is associated with bad  taste and nausea Heated humidified air may reduce cold symptoms, so try using a humidifier - especially in your bedroom at night.  Stay hydrated! Aim to drink at least 2 liters of water daily.   What doesn't work (but lots of folks think might) Vitamin C: bummer right?! But there's no evidence that high dose vitamin C will help cold symptoms.

## 2023-03-16 ENCOUNTER — Encounter: Payer: Self-pay | Admitting: Pediatrics

## 2023-04-09 ENCOUNTER — Other Ambulatory Visit: Payer: Self-pay | Admitting: Nurse Practitioner

## 2023-04-10 ENCOUNTER — Telehealth: Payer: Self-pay

## 2023-04-10 NOTE — Telephone Encounter (Signed)
 Requested Prescriptions  Pending Prescriptions Disp Refills   buPROPion  (WELLBUTRIN  XL) 150 MG 24 hr tablet [Pharmacy Med Name: buPROPion  HCl ER (XL) 150 MG Oral Tablet Extended Release 24 Hour] 180 tablet 0    Sig: Take 1 tablet by mouth once daily     Psychiatry: Antidepressants - bupropion  Failed - 04/10/2023 10:05 AM      Failed - ALT in normal range and within 360 days    ALT  Date Value Ref Range Status  07/27/2022 51 (H) 0 - 32 IU/L Final         Failed - Last BP in normal range    BP Readings from Last 1 Encounters:  02/08/23 (!) 146/86         Passed - Cr in normal range and within 360 days    Creat  Date Value Ref Range Status  05/14/2014 0.70 0.50 - 1.10 mg/dL Final   Creatinine, Ser  Date Value Ref Range Status  11/16/2022 0.84 0.57 - 1.00 mg/dL Final         Passed - AST in normal range and within 360 days    AST  Date Value Ref Range Status  07/27/2022 20 0 - 40 IU/L Final         Passed - Completed PHQ-2 or PHQ-9 in the last 360 days      Passed - Valid encounter within last 6 months    Recent Outpatient Visits           3 weeks ago Upper respiratory tract infection, unspecified type   [facility] Hadassah Letters, MD   2 months ago Hypersensitivity, initial encounter   [facility] Aguila, Basilia Lima, NP   4 months ago Type 2 diabetes mellitus with proteinuria (HCC)   [facility], [place] T, NP   8 months ago Type 2 diabetes mellitus with proteinuria (HCC)   [place] [place], Sanjuan Crumbly T, NP   1 year ago Type 2 diabetes mellitus with proteinuria (HCC)   [facility] [place], Lavelle Posey, NP       Future Appointments             In 1 month [name], Lavelle Posey, NP [facility]

## 2023-04-10 NOTE — Telephone Encounter (Signed)
 PA for Repatha  initiated and submitted via Cover My Meds. Key: [id number]

## 2023-04-11 NOTE — Telephone Encounter (Signed)
PA approved. Patient notified via mychart

## 2023-04-12 ENCOUNTER — Ambulatory Visit: Payer: BC Managed Care – PPO | Admitting: Psychiatry

## 2023-04-12 DIAGNOSIS — E1169 Type 2 diabetes mellitus with other specified complication: Secondary | ICD-10-CM

## 2023-04-12 DIAGNOSIS — F325 Major depressive disorder, single episode, in full remission: Secondary | ICD-10-CM | POA: Diagnosis not present

## 2023-04-12 DIAGNOSIS — F411 Generalized anxiety disorder: Secondary | ICD-10-CM | POA: Diagnosis not present

## 2023-04-12 DIAGNOSIS — R635 Abnormal weight gain: Secondary | ICD-10-CM

## 2023-04-12 DIAGNOSIS — Z599 Problem related to housing and economic circumstances, unspecified: Secondary | ICD-10-CM | POA: Diagnosis not present

## 2023-04-12 NOTE — Progress Notes (Signed)
 Psychotherapy Progress Note [facility] Marliss Czar, PhD LP  Patient ID: Stefanie Stewart)    MRN: [id number] Therapy format: Individual psychotherapy Date: 04/12/2023      Start: 9:30a     Stop: 10:10a     Time Spent: 40 min Location: Telehealth visit -- I connected with this patient by an approved telecommunication method (audio only), with her informed consent, and verifying identity and patient privacy.  I was located at my office and patient at her workplace.  As needed, we discussed the limitations, risks, and security and privacy concerns associated with telehealth service, including the availability and conditions which currently govern in-person appointments and the possibility that 3rd-party payment may not be fully guaranteed and she may be responsible for charges.  After she indicated understanding, we proceeded with the session.  Also discussed treatment planning, as needed, including ongoing verbal agreement with the plan, the opportunity to ask and answer all questions, her demonstrated understanding of instructions, and her readiness to call the office should symptoms worsen or she feels she is in a crisis state and needs more immediate and tangible assistance.   Session narrative (presenting needs, interim history, self-report of stressors and symptoms, applications of prior therapy, status changes, and interventions made in session) Reached Pt 9:10, left voicemail asking for clarification of her wishes.  Call back 9:30am, thought her appt 10am, went ahead and established abbreviated session.    Relationship working out very well with [name], very compatible, now exploring moving in together, with increased anxiety.  Acknowledged it's freaky, fundamentally, to have it going well when she's had to be so careful for so much so long before, but so much is still going right, very compatible, sometimes made nervous for him to be indicating how much he's into her.   Validated that she can have the same anxiety because her intuition is picking up a problem or her self-protection brain is picking up the inherent risk of commitment and how being more "in" is one of the things that went before the prolonged pain that was [place].  She'll just have to step through it and see, and just be willing to be vulnerable or step back depending on what she gets, with trust that in the worst case she can't be as badly surprised, shocked, or hurt as she was before, just disappointed.  C/o financial hardship right now, too, with shorter hours and pay recently.  Worried about whether [name] will still like her if he understands better what she has to work out.  Considering liquidating some 401(k) to cover her needs.  Advised to see about borrowing against it first, rather than incur the tax penalty.  Toward relationship, also advised it is OK, and probably helpful, to offer to let him know how her finances really are, since it demonstrates trust and clarifies his feelings.  Re weight concern, she has gained 30lbs now, while being off her [employer] plan and the gym.  Explained that her insulin resistance is probably still active, so she is going to have calorie cravings that get taken for carb cravings, but if she can reallocate calories, emphasize healthy fat and some protein in her energy mix, she could tame those cravings and her underlying insulin resistance.  Briefly discussed food choices that might help.  Therapeutic modalities: Cognitive Behavioral Therapy, Solution-Oriented/Positive Psychology, Ego-Supportive, and Psycho-education/Bibliotherapy  Mental Status/Observations:  Appearance:   Not assessed     Behavior:  Appropriate  Motor:  Not assessed  Speech/Language:   Clear and Coherent and mild pressure  Affect:  Not assessed  Mood:  anxious  Thought process:  normal  Thought content:    WNL and worry  Sensory/Perceptual disturbances:    WNL  Orientation:  Fully oriented   Attention:  Good    Concentration:  Fair  Memory:  WNL  Insight:    Variable  Judgment:   Good  Impulse Control:  Variable   Risk Assessment: Danger to Self: No Self-injurious Behavior: No Danger to Others: No Physical Aggression / Violence: No Duty to Warn: No Access to Firearms a concern: No  Assessment of progress:  progressing  Diagnosis:   ICD-10-CM   1. Generalized anxiety disorder  F41.1     2. 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 Financial problems  Z59.9     4. 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 Recent weight gain  R63.5      Plan:  Finances --  Budget effectively, investigate options for self-loan as needed Health -- Continue good diabetes care, reasonable diet & exercise, fade aggressive weight loss plan to more normal diet with control.  Recommend ongoing carb control for emotional equilibrium and menopause support, especially with suspected insulin resistance.  Employ keto and/or Mediterranean strategies.  Option OA if in search of support and help mastering compulsive eating. Occupational stress -- Continue integrating into the new work environment.  Try to worry less what people think of her and bring her own peace of mind to interactions.  Continue picking battles about features of the work environment or process to challenge.  Family dynamics -- Continue communication advice with intrusive or otherwise pressuring family/friends Self-soothing techniques -- Mindful minute, listen to ambient noise (clock, air vent), try listening to a metronome app and playing with the tempo Worry/catastrophizing -- Social: continue letting people draw their own conclusions, explain changes and attitudes at discretion.  Safety: for imagined horrors like predatory crime, try to see deterrents from the predator's perspective, and self-defense measures she could  take Dating/relationship -- Endorse the relationship, be ready to recognize anxiety about vulnerability and commitment as separate from actual reasons to doubt her partner.  Practice suspending snap judgments, willingness to check perceptions, assertively asking for things, and taking turns.  For any suspected hormonal issues affecting sex, see gynecologist or PC.   Other recommendations/advice -- As may be noted above.  Continue to utilize previously learned skills ad lib. Medication compliance -- Maintain medication as prescribed and work faithfully with relevant prescriber(s) if any changes are desired or seem indicated. Crisis service -- Aware of call list and work-in appts.  Call the clinic on-call service, 988/hotline, 911, or present to [facility] or ER if any life-threatening psychiatric crisis. Followup -- Return for time as already scheduled, will need to change time on 3/21.  Next scheduled visit with me 05/03/2023.  Next scheduled in this office 05/03/2023.  Robley Fries, PhD Marliss Czar, PhD LP Clinical Psychologist, [facility] [address] [place], [place] [address] [phone]

## 2023-04-23 ENCOUNTER — Other Ambulatory Visit: Payer: Self-pay | Admitting: Nurse Practitioner

## 2023-04-23 NOTE — Telephone Encounter (Signed)
Pt called and stated that she needs this medicine, she has been out several days. Please advise.

## 2023-05-03 ENCOUNTER — Ambulatory Visit: Payer: BC Managed Care – PPO | Admitting: Psychiatry

## 2023-05-03 DIAGNOSIS — Z609 Problem related to social environment, unspecified: Secondary | ICD-10-CM | POA: Diagnosis not present

## 2023-05-03 DIAGNOSIS — F325 Major depressive disorder, single episode, in full remission: Secondary | ICD-10-CM | POA: Diagnosis not present

## 2023-05-03 DIAGNOSIS — E1169 Type 2 diabetes mellitus with other specified complication: Secondary | ICD-10-CM | POA: Diagnosis not present

## 2023-05-03 DIAGNOSIS — F411 Generalized anxiety disorder: Secondary | ICD-10-CM | POA: Diagnosis not present

## 2023-05-03 NOTE — Progress Notes (Signed)
 Psychotherapy Progress Note [facility] Stefanie Kendall, PhD LP  Patient ID: Stefanie Stewart)    MRN: [id number] Therapy format: Individual psychotherapy Date: 05/03/2023      Start: 10:15a     Stop: 11:00a     Time Spent: 45 min Location: Telehealth visit -- I connected with this patient by an approved telecommunication method (audio only), with her informed consent, and verifying identity and patient privacy.  I was located at my office and patient at her workplace.  As needed, we discussed the limitations, risks, and security and privacy concerns associated with telehealth service, including the availability and conditions which currently govern in-person appointments and the possibility that 3rd-party payment may not be fully guaranteed and she may be responsible for charges.  After she indicated understanding, we proceeded with the session.  Also discussed treatment planning, as needed, including ongoing verbal agreement with the plan, the opportunity to ask and answer all questions, her demonstrated understanding of instructions, and her readiness to call the office should symptoms worsen or she feels she is in a crisis state and needs more immediate and tangible assistance.   Session narrative (presenting needs, interim history, self-report of stressors and symptoms, applications of prior therapy, status changes, and interventions made in session) Continues to work on [place] moving in.  He has land work [place].  Big talk coming this weekend to work out plans about property and where they live.  Already each feel like they have found their life partner now.  Current intention to live together at her place.  They have broached the subject of finances, breaking ground in trust.  Still not sure of the whole thing.  Advocated she continue to have revealing conversations, let him know things she is not sure how he'll take, and learn how safe he is from  how he responds.  Also practice perception checks rather than jump to conclusions when uneasy, e.g., I'm feeling uneasy -- is that because ... ?  Stefanie Stewart has been difficult lately.  Knows he's been having some harsh fights lately with her husband, whom she kicked out.  Taking things more personally, and making crabby comments.  Discussed coping thoughts and ways to constructively ask her to dial back.  Bodily, out of her exercise and training habit, says it's for costs' sake.  Could walk again, but figures she'd have to get up 5am to do it, and it's just too early and cold.  Feeling some alarm at gaining weight back, now est 30-40lbs.  Affirmed the value of burning calories any way she can, and just getting movement in any way she can, to stimulate the body to use the fuel it takes in, even if it's not a dedicated hour in a day at a place.  Acknowledges a sugar relapse in particular, and the need to do a carb detox again.  Affirmed and encouraged, reiterating that for her sugar is much more than ;likely a drug that needs to be managed.  Therapeutic modalities: Cognitive Behavioral Therapy and Solution-Oriented/Positive Psychology  Mental Status/Observations:  Appearance:   Not assessed     Behavior:  Appropriate  Motor:  Not assessed  Speech/Language:   Clear and Coherent and some pressure  Affect:  Not assessed  Mood:  anxious and somewhat less  Thought process:  normal  Thought content:    WNL  Sensory/Perceptual disturbances:    WNL  Orientation:  Fully oriented  Attention:  Good  Concentration:  Fair  Memory:  WNL  Insight:    Fair  Judgment:   Good  Impulse Control:  Fair   Risk Assessment: Danger to Self: No Self-injurious Behavior: No Danger to Others: No Physical Aggression / Violence: No Duty to Warn: No Access to Firearms a concern: No  Assessment of progress:  stabilized  Diagnosis:   ICD-10-CM   1. Generalized anxiety disorder  F41.1     2. Major depressive  disorder, single episode, in remission (HCC)  F32.5     3. 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 Social problem not due to mental disorder  Z60.9      Plan:  Finances --  Budget effectively, investigate options for self-loan as needed Health -- Continue good diabetes care, reasonable diet & exercise, fade aggressive weight loss plan to more normal diet with control.  Recommend ongoing carb control for emotional equilibrium and menopause support, especially with suspected insulin resistance.  Employ keto and/or Mediterranean strategies.  Option OA if in search of support and help mastering compulsive eating. Occupational stress -- Continue integrating into the new work environment.  Try to worry less what people think of her and bring her own peace of mind to interactions.  Continue picking battles about features of the work environment or process to challenge.  Family dynamics -- Continue communication advice with intrusive or otherwise pressuring family/friends Self-soothing techniques -- Mindful minute, listen to ambient noise (clock, air vent), try listening to a metronome app and playing with the tempo Worry/catastrophizing -- Social: continue letting people draw their own conclusions, explain changes and attitudes at discretion.  Safety: for imagined horrors like predatory crime, try to see deterrents from the predator's perspective, and self-defense measures she could take Dating/relationship -- Endorse the relationship, be ready to recognize anxiety about vulnerability and commitment as separate from actual reasons to doubt her partner.  Practice suspending snap judgments, willingness to check perceptions, assertively asking for things, and taking turns.  For any suspected hormonal issues affecting sex, see gynecologist or PC.   Other recommendations/advice -- As may be noted above.  Continue to utilize  previously learned skills ad lib. Medication compliance -- Maintain medication as prescribed and work faithfully with relevant prescriber(s) if any changes are desired or seem indicated. Crisis service -- Aware of call list and work-in appts.  Call the clinic on-call service, 988/hotline, 911, or present to [facility] or ER if any life-threatening psychiatric crisis. Followup -- Return for time as already scheduled.  Next scheduled visit with me 06/14/2023.  Next scheduled in this office 06/14/2023.  Lamar Kendall, PhD Stefanie Kendall, PhD LP Clinical Psychologist, [facility] [address] [place], [place] [address] [phone]

## 2023-05-06 ENCOUNTER — Ambulatory Visit: Payer: Self-pay

## 2023-05-06 ENCOUNTER — Other Ambulatory Visit: Payer: Self-pay | Admitting: Nurse Practitioner

## 2023-05-06 MED ORDER — SERTRALINE HCL 100 MG PO TABS: 100.0000 mg | ORAL_TABLET | Freq: Every day | ORAL | 4 refills | Status: AC

## 2023-05-06 NOTE — Telephone Encounter (Signed)
 Called pharmacy and confirmed that medication is ready to be picked up for a 90 day supply.

## 2023-05-06 NOTE — Addendum Note (Signed)
 Addended by: Corderius Saraceni T on: 05/06/2023 01:40 PM   Modules accepted: Orders

## 2023-05-06 NOTE — Telephone Encounter (Signed)
 Routing to provider to advise. Can the patient be prescribed a 100 mg tablet to take once daily instead of the 2 50 mg tablets?

## 2023-05-06 NOTE — Telephone Encounter (Signed)
 Called and notified patient that medication has been sent in for her. Will call [facility] and confirm that medication can and will be filled for the patient.

## 2023-05-06 NOTE — Telephone Encounter (Signed)
 Medication Refill -  Most Recent Primary Care Visit:  Provider: Hadassah Letters  Department: [facility]  Visit Type: MYCHART VIDEO VISIT  Date: 03/15/2023  Medication: sertraline  (ZOLOFT ) 50 MG tablet   Has the patient contacted their pharmacy? Yes Patient states that pharmacy will not fill Rx. Chart shows patient should have refills.Patient states that she has been without medication for 2 months.  Is this the correct pharmacy for this prescription? Yes If no, delete pharmacy and type the correct one.  This is the patient's preferred pharmacy:   [facility] [address], [place] - [address] [place] [address] Phone: [phone] Fax: [phone]   Has the prescription been filled recently? Yes  Is the patient out of the medication? Yes  Has the patient been seen for an appointment in the last year OR does the patient have an upcoming appointment? Yes  Can we respond through MyChart? Yes  Agent: Please be advised that Rx refills may take up to 3 business days. We ask that you follow-up with your pharmacy.

## 2023-05-06 NOTE — Telephone Encounter (Addendum)
 [facility] pharmacy and spoke with Imani.  Imani states that pt's insurance will not pay for 2 tablets daily. They will only pay for 1.5 tablets daily. Provider may request a prior authorization.  Please advise. Pt is out of medication.   Chief Complaint: Anxiety depression Symptoms: above Frequency: ongoing  - much worse since running out of medication Pertinent Negatives: Patient denies  Disposition: [] ED /[] Urgent Care (no appt availability in office) / [] Appointment(In office/virtual)/ []  [facility]/ [] Home Care/ [] Refused Recommended Disposition /[] [place] [facility]/ [x]  Follow-up with PCP Additional Notes: Pt called as she has been out of medication Sertraline  50mg , for 2 months. Pt states that she has requested a re-fill and it has been denied for requesting refill "too soon". Last rx was 07/2022 for #180 with 4 rf. Confirmed by pharmacy. Called pharmacy - they are not open until 9am. Pt would like medication refilled, but if not she will just, "move on and forget about it".  Pharmacy needs to be called after opening.    Reason for Disposition  Patient sounds very upset or troubled to the triager  Answer Assessment - Initial Assessment Questions 1. CONCERN: "Did anything happen that prompted you to call today?"      Out of medication for 2 months - wants refill feeling anxious 2. ANXIETY SYMPTOMS: "Can you describe how you (your loved one; patient) have been feeling?" (e.g., tense, restless, panicky, anxious, keyed up, overwhelmed, sense of impending doom).      Anxious - depression 3. ONSET: "How long have you been feeling this way?" (e.g., hours, days, weeks)     Since running out of medication 4. SEVERITY: "How would you rate the level of anxiety?" (e.g., 0 - 10; or mild, moderate, severe).     moderate 6. HISTORY: "Have you felt this way before?" "Have you ever been diagnosed with an anxiety problem in the past?" (e.g., generalized anxiety disorder, panic  attacks, PTSD). If Yes, ask: "How was this problem treated?" (e.g., medicines, counseling, etc.)     Yes - medication  Protocols used: Anxiety and Panic Attack-A-AH

## 2023-05-07 NOTE — Telephone Encounter (Signed)
Refill refused - dose change. New dosage refilled yesterday.

## 2023-05-07 NOTE — Telephone Encounter (Signed)
Requested Prescriptions  Refused Prescriptions Disp Refills   sertraline (ZOLOFT) 50 MG tablet 180 tablet 4    Sig: Take 2 tablets (100 mg total) by mouth daily.     There is no refill protocol information for this order

## 2023-05-17 ENCOUNTER — Ambulatory Visit: Payer: Self-pay | Admitting: Nurse Practitioner

## 2023-05-21 DIAGNOSIS — N898 Other specified noninflammatory disorders of vagina: Secondary | ICD-10-CM | POA: Diagnosis not present

## 2023-05-21 DIAGNOSIS — R102 Pelvic and perineal pain: Secondary | ICD-10-CM | POA: Diagnosis not present

## 2023-05-21 DIAGNOSIS — R300 Dysuria: Secondary | ICD-10-CM | POA: Diagnosis not present

## 2023-05-23 ENCOUNTER — Telehealth: Payer: Self-pay

## 2023-05-23 NOTE — Telephone Encounter (Signed)
 Reviewed patient's chart. Labs are pended and patient should fast if possible. Called and LVM notifying patient of this.   Copied from CRM [id number]. Topic: Clinical - Medical Advice >> May 23, 2023  9:57 AM Elle L wrote: Reason for CRM: The patient is requesting to see if she needs to fast for her office visit tomorrow. Her call back number is [phone].

## 2023-05-24 ENCOUNTER — Ambulatory Visit: Payer: BC Managed Care – PPO | Admitting: Nurse Practitioner

## 2023-05-24 DIAGNOSIS — R7401 Elevation of levels of liver transaminase levels: Secondary | ICD-10-CM

## 2023-05-24 DIAGNOSIS — E1129 Type 2 diabetes mellitus with other diabetic kidney complication: Secondary | ICD-10-CM

## 2023-05-24 DIAGNOSIS — I152 Hypertension secondary to endocrine disorders: Secondary | ICD-10-CM

## 2023-05-24 DIAGNOSIS — T466X5A Adverse effect of antihyperlipidemic and antiarteriosclerotic drugs, initial encounter: Secondary | ICD-10-CM

## 2023-05-24 DIAGNOSIS — E1169 Type 2 diabetes mellitus with other specified complication: Secondary | ICD-10-CM

## 2023-05-24 DIAGNOSIS — N951 Menopausal and female climacteric states: Secondary | ICD-10-CM

## 2023-05-24 DIAGNOSIS — F320 Major depressive disorder, single episode, mild: Secondary | ICD-10-CM

## 2023-05-29 ENCOUNTER — Other Ambulatory Visit: Payer: Self-pay | Admitting: Nurse Practitioner

## 2023-05-30 NOTE — Telephone Encounter (Signed)
 Requested medication (s) are due for refill today- yes  Requested medication (s) are on the active medication list -yes  Future visit scheduled -yes  Last refill: 04/10/23   Notes to clinic: listed as historical medication- sent for review   Requested Prescriptions  Pending Prescriptions Disp Refills   norethindrone (AYGESTIN) 5 MG tablet [Pharmacy Med Name: Norethindrone Acetate 5 MG Oral Tablet] 30 tablet 0    Sig: Take 1 tablet by mouth once daily     OB/GYN: Contraceptives - Progestins Failed - 05/30/2023  1:07 PM      Failed - Last BP in normal range    BP Readings from Last 1 Encounters:  02/08/23 (!) 146/86         Passed - Valid encounter within last 12 months    Recent Outpatient Visits           2 months ago Upper respiratory tract infection, unspecified type   [facility] Jackolyn Confer, MD   3 months ago Hypersensitivity, initial encounter   [place] [facility] Meckling, Sherran Needs, NP   6 months ago Type 2 diabetes mellitus with proteinuria (HCC)   [place] [facility] [place], [place] T, NP   10 months ago Type 2 diabetes mellitus with proteinuria (HCC)   [place] [facility] [place], Corrie Dandy T, NP   1 year ago Type 2 diabetes mellitus with proteinuria (HCC)   [place] [facility] [place], Dorie Rank, NP       Future Appointments             In 2 weeks Marjie Skiff, NP [place] [facility]            Passed - Patient is not a smoker         Requested Prescriptions  Pending Prescriptions Disp Refills   norethindrone (AYGESTIN) 5 MG tablet [Pharmacy Med Name: Norethindrone Acetate 5 MG Oral Tablet] 30 tablet 0    Sig: Take 1 tablet by mouth once daily     OB/GYN: Contraceptives - Progestins Failed - 05/30/2023  1:07 PM      Failed - Last BP in normal range    BP Readings from Last 1 Encounters:  02/08/23 (!) 146/86         Passed  - Valid encounter within last 12 months    Recent Outpatient Visits           2 months ago Upper respiratory tract infection, unspecified type   [place] [facility] Jackolyn Confer, MD   3 months ago Hypersensitivity, initial encounter   [facility] [place], Sherran Needs, NP   6 months ago Type 2 diabetes mellitus with proteinuria (HCC)   [place] [facility] [place], [place] T, NP   10 months ago Type 2 diabetes mellitus with proteinuria (HCC)   [place] [facility] [place], Corrie Dandy T, NP   1 year ago Type 2 diabetes mellitus with proteinuria (HCC)   [place] [facility] [place], Dorie Rank, NP       Future Appointments             In 2 weeks [name], Dorie Rank, NP  [facility]            Passed - Patient is not a smoker

## 2023-06-09 NOTE — Patient Instructions (Signed)

## 2023-06-14 ENCOUNTER — Encounter: Payer: Self-pay | Admitting: Nurse Practitioner

## 2023-06-14 ENCOUNTER — Ambulatory Visit: Payer: BC Managed Care – PPO | Admitting: Psychiatry

## 2023-06-14 ENCOUNTER — Ambulatory Visit: Payer: BC Managed Care – PPO | Admitting: Nurse Practitioner

## 2023-06-14 VITALS — BP 134/79 | HR 87 | Temp 98.1°F | Ht 67.5 in | Wt 184.2 lb

## 2023-06-14 DIAGNOSIS — Z609 Problem related to social environment, unspecified: Secondary | ICD-10-CM | POA: Diagnosis not present

## 2023-06-14 DIAGNOSIS — I152 Hypertension secondary to endocrine disorders: Secondary | ICD-10-CM | POA: Diagnosis not present

## 2023-06-14 DIAGNOSIS — R809 Proteinuria, unspecified: Secondary | ICD-10-CM | POA: Diagnosis not present

## 2023-06-14 DIAGNOSIS — F320 Major depressive disorder, single episode, mild: Secondary | ICD-10-CM

## 2023-06-14 DIAGNOSIS — F325 Major depressive disorder, single episode, in full remission: Secondary | ICD-10-CM | POA: Diagnosis not present

## 2023-06-14 DIAGNOSIS — E1159 Type 2 diabetes mellitus with other circulatory complications: Secondary | ICD-10-CM | POA: Diagnosis not present

## 2023-06-14 DIAGNOSIS — R7401 Elevation of levels of liver transaminase levels: Secondary | ICD-10-CM | POA: Diagnosis not present

## 2023-06-14 DIAGNOSIS — F411 Generalized anxiety disorder: Secondary | ICD-10-CM

## 2023-06-14 DIAGNOSIS — Z23 Encounter for immunization: Secondary | ICD-10-CM

## 2023-06-14 DIAGNOSIS — E1169 Type 2 diabetes mellitus with other specified complication: Secondary | ICD-10-CM

## 2023-06-14 DIAGNOSIS — M791 Myalgia, unspecified site: Secondary | ICD-10-CM

## 2023-06-14 DIAGNOSIS — Z124 Encounter for screening for malignant neoplasm of cervix: Secondary | ICD-10-CM

## 2023-06-14 DIAGNOSIS — E785 Hyperlipidemia, unspecified: Secondary | ICD-10-CM | POA: Diagnosis not present

## 2023-06-14 DIAGNOSIS — Z720 Tobacco use: Secondary | ICD-10-CM

## 2023-06-14 DIAGNOSIS — Z7985 Long-term (current) use of injectable non-insulin antidiabetic drugs: Secondary | ICD-10-CM

## 2023-06-14 DIAGNOSIS — E1129 Type 2 diabetes mellitus with other diabetic kidney complication: Secondary | ICD-10-CM

## 2023-06-14 DIAGNOSIS — T466X5A Adverse effect of antihyperlipidemic and antiarteriosclerotic drugs, initial encounter: Secondary | ICD-10-CM

## 2023-06-14 LAB — MICROALBUMIN, URINE WAIVED
Creatinine, Urine Waived: 300 mg/dL (ref 10–300)
Microalb, Ur Waived: 80 mg/L — ABNORMAL HIGH (ref 0–19)
Microalb/Creat Ratio: <30 mg/g (ref <30)

## 2023-06-14 LAB — BAYER DCA HB A1C WAIVED: HB A1C (BAYER DCA - WAIVED): 5.3 % (ref 4.8–5.6)

## 2023-06-14 MED ORDER — OZEMPIC (0.25 OR 0.5 MG/DOSE) 2 MG/3ML ~~LOC~~ SOPN: PEN_INJECTOR | SUBCUTANEOUS | 4 refills | Status: DC

## 2023-06-14 NOTE — Assessment & Plan Note (Signed)
 Chronic, stable .  BP well below goal today. Maintain off Losartan.  Discussed benefit of ARB for kidney protection with proteinuria, however at this time will hold off on taking.  Recommend she monitor BP at least a few mornings a week at home and document.  DASH diet at home.  Labs: CMP.

## 2023-06-14 NOTE — Assessment & Plan Note (Signed)
 Chronic, ongoing.  Continue Wellbutrin, but slowly reduce Sertraline due to issues having orgasm.  Instructed her on how to perform a slow reduction to discontinuation of this.  Denies SI/HI.

## 2023-06-14 NOTE — Assessment & Plan Note (Signed)
 Chronic, ongoing.  History of poor tolerance statin. Continue Repatha, as levels without it on board elevate significantly.  Lipid panel today.

## 2023-06-14 NOTE — Progress Notes (Signed)
 Psychotherapy Progress Note [facility] Marliss Czar, PhD LP  Patient ID: Stefanie Stewart)    MRN: [id number] Therapy format: Individual psychotherapy Date: 06/14/2023      Start: 1:10p     Stop: 1:42p     Time Spent: 32 min Location: Telehealth visit -- I connected with this patient by an approved telecommunication method (audio only), with her informed consent, and verifying identity and patient privacy.  I was located at my office and patient at her home.  As needed, we discussed the limitations, risks, and security and privacy concerns associated with telehealth service, including the availability and conditions which currently govern in-person appointments and the possibility that 3rd-party payment may not be fully guaranteed and she may be responsible for charges.  After she indicated understanding, we proceeded with the session.  Also discussed treatment planning, as needed, including ongoing verbal agreement with the plan, the opportunity to ask and answer all questions, her demonstrated understanding of instructions, and her readiness to call the office should symptoms worsen or she feels she is in a crisis state and needs more immediate and tangible assistance.   Session narrative (presenting needs, interim history, self-report of stressors and symptoms, applications of prior therapy, status changes, and interventions made in session) Reached while out shopping, at times difficult to secure undivided attention.  Has gotten engaged now to [employer], is at peace with the decision and friends are almost universally happy for her.  Preoccupied with old friend Carollee Herter of 40 yrs, who is having a uniquely distressed, negative reaction to it, initially casting doubt on it and seemingly all kinds of negative about it.  Relates an attempt to hear her out, mandated to be in a restaurant for some reason, that ended in [place] telling her she won't let her make her point, breaking into  tears, and leaving.  Reviewing how it all went down, it sounds simultaneously like Carollee Herter has been both very distressed and very impressionistic in what she has to say, and Daiana has made the mistakes of trying to have emotionally wrought conversations during work (people-pleasing relapse) and respond too abruptly defending her own choice (actually not listening, as accused).  Supportively challenged [name] whether she wants to hear Carollee Herter out or ditch her for now, and whether she has the bandwidth to just hear her out, not have to defend anything, and at worst accept aloud that they can disagree about it.  Forecast that, most likely, if Naomie can make it honestly undivided attention, go into it with nothing to have to defend, and approach it only as "customer service" for a friend, it's very likely Carollee Herter will calm down and no more scene over it.  Most likely what it all boils down to is Carollee Herter feeling at risk of being left behind, a la high school, but possible she's been sitting on something much more intense about her own relationship (known to be troubled) or her own health.  Either way, making room to listen without having to do anything else, and OK to disagree, should help get a long friendships out from under whatever feelings and tangled communication they've been through so far.  Validated how badly Emmajane wants everyone to be uniformly happy for her, and acknowledged she can't control what state her friends will be in when it's time to be celebrated.  Agreed, helpful.  No opportunity to check in on eating and weight concerns, or process how she worked through issues with [name] about location  and financial transparency, but trusting at this point that the decision to marry must rest on a good track record lately with those things.  Broke early due to Pt's need to urinate, agreed to come back as scheduled, call for earlier time if desired.  Therapeutic modalities: Cognitive Behavioral Therapy,  Solution-Oriented/Positive Psychology, Ego-Supportive, and Assertiveness/Communication  Mental Status/Observations:  Appearance:   Not assessed     Behavior:  Restless -- limited privacy, distressing subject   Motor:  Not assessed  Speech/Language:   Clear and Coherent and mild pressure  Affect:  Not assessed  Mood:  Mixed, appropriate to subject  Thought process:  normal and intrusive worries , some conclusion-jumping  Thought content:    Obsessions  Sensory/Perceptual disturbances:    WNL  Orientation:  Fully oriented  Attention:  Fair    Concentration:  Good  Memory:  WNL  Insight:    Variable  Judgment:   Good  Impulse Control:  Good   Risk Assessment: Danger to Self: No Self-injurious Behavior: No Danger to Others: No Physical Aggression / Violence: No Duty to Warn: No Access to Firearms a concern: No  Assessment of progress:  stabilized  Diagnosis:   ICD-10-CM   1. Generalized anxiety disorder  F41.1     2. Social problem not due to mental disorder  Z60.9     3. 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  Recommend ongoing carb control for emotional equilibrium and menopause support, especially with suspected insulin resistance.  Employ keto and/or Mediterranean strategies.  Option OA if in search of support and help mastering compulsive eating. Occupational stress -- Continue integrating into the new work environment.  Try to worry less what people think of her and bring her own peace of mind to interactions.  Continue picking battles about features of the work environment or process to challenge.   Family/friend dynamics -- Continue communication advice with intrusive or otherwise pressuring family/friends.  Watch people-pleasing tendencies but seek to correct them by giving herself room to decided whether to do something, not indulge a need to push back, i.e., get comfortable enough with yes, no, or not right now to speak them without having to defend.  With [place] in particular, try to offer a time of nonjudgmental, nondefensive listening to draw out what's wrong, hopefully clarify her negative reaction to engagement, and both ask and portray how it is OK to disagree, let's just try to understand. Self-soothing techniques -- Mindful minute, listen to ambient noise (clock, air vent), try listening to a metronome app and playing with the tempo Worry/catastrophizing -- Social: continue letting people draw their own conclusions, explain changes and attitudes at discretion.  Safety: for imagined horrors like predatory crime, try to see deterrents from the predator's perspective, and self-defense measures she could take Dating/relationship -- Endorse the relationship and engagement.  In practice, emphasize being ready to recognize anxiety about vulnerability and commitment as separate from actual reasons to doubt her partner.  Practice suspending snap judgments, willingness to check perceptions, assertively asking for things, and taking turns.  For any suspected hormonal issues affecting sex, see gynecologist or  PC.   Other recommendations/advice -- As may be noted above.  Continue to utilize previously learned skills ad lib. Medication compliance -- Maintain medication as prescribed and work faithfully with relevant prescriber(s) if any changes are desired or seem indicated. Crisis service -- Aware of call list and work-in appts.  Call the clinic on-call service, 988/hotline, 911, or present to [facility] or ER if any life-threatening psychiatric crisis. Followup -- No follow-ups on file.  Next scheduled  visit with me 07/19/2023.  Next scheduled in this office 07/19/2023.  Robley Fries, PhD Marliss Czar, PhD LP Clinical Psychologist, [facility] [address], [address] [place], [place] [address] [phone]

## 2023-06-14 NOTE — Assessment & Plan Note (Signed)
Recheck CMP today.  Consider imaging if elevations continue. 

## 2023-06-14 NOTE — Assessment & Plan Note (Signed)
History of myalgia, even with 3 day a week Crestor.  Continue Repatha and recheck levels today.

## 2023-06-14 NOTE — Assessment & Plan Note (Signed)
I have recommended complete cessation of tobacco use. I have discussed various options available for assistance with tobacco cessation including over the counter methods (Nicotine gum, patch and lozenges). We also discussed prescription options (Chantix, Nicotine Inhaler / Nasal Spray). The patient is not interested in pursuing any prescription tobacco cessation options at this time.  Determine whether to start lung CT screening annually at age 48.  

## 2023-06-14 NOTE — Assessment & Plan Note (Addendum)
 Diagnosed December 2021, last  A1c 5.3% today, remains well controlled.  She has gained weight and is concerned about sugars, will trial Ozempic 0.25 MG weekly for 4 weeks and then 0.5 MG weekly on week 5. No family history of thyroid cancer (MTC, MEN 2, thyroid cell tumors) or pancreatitis.  Urine ALB 80 March 2025.   - Continue diet focus and exercise. - Stopped Losartan past visit due to orthostatic hypotension episodes with her weight loss. - Educated on blood sugar goals, less then 130 fasting in morning and <180 two hours after a meal.   - Eye and foot exam up to date. - Repatha on board - She prefers to return in 6 months, but will alert PCP via MyChart if any issues with medication.

## 2023-06-14 NOTE — Progress Notes (Signed)
 BP 134/79   Pulse 87   Temp 98.1 F (36.7 C) (Oral)   Ht 5' 7.5" (1.715 m)   Wt 184 lb 3.2 oz (83.6 kg)   SpO2 98%   BMI 28.42 kg/m    Subjective:    Patient ID: Stefanie Stewart, female    DOB: [birth year], 48 y.o.   MRN: [id number]  HPI: Stefanie Stewart is a 48 y.o. female  Chief Complaint  Patient presents with   Diabetes   Hypertension   Hyperlipidemia   Depression   DIABETES Diagnosed December 2021 when A1c was 11.3%. Started on Metformin 1000 MG BID at the time. Currently is diet controlled.  Continues to work-out and working on diet, although has gained weight due to a friend bringing a cheesecake, got reintroduced to sugar.  A1c in August was 5.1%. Hypoglycemic episodes:no Polydipsia/polyuria: no Visual disturbance: no Chest pain: no Paresthesias: no Glucose Monitoring: yes  Accucheck frequency: Daily  Fasting glucose:  average around 80 at home  Post prandial:  Evening:  Before meals: Taking Insulin?: no  Long acting insulin:  Short acting insulin: Blood Pressure Monitoring: not checking Retinal Examination: Up to Date Foot Exam: Up to Date  Pneumovax: Up To Date Influenza: Up To Date Aspirin: no   HYPERTENSION / HYPERLIPIDEMIA Continues Repatha for HLD.  Does not tolerate statins or Zetia.  Continues to vape, varies day to day how often she uses.  On Losartan in past, but this was stopped with her weight loss. Satisfied with current treatment? yes Duration of hypertension: chronic BP monitoring frequency: not checking BP range:  BP medication side effects: no Duration of hyperlipidemia: chronic Cholesterol medication side effects: no Cholesterol supplements: none Medication compliance: good compliance Aspirin: no Recent stressors: no Recurrent headaches: no Visual changes: no Palpitations: no Dyspnea: a little bit this week Chest pain: no Lower extremity edema: no Dizzy/lightheaded: no  The 10-year ASCVD risk score (Arnett DK, et al., 2019)  is: 1.8%   Values used to calculate the score:     Age: 87 years     Sex: Female     Is Non-Hispanic African American: No     Diabetic: Yes     Tobacco smoker: No     Systolic Blood Pressure: 134 mmHg     Is BP treated: No     HDL Cholesterol: 49 mg/dL     Total Cholesterol: 145 mg/dL  DEPRESSION Taking Wellbutrin and Zoloft with benefit. She is engaged, has met love of her life.  Met him online.  Zoloft is effecting her ability to have orgasm. Getting married in May. Mood status: stable Satisfied with current treatment?: yes Symptom severity: mild  Duration of current treatment : chronic Side effects: no Medication compliance: good compliance Psychotherapy/counseling: yes current Depressed mood: no Anxious mood: no Anhedonia: no Significant weight loss or gain: no Insomnia: no Fatigue: no Feelings of worthlessness or guilt: no Impaired concentration/indecisiveness: no Suicidal ideations: no Hopelessness: no Crying spells: no    06/14/2023    8:29 AM 02/08/2023    9:05 AM 11/16/2022    8:47 AM 07/27/2022    8:19 AM 03/30/2022    9:02 AM  Depression screen PHQ 2/9  Decreased Interest 0 0 0 1 0  Down, Depressed, Hopeless 0 0 0 0 0  PHQ - 2 Score 0 0 0 1 0  Altered sleeping 1 3 3 3 3   Tired, decreased energy 0 0 0 1 0  Change in appetite 3 2  0 0 1  Feeling bad or failure about yourself  0 0 0 0 0  Trouble concentrating 3 2 0 0 0  Moving slowly or fidgety/restless 0 0 0 0 0  Suicidal thoughts 0 0 0 0 0  PHQ-9 Score 7 7 3 5 4   Difficult doing work/chores Somewhat difficult Not difficult at all Not difficult at all Somewhat difficult        06/14/2023    8:29 AM 02/08/2023    9:06 AM 11/16/2022    8:47 AM 07/27/2022    8:20 AM  GAD 7 : Generalized Anxiety Score  Nervous, Anxious, on Edge 1 1 2 3   Control/stop worrying 0 2 2 3   Worry too much - different things 3 2 2 3   Trouble relaxing 2 3 2 3   Restless 1 1 2  0  Easily annoyed or irritable 2 2 2 2   Afraid - awful  might happen 0 0 0 2  Total GAD 7 Score 9 11 12 16   Anxiety Difficulty Somewhat difficult Somewhat difficult Somewhat difficult Somewhat difficult   Relevant past medical, surgical, family and social history reviewed and updated as indicated. Interim medical history since our last visit reviewed. Allergies and medications reviewed and updated.  Review of Systems  Constitutional:  Negative for activity change, appetite change, diaphoresis, fatigue and fever.  Respiratory:  Negative for cough, chest tightness and shortness of breath.   Cardiovascular:  Negative for chest pain, palpitations and leg swelling.  Gastrointestinal: Negative.   Endocrine: Negative for polydipsia, polyphagia and polyuria.  Neurological: Negative.   Psychiatric/Behavioral: Negative.     Per HPI unless specifically indicated above     Objective:    BP 134/79   Pulse 87   Temp 98.1 F (36.7 C) (Oral)   Ht 5' 7.5" (1.715 m)   Wt 184 lb 3.2 oz (83.6 kg)   SpO2 98%   BMI 28.42 kg/m   Wt Readings from Last 3 Encounters:  06/14/23 184 lb 3.2 oz (83.6 kg)  02/08/23 167 lb (75.8 kg)  11/16/22 151 lb 9.6 oz (68.8 kg)    Physical Exam Vitals and nursing note reviewed.  Constitutional:      General: She is awake. She is not in acute distress.    Appearance: She is well-developed and well-groomed. She is not ill-appearing or toxic-appearing.  HENT:     Head: Normocephalic.     Right Ear: Hearing and external ear normal.     Left Ear: Hearing and external ear normal.  Eyes:     General: Lids are normal.        Right eye: No discharge.        Left eye: No discharge.     Conjunctiva/sclera: Conjunctivae normal.     Pupils: Pupils are equal, round, and reactive to light.  Neck:     Thyroid: No thyromegaly.     Vascular: No carotid bruit.  Cardiovascular:     Rate and Rhythm: Normal rate and regular rhythm.     Heart sounds: Normal heart sounds. No murmur heard.    No gallop.  Pulmonary:     Effort:  Pulmonary effort is normal. No accessory muscle usage or respiratory distress.     Breath sounds: Normal breath sounds.  Abdominal:     General: Bowel sounds are normal. There is no distension.     Palpations: Abdomen is soft.     Tenderness: There is no abdominal tenderness.  Musculoskeletal:     Cervical  back: Normal range of motion and neck supple.     Right lower leg: No edema.     Left lower leg: No edema.  Lymphadenopathy:     Cervical: No cervical adenopathy.  Skin:    General: Skin is warm and dry.  Neurological:     Mental Status: She is alert and oriented to person, place, and time.     Deep Tendon Reflexes: Reflexes are normal and symmetric.     Reflex Scores:      Brachioradialis reflexes are 2+ on the right side and 2+ on the left side.      Patellar reflexes are 2+ on the right side and 2+ on the left side. Psychiatric:        Attention and Perception: Attention normal.        Mood and Affect: Mood normal.        Speech: Speech normal.        Behavior: Behavior normal. Behavior is cooperative.        Thought Content: Thought content normal.    Results for orders placed or performed in visit on 11/16/22  Bayer DCA Hb A1c Waived   Collection Time: 11/16/22  8:55 AM  Result Value Ref Range   HB A1C (BAYER DCA - WAIVED) 5.1 4.8 - 5.6 %  Basic metabolic panel   Collection Time: 11/16/22  8:56 AM  Result Value Ref Range   Glucose 89 70 - 99 mg/dL   BUN 21 6 - 24 mg/dL   Creatinine, Ser 1.61 0.57 - 1.00 mg/dL   eGFR 86 >09 UE/AVW/0.98   BUN/Creatinine Ratio 25 (H) 9 - 23   Sodium 141 134 - 144 mmol/L   Potassium 4.4 3.5 - 5.2 mmol/L   Chloride 104 96 - 106 mmol/L   CO2 26 20 - 29 mmol/L   Calcium 9.4 8.7 - 10.2 mg/dL  Lipid Panel w/o Chol/HDL Ratio   Collection Time: 11/16/22  8:56 AM  Result Value Ref Range   Cholesterol, Total 145 100 - 199 mg/dL   Triglycerides 82 0 - 149 mg/dL   HDL 49 >11 mg/dL   VLDL Cholesterol Cal 16 5 - 40 mg/dL   LDL Chol Calc  (NIH) 80 0 - 99 mg/dL      Assessment & Plan:   Problem List Items Addressed This Visit       Cardiovascular and Mediastinum   Hypertension associated with diabetes (HCC) (Chronic)   Chronic, stable .  BP well below goal today. Maintain off Losartan.  Discussed benefit of ARB for kidney protection with proteinuria, however at this time will hold off on taking.  Recommend she monitor BP at least a few mornings a week at home and document.  DASH diet at home.  Labs: CMP.       Relevant Medications   Semaglutide,0.25 or 0.5MG /DOS, (OZEMPIC, 0.25 OR 0.5 MG/DOSE,) 2 MG/3ML SOPN   Other Relevant Orders   Bayer DCA Hb A1c Waived   Microalbumin, Urine Waived     Endocrine   Hyperlipidemia associated with type 2 diabetes mellitus (HCC) (Chronic)   Chronic, ongoing.  History of poor tolerance statin. Continue Repatha, as levels without it on board elevate significantly.  Lipid panel today.        Relevant Medications   Semaglutide,0.25 or 0.5MG /DOS, (OZEMPIC, 0.25 OR 0.5 MG/DOSE,) 2 MG/3ML SOPN   Other Relevant Orders   Bayer DCA Hb A1c Waived   Comprehensive metabolic panel   Lipid Panel w/o Chol/HDL  Ratio   Type 2 diabetes mellitus with proteinuria (HCC) - Primary (Chronic)   Diagnosed December 2021, last  A1c 5.3% today, remains well controlled.  She has gained weight and is concerned about sugars, will trial Ozempic 0.25 MG weekly for 4 weeks and then 0.5 MG weekly on week 5. No family history of thyroid cancer (MTC, MEN 2, thyroid cell tumors) or pancreatitis.  Urine ALB 80 March 2025.   - Continue diet focus and exercise. - Stopped Losartan past visit due to orthostatic hypotension episodes with her weight loss. - Educated on blood sugar goals, less then 130 fasting in morning and <180 two hours after a meal.   - Eye and foot exam up to date. - Repatha on board - She prefers to return in 6 months, but will alert PCP via MyChart if any issues with medication.      Relevant  Medications   Semaglutide,0.25 or 0.5MG /DOS, (OZEMPIC, 0.25 OR 0.5 MG/DOSE,) 2 MG/3ML SOPN   Other Relevant Orders   Bayer DCA Hb A1c Waived   Microalbumin, Urine Waived     Other   Depression (Chronic)   Chronic, ongoing.  Continue Wellbutrin, but slowly reduce Sertraline due to issues having orgasm.  Instructed her on how to perform a slow reduction to discontinuation of this.  Denies SI/HI.      Elevated ALT measurement   Recheck CMP today.  Consider imaging if elevations continue.      Relevant Orders   Comprehensive metabolic panel   Myalgia due to statin   History of myalgia, even with 3 day a week Crestor.  Continue Repatha and recheck levels today.      Vapes nicotine containing substance   I have recommended complete cessation of tobacco use. I have discussed various options available for assistance with tobacco cessation including over the counter methods (Nicotine gum, patch and lozenges). We also discussed prescription options (Chantix, Nicotine Inhaler / Nasal Spray). The patient is not interested in pursuing any prescription tobacco cessation options at this time.  Determine whether to start lung CT screening annually at age 35.         Follow up plan: Return in about 6 months (around 12/15/2023) for T2DM, HTN/HLD.

## 2023-06-15 ENCOUNTER — Encounter: Payer: Self-pay | Admitting: Nurse Practitioner

## 2023-06-15 LAB — COMPREHENSIVE METABOLIC PANEL
ALT: 29 IU/L (ref 0–32)
AST: 17 IU/L (ref 0–40)
Albumin: 4.5 g/dL (ref 3.9–4.9)
Alkaline Phosphatase: 44 IU/L (ref 44–121)
BUN/Creatinine Ratio: 17 (ref 9–23)
BUN: 15 mg/dL (ref 6–24)
Bilirubin Total: 0.3 mg/dL (ref 0.0–1.2)
CO2: 22 mmol/L (ref 20–29)
Calcium: 9.2 mg/dL (ref 8.7–10.2)
Chloride: 104 mmol/L (ref 96–106)
Creatinine, Ser: 0.86 mg/dL (ref 0.57–1.00)
Globulin, Total: 2.7 g/dL (ref 1.5–4.5)
Glucose: 105 mg/dL — ABNORMAL HIGH (ref 70–99)
Potassium: 4.9 mmol/L (ref 3.5–5.2)
Sodium: 140 mmol/L (ref 134–144)
Total Protein: 7.2 g/dL (ref 6.0–8.5)
eGFR: 83 mL/min/{1.73_m2} (ref >59)

## 2023-06-15 LAB — LIPID PANEL W/O CHOL/HDL RATIO
Cholesterol, Total: 176 mg/dL (ref 100–199)
HDL: 43 mg/dL (ref >39)
LDL Chol Calc (NIH): 120 mg/dL — ABNORMAL HIGH (ref 0–99)
Triglycerides: 69 mg/dL (ref 0–149)
VLDL Cholesterol Cal: 13 mg/dL (ref 5–40)

## 2023-06-15 NOTE — Progress Notes (Signed)
 Contacted via MyChart   Good evening Stefanie Stewart, your labs have returned: - Kidney function, creatinine and eGFR, remains normal, as is liver function, AST and ALT.  - Lipid panel shows trend up in LDL, are you taking your Repatha as ordered?  Let me know.  Any questions? Keep being amazing!!  Thank you for allowing me to participate in your care.  I appreciate you. Kindest regards, Zaryia Markel

## 2023-06-17 ENCOUNTER — Telehealth: Payer: Self-pay

## 2023-06-17 NOTE — Telephone Encounter (Signed)
 PA for Ozempic initiated and submitted via Cover My Meds. Key: [id number]

## 2023-06-18 ENCOUNTER — Telehealth (INDEPENDENT_AMBULATORY_CARE_PROVIDER_SITE_OTHER): Admitting: Pediatrics

## 2023-06-18 ENCOUNTER — Encounter: Payer: Self-pay | Admitting: Pediatrics

## 2023-06-18 ENCOUNTER — Other Ambulatory Visit: Payer: Self-pay | Admitting: Pediatrics

## 2023-06-18 DIAGNOSIS — L219 Seborrheic dermatitis, unspecified: Secondary | ICD-10-CM | POA: Diagnosis not present

## 2023-06-18 MED ORDER — KETOCONAZOLE 2 % EX CREA: 1.0000 | TOPICAL_CREAM | Freq: Every day | CUTANEOUS | 3 refills | Status: DC

## 2023-06-18 MED ORDER — DESONIDE 0.05 % EX LOTN
TOPICAL_LOTION | Freq: Two times a day (BID) | CUTANEOUS | 0 refills | Status: DC
Start: 2023-06-18 — End: 2023-06-20

## 2023-06-18 NOTE — Patient Instructions (Addendum)
 Plan: - start desonide lotion 0.05 twice daily for 1-2 weeks. You can keep this on hand for flares. - to maintain/prevent, you can use ketoconazole cream 2% once daily  Recommended cleanser: Neutrogena ultra gentle foaming cleanser

## 2023-06-18 NOTE — Progress Notes (Signed)
 Telehealth Visit  I connected with  Stefanie Stewart on 06/18/23 by a video enabled telemedicine application and verified that I am speaking with the correct person using two identifiers.   I discussed the limitations of evaluation and management by telemedicine. The patient expressed understanding and agreed to proceed.  Subjective:    Patient ID: Stefanie Stewart, female    DOB: [birth year], 48 y.o.   MRN: [id number]  HPI: Stefanie Stewart is a 48 y.o. female  Chief Complaint  Patient presents with   Rash    Pt states she had used several treatments and creams for a rash that's been moving around her face     Discussed the use of AI scribe software for clinical note transcription with the patient, who gave verbal consent to proceed.  History of Present Illness   Stefanie Stewart is a 48 year old female who presents with worsening facial rash.  She has a worsening rash on her face, characterized by swelling, redness, and inflammation, particularly around the areas where her glasses sit and around her nostrils. The rash sometimes becomes very inflamed and red, spreading across her face.  She has tried various treatments including Benadryl, hydrocortisone, eczema lotions, and a healing ointment, but has not found significant relief. The rash itches and sometimes becomes flaky or scaly by the end of the day. Its severity seems to fluctuate, potentially influenced by makeup use or other exposures.  In an effort to manage her symptoms, she has switched to using sensitive skin products, including [place] moisturizers and Almay makeup for sensitive skin. She is also using certain products to remove eye makeup and has been trying to find suitable face washes. Despite these efforts, the rash persists and is impacting her daily life, as she is having to use a lot of cover-up to conceal it.      Relevant past medical, surgical, family and social history reviewed and updated as indicated. Interim  medical history since our last visit reviewed. Allergies and medications reviewed and updated.  ROS per HPI unless specifically indicated above     Objective:    There were no vitals taken for this visit.  Wt Readings from Last 3 Encounters:  06/14/23 184 lb 3.2 oz (83.6 kg)  02/08/23 167 lb (75.8 kg)  11/16/22 151 lb 9.6 oz (68.8 kg)     Physical Exam Constitutional:      General: She is not in acute distress.    Appearance: Normal appearance.  Neurological:     General: No focal deficit present.     Mental Status: She is alert. Mental status is at baseline.      LIMITED EXAM GIVEN VIDEO VISIT     Assessment & Plan:  Assessment & Plan   Seborrheic dermatitis Assessment & Plan: Facial rash with swelling, erythema, and pruritus, primarily around nostrils and under nose. Presentation and treatment response suggest seborrheic dermatitis, though limited by telehealth evaluation. Plan to try treatment as below.  - Prescribed desonide lotion twice daily for two weeks. - Provided maintenance cream for daily use as needed. - Recommended Neutrogena foam cleanser for sensitive skin. - Advised against irritating makeup, recommended Neutrogena products.      Follow up plan: - Scheduled video follow-up on April 11th at 11:20 AM.  Jackolyn Confer, MD   This visit was completed via video visit through MyChart due to the restrictions of the COVID-19 pandemic. All issues as above were discussed and addressed. Physical exam  was done as above through visual confirmation on video through MyChart. If it was felt that the patient should be evaluated in the office, they were directed there. The patient verbally consented to this visit.  Location of the patient: work Location of the provider: work Those involved with this call:  Provider: Modena Nunnery, MD CMA:  Maggie Font, CMA Time spent on call:  15 minutes with patient face to face via video conference. More than 50% of this time  was spent in counseling and coordination of care. 15 minutes total spent in review of patient's record and preparation of their chart. Total time spent on this encounter: 30 minutes.

## 2023-06-18 NOTE — Assessment & Plan Note (Signed)
 Facial rash with swelling, erythema, and pruritus, primarily around nostrils and under nose. Presentation and treatment response suggest seborrheic dermatitis, though limited by telehealth evaluation. Plan to try treatment as below.  - Prescribed desonide lotion twice daily for two weeks. - Provided maintenance cream for daily use as needed. - Recommended Neutrogena foam cleanser for sensitive skin. - Advised against irritating makeup, recommended Neutrogena products.

## 2023-06-19 ENCOUNTER — Telehealth: Payer: Self-pay

## 2023-06-19 DIAGNOSIS — L219 Seborrheic dermatitis, unspecified: Secondary | ICD-10-CM

## 2023-06-19 NOTE — Telephone Encounter (Unsigned)
 Copied from CRM [id number]. Topic: Clinical - Prescription Issue >> Jun 19, 2023 12:52 PM Carlatta H wrote: Reason for CRM: Semaglutide,0.25 or 0.5MG /DOS, (OZEMPIC, 0.25 OR 0.5 MG/DOSE,) 2 MG/3ML SOPN [[id number]] desonide (DESOWEN) 0.05 % lotion [[id number]] ketoconazole (NIZORAL) 2 % cream [[id number]] All prescriptions need to be sent to insurance for prior authorization per pharmacy

## 2023-06-20 MED ORDER — KETOCONAZOLE 2 % EX CREA: TOPICAL_CREAM | CUTANEOUS | 3 refills | Status: DC

## 2023-06-20 MED ORDER — DESONIDE 0.05 % EX LOTN: TOPICAL_LOTION | Freq: Two times a day (BID) | CUTANEOUS | 0 refills | Status: DC

## 2023-06-20 NOTE — Telephone Encounter (Signed)
 PA approved. See other phone encounter.

## 2023-06-20 NOTE — Telephone Encounter (Signed)
 Called and LVM notifying patient of what was going on with her prescriptions. See message below for details.

## 2023-06-20 NOTE — Telephone Encounter (Signed)
 PA has been approved for the Ozempic. Per Cover My Meds, PA's are not needed for the Desonide or the Ketoconazole.   Contacted [facility] where prescriptions were sent. Pharmacy states that the PA's are not needed for the Desonide and Ketoconazole, but new prescriptions are needed for both. Both need to have in the directions how many grams per day the patient is to use in order for insurance to cover them. They need new scripts for both of these to be sent in.

## 2023-06-20 NOTE — Telephone Encounter (Signed)
  Pharmacy comment: Need specific amount of grams for insurance to pay.

## 2023-06-23 ENCOUNTER — Other Ambulatory Visit: Payer: Self-pay | Admitting: Nurse Practitioner

## 2023-06-23 DIAGNOSIS — L219 Seborrheic dermatitis, unspecified: Secondary | ICD-10-CM

## 2023-06-25 ENCOUNTER — Telehealth: Payer: Self-pay

## 2023-06-25 DIAGNOSIS — L219 Seborrheic dermatitis, unspecified: Secondary | ICD-10-CM

## 2023-06-25 NOTE — Telephone Encounter (Unsigned)
 Copied from CRM [id number]. Topic: Clinical - Prescription Issue >> Jun 25, 2023  1:14 PM Marlow Baars wrote: Reason for CRM: Bria with [facility] called in stating the insurance will not cover the ketoconazole (NIZORAL) 2 % cream stating fingertip amount as it needs to state apply a certain amount of gram instead for the area it needs to cover. Please assist as soon as possible.

## 2023-06-25 NOTE — Telephone Encounter (Signed)
 Requested medication (s) are due for refill today: see below  Requested medication (s) are on the active medication list: yes  Last refill:  06/20/23  Future visit scheduled: yes  Notes to clinic:  not delegated  Pharmacy comment: Please clarify the quantity prescribed for this prescription.       Requested Prescriptions  Pending Prescriptions Disp Refills   ketoconazole (NIZORAL) 2 % cream [Pharmacy Med Name: KETOCONAZOLE 2%     CRE] 60 g 3    Sig: APPLY FINGERTIP AMOUNT OF CREAM TOPICALLY TO AREA OF RASH AS NEEDED     Not Delegated - Over the Counter: OTC 2 Failed - 06/25/2023 12:23 PM      Failed - This refill cannot be delegated      Passed - Valid encounter within last 12 months    Recent Outpatient Visits           1 week ago Seborrheic dermatitis   [facility] Jackolyn Confer, MD   1 week ago Type 2 diabetes mellitus with proteinuria [facility])   [facility], Corrie Dandy T, NP   8 years ago Otitis, externa, infective, left   [facility] at [facility], [place], [place]   8 years ago Dizziness   [facility] at [facility], Binnie Rail, FNP   9 years ago Dysuria   [facility] Care at [facility], Harrel Lemon, MD

## 2023-06-27 MED ORDER — DESONIDE 0.05 % EX LOTN
TOPICAL_LOTION | Freq: Two times a day (BID) | CUTANEOUS | 0 refills | Status: DC
Start: 2023-06-27 — End: 2023-08-26

## 2023-06-27 MED ORDER — KETOCONAZOLE 2 % EX CREA: TOPICAL_CREAM | CUTANEOUS | 3 refills | Status: AC

## 2023-06-27 NOTE — Telephone Encounter (Signed)
 Routing to provider. Can we change the RX directions to be more specific on how much the patient is to apply? RX cannot say pea size amount, they need it to say grams.

## 2023-07-09 ENCOUNTER — Telehealth: Payer: Self-pay

## 2023-07-09 NOTE — Telephone Encounter (Unsigned)
 Copied from CRM [id number]. Topic: Clinical - Medication Question >> Jul 09, 2023  1:45 PM Zipporah Him wrote: Reason for CRM: Patient is wanting to know how hungry she should be at this point since taking her weight shots? How often is she supposed to feel hungry? constipation since day 1, OTC meds are not working. Gets full doesn't know if she needs to take more meds cause feeling hungry a lot.  No longer taking any antidepressants, now taking stuff for cortisol stress levels. Getting triggered but does not want to take any more anti depressants. Ok to leave a detailed voicemail, please call back wants to speak with a nurse or jolene from the office.

## 2023-07-11 ENCOUNTER — Ambulatory Visit: Payer: Self-pay

## 2023-07-11 NOTE — Telephone Encounter (Signed)
 Attempted to contact patient for triage. Received VMB. LVM to return call.   Copied from CRM [id number]. Topic: Clinical - Medical Advice >> Jul 11, 2023  1:41 PM Stefanie Stewart wrote: Reason for CRM: patient called stated her private area is raw, burning and itching after taking a magnesium citrate. She is asking if there is something she can take over the counter or can be prescribed for her symptoms >> Jul 11, 2023  1:48 PM Stefanie Stewart wrote: Patient is calling back in. Patient is requesting a scream cream compounded the long lasting version

## 2023-07-11 NOTE — Telephone Encounter (Signed)
 Reason for Disposition . Health Information question, no triage required and triager able to answer question  Answer Assessment - Initial Assessment Questions 1. REASON FOR CALL or QUESTION: "What is your reason for calling today?" or "How can I best help you?" or "What question do you have that I can help answer?"     Pt requesting OTC recommendations for rash to vaginal/rectal area  Protocols used: Information Only Call - No Triage-A-AH

## 2023-07-11 NOTE — Telephone Encounter (Signed)
 Called and LVM asking for patient to please return my call.   OK for E2C2 to give Jolene's message to the patient if she calls back.

## 2023-07-11 NOTE — Telephone Encounter (Addendum)
 Pt reports she has been experiencing irritation, rash to vaginal/rectal area after taking Magnesium Citrate to treat constipation from Ozempic. Pt notes she has been successful with her BM but reports feeling "raw" from all the wiping. Pt requesting OTC recommendations. Advised zinc oxide/moisture barrier cream, using externally only. .triage   2nd attempt, LVM

## 2023-07-19 ENCOUNTER — Ambulatory Visit: Payer: BC Managed Care – PPO | Admitting: Psychiatry

## 2023-07-26 ENCOUNTER — Other Ambulatory Visit: Payer: Self-pay | Admitting: Nurse Practitioner

## 2023-07-26 DIAGNOSIS — Z1231 Encounter for screening mammogram for malignant neoplasm of breast: Secondary | ICD-10-CM

## 2023-08-02 ENCOUNTER — Telehealth: Payer: Self-pay

## 2023-08-02 NOTE — Telephone Encounter (Unsigned)
 Copied from CRM [id number]. Topic: Clinical - Prescription Issue >> Aug 02, 2023  1:00 PM Ary Bitter R wrote: Reason for CRM: Pt says that she is unable to get her rx filled for Semaglutide ,0.25 or 0.5MG /DOS, (OZEMPIC , 0.25 OR 0.5 MG/DOSE,) 2 MG/3ML SOPN. Its refilling too soon at the pharmacy. She is out of injections. She is not sure what to do. Asking if the nurse can follow up on this and either contact her or the pharmacy to rectify the issue. She is due for her injection today.

## 2023-08-06 ENCOUNTER — Other Ambulatory Visit: Payer: Self-pay | Admitting: Nurse Practitioner

## 2023-08-06 NOTE — Telephone Encounter (Unsigned)
 Copied from CRM [id number]. Topic: Clinical - Medication Refill >> Aug 06, 2023  1:49 PM Oddis Bench wrote: Medication: Semaglutide ,0.25 or 0.5MG /DOS, (OZEMPIC , 0.25 OR 0.5 MG/DOSE,) 2 MG/3ML SOPN  Has the patient contacted their pharmacy? Yes Informed to contact office  This is the patient's preferred pharmacy:  [facility] [address] [place] - [address] [place] [address] Phone: [phone] Fax: [phone]    Is this the correct pharmacy for this prescription? {yes/no:20286} If no, delete pharmacy and type the correct one.   Has the prescription been filled recently? Yes  Is the patient out of the medication? Yes  Has the patient been seen for an appointment in the last year OR does the patient have an upcoming appointment? Yes  Can we respond through MyChart? Yes  Agent: Please be advised that Rx refills may take up to 3 business days. We ask that you follow-up with your pharmacy.

## 2023-08-07 DIAGNOSIS — R300 Dysuria: Secondary | ICD-10-CM | POA: Diagnosis not present

## 2023-08-07 DIAGNOSIS — N762 Acute vulvitis: Secondary | ICD-10-CM | POA: Diagnosis not present

## 2023-08-07 DIAGNOSIS — B3731 Acute candidiasis of vulva and vagina: Secondary | ICD-10-CM | POA: Diagnosis not present

## 2023-08-07 MED ORDER — OZEMPIC (0.25 OR 0.5 MG/DOSE) 2 MG/3ML ~~LOC~~ SOPN: 0.5000 mg | PEN_INJECTOR | SUBCUTANEOUS | 4 refills | Status: AC

## 2023-08-16 ENCOUNTER — Ambulatory Visit (INDEPENDENT_AMBULATORY_CARE_PROVIDER_SITE_OTHER): Payer: BC Managed Care – PPO | Admitting: Psychiatry

## 2023-08-16 DIAGNOSIS — Z91199 Patient's noncompliance with other medical treatment and regimen due to unspecified reason: Secondary | ICD-10-CM

## 2023-08-16 NOTE — Progress Notes (Deleted)
 Psychotherapy Progress Note [facility] Delora Ferry, PhD LP  Patient ID: Stefanie Stewart)    MRN: [id number] Therapy format: {Therapy Types:21967::"Individual psychotherapy"} Date: 08/16/2023      Start: ***:***     Stop: ***:***     Time Spent: *** min Location: {SvcLoc:22530::"In-person"}   Session narrative (presenting needs, interim history, self-report of stressors and symptoms, applications of prior therapy, status changes, and interventions made in session) Scheduled TC made 10:11a, no answer, left message to call back.    Therapeutic modalities: {AM:23362::"Cognitive Behavioral Therapy","Solution-Oriented/Positive Psychology"}  Mental Status/Observations:  Appearance:   {PSY:22683}     Behavior:  {PSY:21022743}  Motor:  {PSY:22302}  Speech/Language:   {PSY:22685}  Affect:  {PSY:22687}  Mood:  {PSY:31886}  Thought process:  {PSY:31888}  Thought content:    {[phone]}  Sensory/Perceptual disturbances:    {[phone]}  Orientation:  {Psych Orientation:23301::"Fully oriented"}  Attention:  {Good-Fair-Poor ratings:23770::"Good"}    Concentration:  {Good-Fair-Poor ratings:23770::"Good"}  Memory:  {[phone]}  Insight:    {Good-Fair-Poor ratings:23770::"Good"}  Judgment:   {Good-Fair-Poor ratings:23770::"Good"}  Impulse Control:  {Good-Fair-Poor ratings:23770::"Good"}   Risk Assessment: Danger to Self: {Risk:22599::"No"} Self-injurious Behavior: {Risk:22599::"No"} Danger to Others: {Risk:22599::"No"} Physical Aggression / Violence: {Risk:22599::"No"} Duty to Warn: {AMYesNo:22526::"No"} Access to Firearms a concern: {AMYesNo:22526::"No"}  Assessment of progress:  {Progress:22147::"progressing"}  Diagnosis: No diagnosis found. Plan:  *** Other recommendations/advice -- As may be noted above.  Continue to utilize previously learned skills ad lib. Medication compliance -- Maintain medication as prescribed and work faithfully with  relevant prescriber(s) if any changes are desired or seem indicated. Crisis service -- Aware of call list and work-in appts.  Call the clinic on-call service, 988/hotline, 911, or present to [facility] or ER if any life-threatening psychiatric crisis. Followup -- No follow-ups on file.  Next scheduled visit with me 09/13/2023.  Next scheduled in this office 09/13/2023.  [name], PhD Delora Ferry, PhD LP Clinical Psychologist, [facility] [address], [address] [place], [place] [address] [phone]

## 2023-08-16 NOTE — Progress Notes (Signed)
 No-show/Short-notice cancellation note Delora Ferry, PhD, [facility]  Patient ID: Stefanie Stewart     MRN: [id number]     Date: 08/16/2023     Appt time: 10am (phone)  Scheduled TC made 10:11a, no answer, left message to call back, message not returned, no message received in front office.  Reduced charge for no show, may reschedule  [name], PhD Delora Ferry, PhD LP Clinical Psychologist, [facility] [address] [name], [place] [address] [phone]

## 2023-08-26 ENCOUNTER — Other Ambulatory Visit (HOSPITAL_COMMUNITY): Payer: Self-pay

## 2023-08-26 ENCOUNTER — Other Ambulatory Visit: Payer: Self-pay | Admitting: Nurse Practitioner

## 2023-08-26 ENCOUNTER — Telehealth: Payer: Self-pay

## 2023-08-26 MED ORDER — DESONIDE 0.05 % EX CREA
TOPICAL_CREAM | Freq: Two times a day (BID) | CUTANEOUS | 5 refills | Status: AC
Start: 2023-08-26 — End: ?

## 2023-08-26 NOTE — Telephone Encounter (Signed)
 Pharmacy Patient Advocate Encounter   Received notification from Onbase that prior authorization for Desonide  0.05% lotion is required/requested.   Insurance verification completed.   The patient is insured through [facility] .   Per test claim:  Desonide  0.05% cream is preferred by the insurance.  If suggested medication is appropriate, Please send in a new RX and discontinue this one. If not, please advise as to why it's not appropriate so that we may request a Prior Authorization. Please note, some preferred medications may still require a PA.  If the suggested medications have not been trialed and there are no contraindications to their use, the PA will not be submitted, as it will not be approved.

## 2023-08-26 NOTE — Addendum Note (Signed)
 Addended by: Tanekia Ryans T on: 08/26/2023 09:11 AM   Modules accepted: Orders

## 2023-08-27 ENCOUNTER — Ambulatory Visit: Payer: Self-pay

## 2023-08-27 NOTE — Telephone Encounter (Signed)
 Requested medication (s) are due for refill today: yes  Requested medication (s) are on the active medication list: yes  Last refill:  02/27/21 #30  Future visit scheduled: yes  Notes to clinic:  old prescription    Requested Prescriptions  Pending Prescriptions Disp Refills   methocarbamol  (ROBAXIN ) 500 MG tablet [Pharmacy Med Name: Methocarbamol  500 MG Oral Tablet] 30 tablet 0    Sig: TAKE 1 TABLET BY MOUTH EVERY 8 HOURS AS NEEDED FOR MUSCLE SPASM     Not Delegated - Analgesics:  Muscle Relaxants Failed - 08/27/2023  4:11 PM      Failed - This refill cannot be delegated      Passed - Valid encounter within last 6 months    Recent Outpatient Visits           2 months ago Seborrheic dermatitis   [name] Hadassah Letters, MD   2 months ago Type 2 diabetes mellitus with proteinuria (HCC)   [facility], Sanjuan Crumbly T, NP   8 years ago Otitis, externa, infective, left   [facility] [place], [place]   8 years ago Dizziness   [facility], Rexine Cater, FNP   9 years ago Dysuria   [facility] at [facility], Renata Dargan, MD

## 2023-08-27 NOTE — Telephone Encounter (Signed)
 Copied From CRM [id number]. Reason for Triage: Pt had severe diarrhea a few weeks, possibly due to Ozempic . She stopped taking it for a week and hadn't had any problems. She just took the shot again, but not sure what's going to happen. She doesn't know what dosage she is supposed to be on for the Ozempic  either. She said she has called about this several times before on an after hours line, but never got a call back. Please call patient at [phone].   Would like a call back from the office; States had diarrhea last week and stopped ozempic  thinking that was the issue;  Diarrhea has stopped (Monday) and she took her dose.  She wants to know if she should continue with the 0.5 dose.  Requesting a call back.  Reason for Disposition  MILD-MODERATE diarrhea (e.g., 1-6 times / day more than normal)  Answer Assessment - Initial Assessment Questions 1. DIARRHEA SEVERITY: "How bad is the diarrhea?" "How many more stools have you had in the past 24 hours than normal?"    - NO DIARRHEA (SCALE 0)   - MILD (SCALE 1-3): Few loose or mushy BMs; increase of 1-3 stools over normal daily number of stools; mild increase in ostomy output.   -  MODERATE (SCALE 4-7): Increase of 4-6 stools daily over normal; moderate increase in ostomy output.   -  SEVERE (SCALE 8-10; OR "WORST POSSIBLE"): Increase of 7 or more stools daily over normal; moderate increase in ostomy output; incontinence.     Happened on Thursday; states explosive diarrhea 2. ONSET: "When did the diarrhea begin?"      Thursday and stopped on Monday; states did not take the ozempic  3. BM CONSISTENCY: "How loose or watery is the diarrhea?"      watery 4. VOMITING: "Are you also vomiting?" If Yes, ask: "How many times in the past 24 hours?"      no 5. ABDOMEN PAIN: "Are you having any abdomen pain?" If Yes, ask: "What does it feel like?" (e.g., crampy, dull, intermittent, constant)      denies 6. ABDOMEN PAIN SEVERITY: If present, ask: "How bad is the  pain?"  (e.g., Scale 1-10; mild, moderate, or severe)   - MILD (1-3): doesn't interfere with normal activities, abdomen soft and not tender to touch    - MODERATE (4-7): interferes with normal activities or awakens from sleep, abdomen tender to touch    - SEVERE (8-10): excruciating pain, doubled over, unable to do any normal activities       denies 7. ORAL INTAKE: If vomiting, "Have you been able to drink liquids?" "How much liquids have you had in the past 24 hours?"     Denies vomiting 8. HYDRATION: "Any signs of dehydration?" (e.g., dry mouth [not just dry lips], too weak to stand, dizziness, new weight loss) "When did you last urinate?"     denies 9. EXPOSURE: "Have you traveled to a foreign country recently?" "Have you been exposed to anyone with diarrhea?" "Could you have eaten any food that was spoiled?"     na 10. ANTIBIOTIC USE: "Are you taking antibiotics now or have you taken antibiotics in the past 2 months?"       no 11. OTHER SYMPTOMS: "Do you have any other symptoms?" (e.g., fever, blood in stool)       na 12. PREGNANCY: "Is there any chance you are pregnant?" "When was your last menstrual period?"       na  Protocols used: [facility]

## 2023-08-27 NOTE — Telephone Encounter (Signed)
 Called and notified patient of Jolene's message.

## 2023-08-30 ENCOUNTER — Ambulatory Visit
Admission: RE | Admit: 2023-08-30 | Discharge: 2023-08-30 | Disposition: A | Discharge status: Home / Self Care | Source: Ambulatory Visit | Attending: Nurse Practitioner | Admitting: Nurse Practitioner

## 2023-08-30 DIAGNOSIS — Z1231 Encounter for screening mammogram for malignant neoplasm of breast: Secondary | ICD-10-CM | POA: Diagnosis not present

## 2023-09-04 ENCOUNTER — Ambulatory Visit: Payer: Self-pay | Admitting: Nurse Practitioner

## 2023-09-04 NOTE — Progress Notes (Signed)
 Contacted via MyChart   Normal mammogram, may repeat in one year:)

## 2023-09-13 ENCOUNTER — Ambulatory Visit: Payer: BC Managed Care – PPO | Admitting: Psychiatry

## 2023-09-13 DIAGNOSIS — F325 Major depressive disorder, single episode, in full remission: Secondary | ICD-10-CM | POA: Diagnosis not present

## 2023-09-13 DIAGNOSIS — E1169 Type 2 diabetes mellitus with other specified complication: Secondary | ICD-10-CM

## 2023-09-13 DIAGNOSIS — F411 Generalized anxiety disorder: Secondary | ICD-10-CM | POA: Diagnosis not present

## 2023-09-13 DIAGNOSIS — Z630 Problems in relationship with spouse or partner: Secondary | ICD-10-CM | POA: Diagnosis not present

## 2023-09-13 DIAGNOSIS — N951 Menopausal and female climacteric states: Secondary | ICD-10-CM | POA: Diagnosis not present

## 2023-09-13 NOTE — Progress Notes (Signed)
 Psychotherapy Progress Note [facility] [name], PhD LP  Patient ID: Stefanie Stewart)    MRN: [id number] Therapy format: Individual psychotherapy Date: 09/13/2023      Start: 10:08a     Stop: 10:59a     Time Spent: 51 min Location: In-person   Session narrative (presenting needs, interim history, self-report of stressors and symptoms, applications of prior therapy, status changes, and interventions made in session) Apologetic for the miss last month, glad to be in person.  Many things to deal with today.  Now married to [place] (4/25), successfully honeymooned, then returned just ahead of Mother's Day weekend to discover his mom was in hospital with pneumonia, and lung cancer, then she died within days.  Met many family members during her decline, took extra time off work on request to support [name], while his kids seemed to create extra drama with some of them seemingly estranged from their father.  Not too long after the memorial service -- the timing of which was itself a bone of family contention -- [name] admitted one night to being a high functioning alcoholic, confessing I duped you, and articulating a need for help and fear that it will take hospital detox and be a $20-30K expense to get well.  Honestly stated that he has been self-medicating for PTSD from the [place], knows he has to overcome the addiction to be truly well, and sorry he is putting her in the position of having to reckon with again being in relationship with an alcoholic.  Much to take in, obviously, but overall, she's been sanguine about it, recognizing his honesty and benevolence in telling her, though shaken further by him soon saying he shouldn't have told her.    Interpreted confessor's anxiety, not any knowing manipulation, in his approach to telling her.  Reinforced trust in his good intentions, temporary or permanent, and praised her steadfastness moving through rapid change and getting  acquainted with unknown problems.  Discussed her approach to it, endorsing freedom to ask [name] about his own fact-finding how he might do a detox and recovery.  Given prior experience and concern for his health, endorsed asking him if he's willing to get his liver checked out in case of occult disease.  Informed of options beyond a standard 28-day hospitalization, if the cost he is reporting intimidates either of them.  He has already offered he's willing to sell property to cover his cost, which is, of course, reassuring.  Endorsed having frank conversations and self-monitor any urges to ask too many questions at once or to preach, which she is already pretty secure against.  Meanwhile, confides a problem with being unable to have orgasms with [name].  Able to bring them about herself, only by herself, and wants referral to a sex therapist.  Briefed on most likely therapy approach, to include sensate focus exercises, provided copy of a workbook version, and gave initial referral to [name] for a known sensitive female guide who is specialty certified    Re friend Clotilda -- opted to let silence happen a while then tried to get in touch, not working out so well, still prone to project negativity to [place].  Endorsed choice what to do with long friendship turning sour.    Therapeutic modalities: Cognitive Behavioral Therapy, Solution-Oriented/Positive Psychology, and Ego-Supportive  Mental Status/Observations:  Appearance:   Casual     Behavior:  Appropriate  Motor:  Normal  Speech/Language:   Clear and Coherent  Affect:  Appropriate  Mood:  anxious  Thought process:  circumstantial  Thought content:    WNL  Sensory/Perceptual disturbances:    WNL  Orientation:  Fully oriented  Attention:  Good    Concentration:  Fair  Memory:  WNL  Insight:    Good  Judgment:   Variable  Impulse Control:  Good   Risk Assessment: Danger to Self: No Self-injurious Behavior: No Danger to Others:  No Physical Aggression / Violence: No Duty to Warn: No Access to Firearms a concern: No  Assessment of progress:  progressing  Diagnosis:   ICD-10-CM   1. Generalized anxiety disorder  F41.1     2. 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 Relationship problem between partners  Z63.0     4. 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 Menopause syndrome  N95.1      Plan:  [name] -- Endorse the marriage.  Investigate alternatives for alcohol treatment, may be able to get through with IOP and AA rather than detox.  Referrals provided.  Re communication, , emphasize being ready to recognize anxiety about vulnerability and commitment as separate from actual reasons to doubt each other.  Where needed, focus on it being about his health and freedom from emotional upset due to physiological disruptions, then how it's the fulfillment of what they have just promised to each other in marriage and what they both hope for in second marriage after hurts.  Practice suspending snap judgments, willingness to check perceptions, assertively asking for things, and taking turns.  For any suspected hormonal issues affecting sex, see gynecologist or PC.  For sexual functioning, referral to certified female counselor, recommendation to self-help sensate focus exercises. Finances --  Budget effectively, investigate options for self-loan as needed Health -- Continue good diabetes care, reasonable diet & exercise, fade aggressive weight loss plan to more normal diet with control.  Recommend ongoing carb control for emotional equilibrium and menopause support, especially with suspected insulin resistance.  Employ keto and/or Mediterranean strategies.  Option OA if in search of support and help mastering compulsive eating. Occupational stress -- Continue integrating into the new work environment.  Try to worry  less what people think of her and bring her own peace of mind to interactions.  Continue picking battles about features of the work environment or process to challenge.  Family/friend dynamics -- Continue communication advice with intrusive or otherwise pressuring family/friends.  Watch people-pleasing tendencies but seek to correct them by giving herself room to decided whether to do something, not indulge a need to push back, i.e., get comfortable enough with yes, no, or not right now to speak them without having to defend.  With [place] in particular, try to offer a time of nonjudgmental, nondefensive listening to draw out what's wrong, hopefully clarify her negative reaction to engagement, and both ask and portray how it is OK to disagree, let's just try to understand. Self-soothing techniques -- Mindful minute, listen to ambient noise (clock, air vent), try listening to a metronome app and playing with the tempo Worry/catastrophizing -- Social: continue letting people draw their own conclusions, explain changes and attitudes at discretion.  Safety: for imagined horrors like predatory crime, try to see deterrents from the predator's perspective, and self-defense measures she could take Other recommendations/advice -- As may be noted above.  Continue to utilize previously learned skills ad lib.  Medication compliance -- Maintain medication as prescribed and work faithfully with relevant prescriber(s) if any changes are desired or seem indicated. Crisis service -- Aware of call list and work-in appts.  Call the clinic on-call service, 988/hotline, 911, or present to [facility] or ER if any life-threatening psychiatric crisis. Followup -- No follow-ups on file.  Next scheduled visit with me 10/11/2023.  Next scheduled in this office 10/11/2023.  Lamar Kendall, PhD [name], PhD LP Clinical Psychologist, [facility] [address], [address] [place], [place] [address] [phone]

## 2023-09-18 ENCOUNTER — Telehealth: Payer: Self-pay | Admitting: Psychiatry

## 2023-09-18 NOTE — Telephone Encounter (Signed)
 Pt called reporting therapist you referred to is on maternity leave. Requesting another therapist. RTC ok to leave message [phone]

## 2023-09-18 NOTE — Telephone Encounter (Signed)
 RTC to [place], left VM.  No direct knowledge of sex therapists in the area, but nbasd on online search, optimistic about the [facility] on [address] [place], ph [phone].

## 2023-09-24 DIAGNOSIS — D225 Melanocytic nevi of trunk: Secondary | ICD-10-CM | POA: Diagnosis not present

## 2023-09-24 DIAGNOSIS — L821 Other seborrheic keratosis: Secondary | ICD-10-CM | POA: Diagnosis not present

## 2023-09-24 DIAGNOSIS — L710 Perioral dermatitis: Secondary | ICD-10-CM | POA: Diagnosis not present

## 2023-09-24 DIAGNOSIS — L814 Other melanin hyperpigmentation: Secondary | ICD-10-CM | POA: Diagnosis not present

## 2023-10-04 DIAGNOSIS — F419 Anxiety disorder, unspecified: Secondary | ICD-10-CM | POA: Diagnosis not present

## 2023-10-08 ENCOUNTER — Telehealth: Payer: Self-pay

## 2023-10-08 NOTE — Telephone Encounter (Signed)
 Copied from CRM [id number]. Topic: General - Other >> Oct 08, 2023 11:13 AM Marissa P wrote: Reason for CRM: Patient wanted to leave a message for the nurse that she may need to go up some mg for the ozempic  or to change the prescription because she is not seeing any changes.

## 2023-10-08 NOTE — Telephone Encounter (Signed)
 Routing to provider. Can this be increased or does the patient need to come in and discuss first?

## 2023-10-09 NOTE — Telephone Encounter (Signed)
 Please call and schedule the patient an appointment per Jolene if she would like to discuss changing her medication dosage.

## 2023-10-09 NOTE — Telephone Encounter (Signed)
 Called patient and left a message for her to call back to get scheduled.

## 2023-10-11 ENCOUNTER — Ambulatory Visit: Payer: BC Managed Care – PPO | Admitting: Psychiatry

## 2023-10-11 DIAGNOSIS — F411 Generalized anxiety disorder: Secondary | ICD-10-CM

## 2023-10-11 DIAGNOSIS — Z630 Problems in relationship with spouse or partner: Secondary | ICD-10-CM

## 2023-10-11 DIAGNOSIS — Z566 Other physical and mental strain related to work: Secondary | ICD-10-CM

## 2023-10-11 DIAGNOSIS — N951 Menopausal and female climacteric states: Secondary | ICD-10-CM | POA: Diagnosis not present

## 2023-10-11 DIAGNOSIS — F325 Major depressive disorder, single episode, in full remission: Secondary | ICD-10-CM | POA: Diagnosis not present

## 2023-10-11 DIAGNOSIS — E1169 Type 2 diabetes mellitus with other specified complication: Secondary | ICD-10-CM

## 2023-10-11 NOTE — Progress Notes (Signed)
 Psychotherapy Progress Note [facility] [name], PhD LP  Patient ID: Stefanie Stewart)    MRN: [id number] Therapy format: Individual psychotherapy Date: 10/11/2023      Start: 10:21a     Stop: 11:05a     Time Spent: 44 min Location: In-person   Session narrative (presenting needs, interim history, self-report of stressors and symptoms, applications of prior therapy, status changes, and interventions made in session) Frustrated with sexual response, has had first visit with a sex therapist, Elvie Beady, whom she says will want to be in touch.    Concerned still for [name] and his alcohol habit.  He has been to the [place], trying to work out what order to work in for his back, tests, and detox.  Apparently he is a bit more medically complicated.  Bothered by him pestering her now to know everything from her, acting possessive, even accusing her of sleeping with her boss, which she finds maddening off-base and sparking new fears of whether she got in too deep.  Reportedly [name] said to her (possibly intoxicated?) You seem like you just had a fight with your lover when she got home stressed from work.  Puppy at home, too, stressful.  And Cassiopeia is off meds and discontent with her body, plus damage to the kitchen from the puppy, and the expense of renovations.  Allowed to vent, support/validation provided.  , and encouraged in turning the question back if she gets a similar accusation, mainly by checking message (You're talking like you think I'm seeing somebody -- are you trying to accuse something?) since she may be reading in his paranoia, actually, and mistaking guilt over the harm he's caused -- something he has expressed, too strenuously.  If need be, can ask what makes her seem like it'd be about that.  Clear from the sound of it that home in the evenings is also becoming too much commotion and worry triggers with jimmy smoking, drinking, and on the phone.  Encouraged  in assertiveness about what bothers if she can restrain herself to one subject and not get baited into a tit for tat or a victim contest.  Therapeutic modalities: Cognitive Behavioral Therapy, Solution-Oriented/Positive Psychology, Environmental manager, and Assertiveness/Communication  Mental Status/Observations:  Appearance:   Casual     Behavior:  Appropriate  Motor:  Normal  Speech/Language:   Clear and Coherent and some pressure  Affect:  Appropriate  Mood:  anxious  Thought process:  normal and mild flight  Thought content:    Rumination  Sensory/Perceptual disturbances:    WNL  Orientation:  Fully oriented  Attention:  Good    Concentration:  Fair  Memory:  WNL  Insight:    Variable  Judgment:   Good  Impulse Control:  Good   Risk Assessment: Danger to Self: No Self-injurious Behavior: No Danger to Others: No Physical Aggression / Violence: No Duty to Warn: No Access to Firearms a concern: No  Assessment of progress:  situational setback(s)  Diagnosis:   ICD-10-CM   1. Generalized anxiety disorder  F41.1     2. 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 Relationship problem between partners  Z63.0     4. Menopause syndrome  N95.1     5. Work stress  Z56.6     6. 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  Investigate alternatives for alcohol treatment, may be able to get through with IOP and AA rather than detox.  Referrals provided.  Re communication, , emphasize being ready to recognize anxiety about vulnerability and commitment as separate from actual reasons to doubt each other.  Where needed, focus on it being about his health and freedom from emotional upset due to physiological disruptions, then how it's the fulfillment of what they have just promised to each other in marriage and what they both hope for in second  marriage after hurts.  Practice suspending snap judgments, willingness to check perceptions, assertively asking for things, and taking turns.   Health -- Continue good diabetes care, reasonable diet & exercise, fade aggressive weight loss plan to more normal diet with control.  Recommend ongoing carb control for emotional equilibrium and menopause support, especially with suspected insulin resistance.  Employ keto and/or Mediterranean strategies.  Option OA if in search of support and help mastering compulsive eating.  For sexual functioning, address any suspected hormonal issues with gynecologist or PCP, and continue with designated sex therapist.  Available to clarify and augment helpful information and guidance beyond use of self-help sensate focus exercises. Financial stress --  Budget effectively, investigate options for self-loan as needed Occupational stress -- Continue integrating into the new work environment.  Try to worry less what people think of her and bring her own peace of mind to interactions.  Continue picking battles about features of the work environment or process to challenge.  Family/friend dynamics -- Continue communication advice with intrusive or otherwise pressuring family/friends.  Watch people-pleasing tendencies but seek to correct them by giving herself room to decided whether to do something, not indulge a need to push back, i.e., get comfortable enough with yes, no, or not right now to speak them without having to defend.  With [place] in particular, try to offer a time of nonjudgmental, nondefensive listening to draw out what's wrong, hopefully clarify her negative reaction to engagement, and both ask and portray how it is OK to disagree, let's just try to understand. Self-soothing techniques -- Mindful minute, listen to ambient noise (clock, air vent), try listening to a metronome app and playing with the tempo Worry/catastrophizing -- Social: continue letting people draw  their own conclusions, explain changes and attitudes at discretion.  Safety: for imagined horrors like predatory crime, try to see deterrents from the predator's perspective, and self-defense measures she could take Other recommendations/advice -- As may be noted above.  Continue to utilize previously learned skills ad lib. Medication compliance -- Maintain medication as prescribed and work faithfully with relevant prescriber(s) if any changes are desired or seem indicated. Crisis service -- Aware of call list and work-in appts.  Call the clinic on-call service, 988/hotline, 911, or present to [facility] or ER if any life-threatening psychiatric crisis. Followup -- Return for time at discretion.  Next scheduled visit with me 11/08/2023.  Next scheduled in this office 11/08/2023.  Lamar Kendall, PhD [name], PhD LP Clinical Psychologist, [facility] [address], [address] [place], [place] [address] [phone]

## 2023-10-16 NOTE — Telephone Encounter (Signed)
 Called patient and left a message for her to call back to get scheduled for an appointment in any open spot to discuss changing her medication.

## 2023-10-18 DIAGNOSIS — N952 Postmenopausal atrophic vaginitis: Secondary | ICD-10-CM | POA: Diagnosis not present

## 2023-10-18 DIAGNOSIS — Z1331 Encounter for screening for depression: Secondary | ICD-10-CM | POA: Diagnosis not present

## 2023-10-18 DIAGNOSIS — F419 Anxiety disorder, unspecified: Secondary | ICD-10-CM | POA: Diagnosis not present

## 2023-10-18 DIAGNOSIS — Z01411 Encounter for gynecological examination (general) (routine) with abnormal findings: Secondary | ICD-10-CM | POA: Diagnosis not present

## 2023-10-18 DIAGNOSIS — N809 Endometriosis, unspecified: Secondary | ICD-10-CM | POA: Diagnosis not present

## 2023-10-18 DIAGNOSIS — Z01419 Encounter for gynecological examination (general) (routine) without abnormal findings: Secondary | ICD-10-CM | POA: Diagnosis not present

## 2023-11-01 DIAGNOSIS — E559 Vitamin D deficiency, unspecified: Secondary | ICD-10-CM | POA: Diagnosis not present

## 2023-11-01 DIAGNOSIS — E782 Mixed hyperlipidemia: Secondary | ICD-10-CM | POA: Diagnosis not present

## 2023-11-01 DIAGNOSIS — E1165 Type 2 diabetes mellitus with hyperglycemia: Secondary | ICD-10-CM | POA: Diagnosis not present

## 2023-11-08 ENCOUNTER — Ambulatory Visit: Payer: BC Managed Care – PPO | Admitting: Psychiatry

## 2023-11-08 DIAGNOSIS — E559 Vitamin D deficiency, unspecified: Secondary | ICD-10-CM | POA: Diagnosis not present

## 2023-11-08 DIAGNOSIS — E782 Mixed hyperlipidemia: Secondary | ICD-10-CM | POA: Diagnosis not present

## 2023-11-08 DIAGNOSIS — E1165 Type 2 diabetes mellitus with hyperglycemia: Secondary | ICD-10-CM | POA: Diagnosis not present

## 2023-11-15 DIAGNOSIS — E782 Mixed hyperlipidemia: Secondary | ICD-10-CM | POA: Diagnosis not present

## 2023-11-15 DIAGNOSIS — E1165 Type 2 diabetes mellitus with hyperglycemia: Secondary | ICD-10-CM | POA: Diagnosis not present

## 2023-12-06 ENCOUNTER — Ambulatory Visit: Admitting: Psychiatry

## 2023-12-06 DIAGNOSIS — Z566 Other physical and mental strain related to work: Secondary | ICD-10-CM

## 2023-12-06 DIAGNOSIS — F411 Generalized anxiety disorder: Secondary | ICD-10-CM | POA: Diagnosis not present

## 2023-12-06 DIAGNOSIS — N951 Menopausal and female climacteric states: Secondary | ICD-10-CM

## 2023-12-06 DIAGNOSIS — Z630 Problems in relationship with spouse or partner: Secondary | ICD-10-CM | POA: Diagnosis not present

## 2023-12-06 DIAGNOSIS — F325 Major depressive disorder, single episode, in full remission: Secondary | ICD-10-CM

## 2023-12-06 DIAGNOSIS — E1169 Type 2 diabetes mellitus with other specified complication: Secondary | ICD-10-CM

## 2023-12-06 NOTE — Progress Notes (Signed)
 Psychotherapy Progress Note [facility] [name], PhD LP  Patient ID: Stefanie Stewart)    MRN: [id number] Therapy format: Individual psychotherapy Date: 12/06/2023      Start: 10:04a     Stop: 10:50a     Time Spent: 46 min Location: In-person   Session narrative (presenting needs, interim history, self-report of stressors and symptoms, applications of prior therapy, status changes, and interventions made in session) Stefanie Stewart went to [place], on her [birth year] ([birth year]), had followed advice to do his own OP detox, attended the residential program, and there was a COVID outbreak.  Elected to come home early, been home neither drinking nor smoking, just some vape.  He did gain 13 lbs in 13 days.  Since home, he's searching for things to do to fill time.  Tried to get Chant[facility] but [facility] blew it off.  Not clear he will re-engage inpatient addictions program nor outpatient.  Today a further challenge, with Stefanie Stewart unexpectedly home, and Stefanie Stewart keeping a preplanned lunch date with friends.  Feeling awkward in the face of suspicious comments about her meeting someone, she compulsively offered to him to come along to meet her friends, without clearing it with them first, and one of them has not responded to her asking.  Discussed approach to the occasion and the likelihood that the silent friend may feel unfairly dealt with by it.  Encouraged to be willing to address Stefanie Stewart, too, that at some point he has to be ready to dispute his own feelings, just as she will need to refuse compulsions like that so she doesn't distort her own marriage.  Dissatisfied with sex therapist, saw twice at $150 per session OOP, and she failed to follow through on a pledge to send material on body image.  Support/validation provided, offered to assist with more reliable referral.    Meanwhile, Stefanie Stewart the puppy got worms, and he jumps all over them.  At one point, she actually had to handle a defecated  tapeworm, and the treatment is expensive.  He is in obedience class, but behind schedule and anxious about seeming to have failed.  Coached in addressing it with the class instructor to make a strategic decision to alternate training, supplemental training, or wait and reenroll, depending.  In any event, will require some individual owner coaching.  Has had recommendation to get a shock collar, but uncomfortable with that.  Advised on personal experience to employ attention and reward based program such as [employer] and described potent training methods for shifting jumping and pestering to patiently watching, and wait for it training.  Job is wearing on her further now.  Schedule has meant extended periods of full time 4x10 schedule.  Has been laboring under the sense that she is expected to hop to it whenever there's any dull moment in the clinic, but a speeding ticket in June, and turning 48 2 wks ago, turned her mindset, figuring now unrealistic instructions can just go in the fuck it bucket.  Discussed telling the difference between worth it and not worth it requests from the boss.  Adds that her direct report dentist really has a grating habit of externalizing his stress, e.g., sighing and balking at being asked about things, and dumping multiple tasks at once, often exceeding working memory or available time when she is expected to be off the clock by a certain time.  Endorsed being ready to reflect back double standards and ask  which to follow and to otherwise ask for clarifications if she would otherwise risk seeming to have a bad attitude.  Therapeutic modalities: Cognitive Behavioral Therapy, Solution-Oriented/Positive Psychology, Ego-Supportive, and Assertiveness/Communication  Mental Status/Observations:  Appearance:   Casual     Behavior:  Appropriate  Motor:  Normal  Speech/Language:   Clear and Coherent  Affect:  Appropriate  Mood:  anxious and irritable with  subject  Thought process:  Some flight  Thought content:    WNL  Sensory/Perceptual disturbances:    WNL  Orientation:  Fully oriented  Attention:  Good    Concentration:  Fair  Memory:  WNL  Insight:    Good  Judgment:   Fair  Impulse Control:  Good   Risk Assessment: Danger to Self: No Self-injurious Behavior: No Danger to Others: No Physical Aggression / Violence: No Duty to Warn: No Access to Firearms a concern: No  Assessment of progress:  stabilized  Diagnosis:   ICD-10-CM   1. Generalized anxiety disorder  F41.1     2. 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 Relationship problem between partners  Z63.0     4. Menopause syndrome  N95.1     5. Work stress  Z56.6     6. 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  Investigate alternatives for alcohol treatment, may be able to get through with IOP and AA rather than detox.  Referrals provided.  Re communication, , emphasize being ready to recognize anxiety about vulnerability and commitment as separate from actual reasons to doubt each other.  Where needed, focus on it being about his health and freedom from emotional upset due to physiological disruptions, then how it's the fulfillment of what they have just promised to each other in marriage and what they both hope for in second marriage after hurts.  Practice suspending snap judgments, willingness to check perceptions, assertively asking for things, and taking turns.   Health -- Continue good diabetes care, reasonable diet & exercise, fade aggressive weight loss plan to more normal diet with control.  Recommend ongoing carb control for emotional equilibrium and menopause support, especially with suspected insulin resistance.  Employ keto and/or Mediterranean strategies.  Option OA if in search of support and help mastering  compulsive eating.  For sexual functioning, address any suspected hormonal issues with gynecologist or PCP, and continue with designated sex therapist.  Available to clarify and augment helpful information and guidance beyond use of self-help sensate focus exercises. Financial stress --  Budget effectively, investigate options for self-loan as needed Occupational stress -- Continue integrating into the new work environment.  Try to worry less what people think of her and bring her own peace of mind to interactions.  Continue picking battles about features of the work environment or process to challenge.  Family/friend dynamics -- Continue communication advice with intrusive or otherwise pressuring family/friends.  Watch people-pleasing tendencies but seek to correct them by giving herself room to decided whether to do something, not indulge a need to push back, i.e., get comfortable enough with yes, no, or not right now to speak them without having to defend.  With Stefanie Stewart in particular, try to offer a time of nonjudgmental, nondefensive listening to draw out what's wrong, hopefully clarify her negative reaction to engagement, and both ask and portray how it is  OK to disagree, let's just try to understand. Self-soothing techniques -- Mindful minute, listen to ambient noise (clock, air vent), try listening to a metronome app and playing with the tempo Worry/catastrophizing -- Social: continue letting people draw their own conclusions, explain changes and attitudes at discretion.  Safety: for imagined horrors like predatory crime, try to see deterrents from the predator's perspective, and self-defense measures she could take Other recommendations/advice -- As may be noted above.  Continue to utilize previously learned skills ad lib. Medication compliance -- Maintain medication as prescribed and work faithfully with relevant prescriber(s) if any changes are desired or seem indicated. Crisis service -- Aware of  call list and work-in appts.  Call the clinic on-call service, 988/hotline, 911, or present to [facility] or ER if any life-threatening psychiatric crisis. Followup -- Return for time as already scheduled.  Next scheduled visit with me 01/10/2024.  Next scheduled in this office 01/10/2024.  Lamar Kendall, PhD [name], PhD LP Clinical Psychologist, [facility] [address], [address] [place], [place] [address] [phone]

## 2023-12-13 DIAGNOSIS — E119 Type 2 diabetes mellitus without complications: Secondary | ICD-10-CM | POA: Diagnosis not present

## 2023-12-13 DIAGNOSIS — H5213 Myopia, bilateral: Secondary | ICD-10-CM | POA: Diagnosis not present

## 2023-12-13 DIAGNOSIS — H52203 Unspecified astigmatism, bilateral: Secondary | ICD-10-CM | POA: Diagnosis not present

## 2023-12-20 ENCOUNTER — Ambulatory Visit: Admitting: Nurse Practitioner

## 2023-12-25 ENCOUNTER — Ambulatory Visit: Admitting: Nurse Practitioner

## 2024-01-04 ENCOUNTER — Other Ambulatory Visit: Payer: Self-pay | Admitting: Nurse Practitioner

## 2024-01-07 NOTE — Telephone Encounter (Signed)
 Requested medication (s) are due for refill today: yes  Requested medication (s) are on the active medication list: yes  Last refill:  08/27/23  Future visit scheduled: no  Notes to clinic:  Unable to refill per protocol, cannot delegate.      Requested Prescriptions  Pending Prescriptions Disp Refills   methocarbamol  (ROBAXIN ) 500 MG tablet [Pharmacy Med Name: Methocarbamol  500 MG Oral Tablet] 30 tablet 0    Sig: TAKE 1 TABLET BY MOUTH EVERY 8 HOURS AS NEEDED FOR MUSCLE SPASM     Not Delegated - Analgesics:  Muscle Relaxants Failed - 01/07/2024 12:43 PM      Failed - This refill cannot be delegated      Failed - Valid encounter within last 6 months    Recent Outpatient Visits           6 months ago Seborrheic dermatitis   [facility] Herold Hadassah SQUIBB, MD   6 months ago Type 2 diabetes mellitus with proteinuria [facility])    [facility] [place], Melanie T, NP   9 years ago Otitis, externa, infective, left   [facility] at [facility], [place], [place]   9 years ago Dizziness   [facility] at [facility], Barnie NOVAK, FNP   9 years ago Dysuria   [facility] at [facility], Lamar SQUIBB, MD

## 2024-01-10 ENCOUNTER — Ambulatory Visit (INDEPENDENT_AMBULATORY_CARE_PROVIDER_SITE_OTHER): Admitting: Psychiatry

## 2024-01-10 DIAGNOSIS — F325 Major depressive disorder, single episode, in full remission: Secondary | ICD-10-CM | POA: Diagnosis not present

## 2024-01-10 DIAGNOSIS — F411 Generalized anxiety disorder: Secondary | ICD-10-CM | POA: Diagnosis not present

## 2024-01-10 DIAGNOSIS — E1169 Type 2 diabetes mellitus with other specified complication: Secondary | ICD-10-CM

## 2024-01-10 DIAGNOSIS — N951 Menopausal and female climacteric states: Secondary | ICD-10-CM

## 2024-01-10 DIAGNOSIS — R635 Abnormal weight gain: Secondary | ICD-10-CM

## 2024-01-10 DIAGNOSIS — Z630 Problems in relationship with spouse or partner: Secondary | ICD-10-CM | POA: Diagnosis not present

## 2024-01-10 DIAGNOSIS — Z566 Other physical and mental strain related to work: Secondary | ICD-10-CM

## 2024-01-10 NOTE — Progress Notes (Unsigned)
 Psychotherapy Progress Note [facility] [name], PhD LP  Patient ID: Stefanie Stewart)    MRN: [id number] Therapy format: {Therapy Types:21967::Individual psychotherapy} Date: 01/10/2024      Start: ***:***     Stop: ***:***     Time Spent: *** min Location: {SvcLoc:22530::In-person}   Session narrative (presenting needs, interim history, self-report of stressors and symptoms, applications of prior therapy, status changes, and interventions made in session) Fatigued, prone to hopscotch between many subjects again today.  Work has been demanding lately, a lot of hours and pace.  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  Out of the workout habit, gaining weight.  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  Also a friend of Jim's coming to visit and stay over next weekend, also without consulting.  To his credit, Signe is alcohol-free and smoke-free for 54 days now, following an interrupted alcohol rehab stay, and seems to be dedicated to sobriety.  A lot of unfinished projects and belongings laying around right now, between things from his house in [place] and his mother's estate.  Some edgy communications, like him asking her why she  always books appts when he's out of town -- based on things said a few months ago, and ongoing sexual issues, it signals suspicion she may be dolling up to cheat on him or leave him, but not said outright.  From her description, they continue to reckon with snap reactions to each other, defensiveness at times, and Meerab certainly is prone to either compulsively explain, take an impatient tone, or try to muzzle herself completely but still silently what's up with him.  Validated her fatigue and, as able, directed her to see that, whatever their issues are, they are still newlyweds at the same time they are survivors of relationships gone bad before.  They will both fall into self-protection mode plenty of times, and one important task still is to figure out how they will do problems without defensiveness, kitchen-sinking, whitewashing, or paranoia.  Another, especially for Han's sake as a recovering codependent, is to get straight how they will collaborate on added responsiblities, like hosting family or training and taking on a puppy.  Encouraged her to take the moment where she wants to say, Well YOU're the one who got the dog and turn it into WHEN you got the dog on your own, it set me up to ___ and to actually own the frustration she'll tend to speak in jabs instead of try to play like there's no feeling when sarcasm just made one obvious.  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  Complicated by other pitches to sell her house (the one she grew up in, and they live in) and move to [place], [place], because he dislikes [place] anyway.  Interpreted that she needs relief in one or more places, and some if it will come down to overall workload, some of it will come down to having  more control over where her own thoughts go and what responsibilities and expectations she feels she has to meet, because she can exaggerate, too.  Physiologically, disgusted by weight gain, getting about 18 lbs now, and is ready to go back on the [employer] plan that helped her before.  Still on Ozempic , but says it's not enough to keep her from her pattern of fasting long and snacking hard.  Probing medications, confesses she's been off Zoloft  and Wellbutrin  for months, thinking it might help her long-frustrated sexual and emotional response, but actually the problem long predates medication.  Hormonally, still menopausal, says she was never recommended to HRT, just a vaginal estradiol  cream, but turns out she is on norethindrone  BCPs to suppress periods.  Says she is on a bunch of supplements, with suggestion she should review those with a knowledgeable provider -- magnesium, turmeric, and cinnamon are listed, and she says she's been on vitamin D but suspended recently (testing found her extremely high, possibly for taking D3 plus a calcium  with D).  Also B12 (unknown amount), and some cognitive stuff (unknown ingredients, unknown if duplicating, possibly overdoing B12).  In light of the many interacting factors and unceratinties about duplications and unintended effects, offered that she could see a new colleague, Dr. Arleta Chatters, with expertise and experience managing supplements  Therapeutic modalities: {AM:23362::Cognitive Behavioral Therapy,Solution-Oriented/Positive Psychology}  Mental Status/Observations:  Appearance:   {PSY:22683}     Behavior:  {PSY:21022743}  Motor:  {PSY:22302}  Speech/Language:   {PSY:22685}  Affect:  {PSY:22687}  Mood:  {PSY:31886}  Thought process:  {PSY:31888}  Thought content:    {PSY:720-221-1936}  Sensory/Perceptual disturbances:    {PSY:336-777-3751}  Orientation:  {Psych Orientation:23301::Fully oriented}  Attention:  {Good-Fair-Poor  ratings:23770::Good}    Concentration:  {Good-Fair-Poor ratings:23770::Good}  Memory:  {PSY:8573763863}  Insight:    {Good-Fair-Poor ratings:23770::Good}  Judgment:   {Good-Fair-Poor ratings:23770::Good}  Impulse Control:  {Good-Fair-Poor ratings:23770::Good}   Risk Assessment: Danger to Self: {Risk:22599::No} Self-injurious Behavior: {Risk:22599::No} Danger to Others: {Risk:22599::No} Physical Aggression / Violence: {Risk:22599::No} Duty to Warn: {AMYesNo:22526::No} Access to Firearms a concern: {AMYesNo:22526::No}  Assessment of progress:  {Progress:22147::progressing}  Diagnosis: No diagnosis found. Plan:  *** Other recommendations/advice -- As may be noted above.  Continue to utilize previously learned skills ad lib. Medication compliance -- Maintain medication as prescribed and work faithfully with relevant prescriber(s) if any changes are desired or seem indicated. Crisis service -- Aware of call list and work-in appts.  Call the clinic on-call service, 988/hotline, 911, or present to [facility] or ER if any life-threatening psychiatric crisis. Followup -- No follow-ups on file.  Next scheduled visit with me 02/07/2024.  Next scheduled in this office 02/07/2024.  Lamar Kendall, PhD [name], PhD LP Clinical Psychologist, [facility] [address] [place], [place] [address] [phone]

## 2024-01-16 ENCOUNTER — Ambulatory Visit: Admitting: Emergency Medicine

## 2024-01-17 ENCOUNTER — Encounter: Payer: Self-pay | Admitting: Emergency Medicine

## 2024-01-17 ENCOUNTER — Ambulatory Visit: Admitting: Emergency Medicine

## 2024-01-17 VITALS — BP 125/80 | HR 95 | Ht 69.0 in | Wt 179.0 lb

## 2024-01-17 DIAGNOSIS — F325 Major depressive disorder, single episode, in full remission: Secondary | ICD-10-CM

## 2024-01-17 DIAGNOSIS — F411 Generalized anxiety disorder: Secondary | ICD-10-CM

## 2024-01-17 NOTE — Progress Notes (Signed)
 [facility] MD/PA/NP Initial Note  01/17/2024 11:29 AM Stefanie Stewart  MRN:  [id number]  Chief Complaint:  Chief Complaint   Establish Care; Depression; Anxiety; Other     HPI:  Stefanie Stewart is a 48 yo F presenting to clinic for new patient psychiatric evaluation, referred by clinical psychologist, Dr. [name] PhD for medication management and discussion regarding stress-related emotional/cortisol dysregulation.   Patient discontinued Zoloft  100 mg daily and Wellbutrin  XL 150 mg daily, without tapering, approximately 3 months ago due to anorgasmia. No withdrawal symptoms or other side effects reported. Currently not taking psych medications at this time Reports feeling generally well and has not noted any decline or worsening mood symptoms since discontinuation. Reports that she refers to continue utilizing coping strategies and continued therapy, along with consistent supplement intake, before considering medication re initiation.   Mood described as fatigued and overwhelmed by situational stressors; however, she remains future-oriented, motivated, self-aware and engaged in therapy. Reports she utilizes coping strategies and requested mental health day from her boss. Endorses disrupted sleep, increased appetite with cycles of fasting and binge snacking, and frustration over perceived loss of control of weight and energy.Notes recent 20 pound weight gain which she attributes to stress, reduced activity, and inconsistent medication adherence earlier in the year. Reports that she is not seeing full benefits of Ozempic . Plans to obtain gym membership and resume pilates.  Menopausal, on norethindrone  for cycle suppression and vaginal estradiol  cream; denies prior HRT. Continues Ozempic  1 mg and multiple supplements (magnesium, turmeric, cinnamon, B12 of unknown dose, previously vitamin D which was discontinued after elevated levels). Reports muscle tension and irritability but no tremor or  cardiac symptoms. Denies chest pain, palpitations, dizziness, or GI distress.  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 Work has been demanding, with extended hours and physical strain leading to cervical tension and intermittent unilateral numbness improved by massage.   Reports stress from new marital adjustments (married for 6 months); husband currently working out of town, recent alcohol recovery (2 months sober), and communication difficulties producing defensiveness and fatigue. Cares for a new puppy and manages household chores largely alone. Reports no safety concerns. She finds herself over thinking and sometimes ruminates on thoughts, however, it does not impair daily functioning. Denies panic attacks. Overall, she feels her mood is well managed.  Sees clinical psychologist Dr. Jodie Mitchum regularly; last visit 01/10/2024. Next visit - 02/07/2024  Denies SI, HI, or self-harm behaviors. No further complaints at this time.   Visit Diagnosis:    ICD-10-CM   1. Generalized anxiety disorder  F41.1     2. 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 Son 29 yr. Support system. Dental Assistant Social History   Socioeconomic History   Marital status: Married    Spouse name: Signe   Number of children: 1   Years of education: Not on file   Highest education level: Associate degree: occupational, Scientist, product/process development, or vocational program  Occupational History   Occupation: Sales executive  Tobacco Use   Smoking status: Former    Current packs/day: 0.00    Types: Cigarettes   Smokeless tobacco: Never  Vaping Use   Vaping status: Every Day   Substances: Nicotine, Flavoring   Devices: Zero Renvo  Substance and Sexual Activity   Alcohol use: Never   Drug use: Yes    Types: Marijuana    Comment: from time to time   Sexual activity: Yes    Birth control/protection: None  Other Topics Concern   Not on file  Social History Narrative   Not on file   Social Drivers of Health   Financial Resource Strain: Low Risk  (01/17/2024)   Overall Financial Resource Strain (CARDIA)    Difficulty of Paying Living Expenses: Not hard at all  Food Insecurity: No Food Insecurity (01/17/2024)   Hunger Vital Sign    Worried About Running Out of Food in the Last Year: Never true    Ran Out of Food in the Last Year: Never true  Transportation Needs: No Transportation Needs (01/17/2024)   PRAPARE - Administrator, Civil Service (Medical): No    Lack of Transportation (Non-Medical): No  Physical Activity: Inactive (02/07/2017)   Exercise Vital Sign    Days of Exercise per Week: 0 days    Minutes of Exercise per Session: 0 min  Stress: Stress Concern Present (01/17/2024)   [employer] of  Occupational Health - Occupational Stress Questionnaire    Feeling of Stress: To some extent  Social Connections: Moderately Isolated (02/07/2017)   Social Connection and Isolation Panel    Frequency of Communication with Friends and Family: More than three times a week    Frequency of Social Gatherings with Friends and Family: Once a week    Attends Religious Services: Never    Database administrator or Organizations: No    Attends Banker Meetings: Never    Marital Status: Divorced    Allergies:  Allergies  Allergen Reactions   Bactrim  [Sulfamethoxazole -Trimethoprim ] Itching    Itching (skin and throat). Denies SOB or throat closing.   Banana Other (See Comments)    Reports lip swelling. Every now and then pt states she has nausea and vomiting.    Metabolic Disorder Labs: Lab Results  Component Value Date   HGBA1C 5.3 06/14/2023   No results found for: PROLACTIN Lab Results  Component Value Date   CHOL 176 06/14/2023   TRIG 69 06/14/2023   HDL 43 06/14/2023   LDLCALC 120 (H) 06/14/2023   LDLCALC 80 11/16/2022   Lab Results  Component Value Date  TSH 1.630 03/30/2022   TSH 2.190 01/13/2021    Therapeutic Level Labs: No results found for: LITHIUM No results found for: VALPROATE No results found for: CBMZ  Current Medications: Current Outpatient Medications  Medication Sig Dispense Refill   desonide  (DESOWEN ) 0.05 % cream Apply topically 2 (two) times daily. 30 g 5   Evolocumab  (REPATHA  SURECLICK) 140 MG/ML SOAJ INJECT 140 MG INTO THE SKIN EVERY 14 (FOURTEEN) DAYS 3 mL 4   fexofenadine  (ALLEGRA  ALLERGY) 180 MG tablet Take 1 tablet (180 mg total) by mouth daily as needed for allergies or rhinitis.     ibuprofen  (ADVIL ) 600 MG tablet Take 1 tablet (600 mg total) by mouth every 6 (six) hours as needed. 30 tablet 0   ketoconazole  (NIZORAL ) 2 % cream APPLY 1 G OF CREAM TOPICALLY TO AREA OF RASH AS NEEDED 60 g 3   methocarbamol  (ROBAXIN ) 500 MG  tablet TAKE 1 TABLET BY MOUTH EVERY 8 HOURS AS NEEDED FOR MUSCLE SPASM 30 tablet 0   norethindrone  (AYGESTIN ) 5 MG tablet Take 1 tablet by mouth once daily 30 tablet 12   Semaglutide ,0.25 or 0.5MG /DOS, (OZEMPIC , 0.25 OR 0.5 MG/DOSE,) 2 MG/3ML SOPN Inject 0.5 mg into the skin once a week. 3 mL 4   TURMERIC PO Take 1 tablet by mouth daily.     blood glucose meter kit and supplies KIT Dispense based on patient and insurance preference. Use up to four times daily as directed. (FOR ICD-9 250.00, 250.01). 1 each 0   buPROPion  (WELLBUTRIN  XL) 150 MG 24 hr tablet Take 1 tablet by mouth once daily (Patient not taking: Reported on 01/17/2024) 180 tablet 0   cholecalciferol (VITAMIN D3) 25 MCG (1000 UNIT) tablet Take 1,000 Units by mouth daily. (Patient not taking: Reported on 01/17/2024)     Cinnamon 500 MG capsule Take 500 mg by mouth 2 (two) times daily. (Patient not taking: Reported on 01/17/2024)     sertraline  (ZOLOFT ) 100 MG tablet Take 1 tablet (100 mg total) by mouth daily. (Patient not taking: Reported on 01/17/2024) 90 tablet 4   No current facility-administered medications for this visit.    Medication Side Effects: none  Orders placed this visit:  No orders of the defined types were placed in this encounter.  Psychiatric Specialty Exam:  Review of Systems  Psychiatric/Behavioral:         Please refer to HPI.  All other systems reviewed and are negative.   Blood pressure 125/80, pulse 95, height 5' 9 (1.753 m), weight 179 lb (81.2 kg).Body mass index is 26.43 kg/m.  General Appearance: Well Groomed  Eye Contact:  Good  Speech:  Normal Rate  Volume:  Normal  Mood:  Euthymic  Affect:  Appropriate  Thought Process:  Coherent, Goal Directed, and Linear  Orientation:  Full (Time, Place, and Person)  Thought Content: WDL   Suicidal Thoughts:  No  Homicidal Thoughts:  No  Memory:  WNL  Judgement:  Good  Insight:  Good  Psychomotor Activity:  Normal  Concentration:  Concentration:  Good  Recall:  Good  Fund of Knowledge: Good  Language: Good  Assets:  Desire for Improvement Financial Resources/Insurance Social Support Transportation Vocational/Educational  ADL's:  Intact  Cognition: WNL  Prognosis:  Good   Screenings:  GAD-7    Flowsheet Row Office Visit from 01/17/2024 in [facility] Office Visit from 06/14/2023 in [facility] Office Visit from 02/08/2023 in [facility] Office Visit from  11/16/2022 in [facility] Office Visit from 07/27/2022 in [facility]  Total GAD-7 Score 3 9 11 12 16    PHQ2-9    Flowsheet Row Office Visit from 01/17/2024 in [facility] Office Visit from 06/14/2023 in [facility] Office Visit from 02/08/2023 in [facility] Office Visit from 11/16/2022 in [facility] Office Visit from 07/27/2022 in [facility]  PHQ-2 Total Score 0 0 0 0 1  PHQ-9 Total Score -- 7 7 3 5    Flowsheet Row ED from 11/30/2020 in [facility] at [facility]  C-SSRS RISK CATEGORY No Risk    Receiving Psychotherapy: Yes ; Sees clinical psychologist Dr. [name] - next scheduled visit 02/07/2024.  Treatment Plan/Recommendations:   I provided approximately 60 minutes of face to face time during this encounter, including time spent before and after the visit in records review, medical decision making, counseling pertinent to today's visit, and charting.    Discussed dx and tx plan.   GAD/MDD - controlled  No pharmacologic initiation at this time per patient request and clinical judgement Patient discontinued Zoloft  100 mg daily and Wellbutrin  XL 150 mg daily, without tapering, approximately 3 months ago due to anorgasmia. No withdrawal symptoms or other SE reported Will consider starting  Cymbalta 30 mg daily at FU, if mood symptoms reappear or worsen Continuation of psychotherapy with clinical psychologist, Dr. [name] for ongoing development and reinforcement of active coping strategies, stress management skills, and relapse prevention Emphasized the importance of supporting healthy cortisol regulation through therapeutic lifestyle modifications -  structured routines, adequate sleep, regular exercise of at least 3x a week for 30 min, incorporating healthier diet options with a focus on balanced nutrition and reduced processed foods/gluten free diet to reduce inflammation, and considering appropriate supplementation including multi-vitamin, vitamin d, b 12, magnesium glycinate, iron, and ashwagandha to support overall well-being Recommended mindfulness-based stress reduction and coping strategies.  FU - 4 weeks or sooner if clinically indicated.   Patient engaged in shared decision-making;treatment plan reviewed and agreed upon.     Aven Cegielski, PA-C

## 2024-02-07 ENCOUNTER — Ambulatory Visit: Admitting: Psychiatry

## 2024-02-14 ENCOUNTER — Ambulatory Visit: Admitting: Emergency Medicine

## 2024-03-06 ENCOUNTER — Ambulatory Visit: Admitting: Psychiatry

## 2024-03-15 ENCOUNTER — Other Ambulatory Visit: Payer: Self-pay | Admitting: Nurse Practitioner

## 2024-03-27 ENCOUNTER — Ambulatory Visit: Payer: BC Managed Care – PPO | Admitting: Psychiatry

## 2024-04-17 ENCOUNTER — Ambulatory Visit: Admitting: Psychiatry
